# Patient Record
Sex: Female | Born: 1955 | Race: White | Hispanic: No | Marital: Married | Smoking: Never smoker
Health system: Southern US, Community
[De-identification: ages and names within clinical notes are randomized; demographics above are authoritative.]

## PROBLEM LIST (undated history)

## (undated) DIAGNOSIS — E785 Hyperlipidemia, unspecified: Secondary | ICD-10-CM

## (undated) DIAGNOSIS — I10 Essential (primary) hypertension: Secondary | ICD-10-CM

## (undated) DIAGNOSIS — B019 Varicella without complication: Secondary | ICD-10-CM

## (undated) DIAGNOSIS — N189 Chronic kidney disease, unspecified: Secondary | ICD-10-CM

## (undated) DIAGNOSIS — L0293 Carbuncle, unspecified: Secondary | ICD-10-CM

## (undated) DIAGNOSIS — L0292 Furuncle, unspecified: Secondary | ICD-10-CM

## (undated) DIAGNOSIS — N200 Calculus of kidney: Secondary | ICD-10-CM

## (undated) DIAGNOSIS — G709 Myoneural disorder, unspecified: Secondary | ICD-10-CM

## (undated) DIAGNOSIS — K219 Gastro-esophageal reflux disease without esophagitis: Secondary | ICD-10-CM

## (undated) DIAGNOSIS — E079 Disorder of thyroid, unspecified: Secondary | ICD-10-CM

## (undated) HISTORY — DX: Varicella without complication: B01.9

## (undated) HISTORY — PX: COSMETIC SURGERY: SHX468

## (undated) HISTORY — DX: Carbuncle, unspecified: L02.93

## (undated) HISTORY — DX: Essential (primary) hypertension: I10

## (undated) HISTORY — PX: BREAST SURGERY: SHX581

## (undated) HISTORY — DX: Disorder of thyroid, unspecified: E07.9

## (undated) HISTORY — PX: BIOPSY THYROID: PRO38

## (undated) HISTORY — PX: TUBAL LIGATION: SHX77

## (undated) HISTORY — PX: BREAST REDUCTION SURGERY: SHX8

## (undated) HISTORY — DX: Hyperlipidemia, unspecified: E78.5

## (undated) HISTORY — DX: Calculus of kidney: N20.0

## (undated) HISTORY — PX: ABDOMINAL HYSTERECTOMY: SHX81

## (undated) HISTORY — DX: Myoneural disorder, unspecified: G70.9

## (undated) HISTORY — DX: Gastro-esophageal reflux disease without esophagitis: K21.9

## (undated) HISTORY — DX: Furuncle, unspecified: L02.92

## (undated) HISTORY — PX: EYE SURGERY: SHX253

---

## 1996-01-13 HISTORY — PX: ABDOMINAL HYSTERECTOMY: SHX81

## 2009-12-24 ENCOUNTER — Ambulatory Visit: Payer: Self-pay | Admitting: Internal Medicine

## 2010-01-12 LAB — HM MAMMOGRAPHY

## 2011-09-08 ENCOUNTER — Encounter: Payer: Self-pay | Admitting: Internal Medicine

## 2011-09-08 ENCOUNTER — Other Ambulatory Visit: Payer: Self-pay | Admitting: Internal Medicine

## 2011-09-08 ENCOUNTER — Ambulatory Visit (INDEPENDENT_AMBULATORY_CARE_PROVIDER_SITE_OTHER): Payer: No Typology Code available for payment source | Admitting: Internal Medicine

## 2011-09-08 VITALS — BP 120/70 | HR 59 | Temp 98.7°F | Ht 68.5 in | Wt 220.0 lb

## 2011-09-08 DIAGNOSIS — E039 Hypothyroidism, unspecified: Secondary | ICD-10-CM | POA: Insufficient documentation

## 2011-09-08 DIAGNOSIS — E785 Hyperlipidemia, unspecified: Secondary | ICD-10-CM

## 2011-09-08 DIAGNOSIS — E782 Mixed hyperlipidemia: Secondary | ICD-10-CM | POA: Insufficient documentation

## 2011-09-08 DIAGNOSIS — E1140 Type 2 diabetes mellitus with diabetic neuropathy, unspecified: Secondary | ICD-10-CM | POA: Insufficient documentation

## 2011-09-08 DIAGNOSIS — E1149 Type 2 diabetes mellitus with other diabetic neurological complication: Secondary | ICD-10-CM

## 2011-09-08 DIAGNOSIS — E1142 Type 2 diabetes mellitus with diabetic polyneuropathy: Secondary | ICD-10-CM

## 2011-09-08 DIAGNOSIS — Z1239 Encounter for other screening for malignant neoplasm of breast: Secondary | ICD-10-CM

## 2011-09-08 DIAGNOSIS — L03211 Cellulitis of face: Secondary | ICD-10-CM

## 2011-09-08 DIAGNOSIS — IMO0001 Reserved for inherently not codable concepts without codable children: Secondary | ICD-10-CM

## 2011-09-08 DIAGNOSIS — IMO0002 Reserved for concepts with insufficient information to code with codable children: Secondary | ICD-10-CM

## 2011-09-08 DIAGNOSIS — L0201 Cutaneous abscess of face: Secondary | ICD-10-CM

## 2011-09-08 DIAGNOSIS — I10 Essential (primary) hypertension: Secondary | ICD-10-CM

## 2011-09-08 DIAGNOSIS — E1165 Type 2 diabetes mellitus with hyperglycemia: Secondary | ICD-10-CM | POA: Insufficient documentation

## 2011-09-08 MED ORDER — DOXYCYCLINE HYCLATE 100 MG PO TABS: 100.0000 mg | ORAL_TABLET | Freq: Two times a day (BID) | ORAL | Status: AC

## 2011-09-08 NOTE — Assessment & Plan Note (Signed)
Patient reports neuropathic pain in her legs. Most consistent with diabetic neuropathy. Encouraged her continued efforts at better compliance with diet and improved control of her blood sugars. Also discussed potentially using Neurontin, however she would like to hold off on this for now.

## 2011-09-08 NOTE — Assessment & Plan Note (Signed)
Blood pressure well-controlled on current medications. Will obtain records on recent renal function. Followup in one week.

## 2011-09-08 NOTE — Assessment & Plan Note (Addendum)
Continue pravastatin. Goal LDL less than 70. Will obtain records on previous lipids and LFTs.

## 2011-09-08 NOTE — Assessment & Plan Note (Signed)
Patient reports last hemoglobin A1c was 7.5%. Will obtain records on this. She reports recent better compliance with diet and weight loss of 13 pounds. Encouraged her to continue with this. Encouraged her to monitor sugars closely as she loses weight. Followup one week.

## 2011-09-08 NOTE — Progress Notes (Signed)
Subjective:    Patient ID: Christie Bryant, female    DOB: 1955-12-10, 56 y.o.   MRN: [id number]  HPI 56 year old female with history of diabetes, hypertension, hyperlipidemia presents to establish care. Her primary concern today is recurrent episodes of boils. She reports history of several boils on her legs and abdomen. These first began to occur approximately one year ago. 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 After that time, she developed recurrent boils. She has been treated with Bactrim and doxycycline with resolution of her symptoms. She reports that one boil was cultured and it was staph but she is not sure if it was MRSA. Over the last couple of days she has developed a boil behind her left ear. This area has been draining some purulent fluid. She has been applying bacitracin topically with some improvement in surrounding redness. She denies any fever or chills.  In regards to diabetes, she reports that recent A1c was elevated at 7.5%. She notes some dietary indiscretion the past. Over the last couple of months, she has started on Weight Watchers and has lost about 12 pounds. She reports that blood sugars have been improved, typically near 80 or 90 fasting. She reports full compliance with her medications.  In regards to hypertension, she reports that her endocrinologist recently reduced her dose of triamterene. She has had some dizziness and low blood pressures on this medication. She reports that symptoms have resolved and she is tolerating medication well. Next  She is also concerned about some burning pain in her feet. This is most bothersome at night. She has not had wounds on her feet. She is not currently taking any medication for this.  Outpatient Encounter Prescriptions as of 09/08/2011  Medication Sig Dispense Refill  . aspirin 81 MG tablet Take 81 mg by mouth daily.      . Cholecalciferol (VITAMIN D-3) 1000  UNITS CAPS Take 2 capsules by mouth daily.      Marland Kitchen doxycycline (VIBRA-TABS) 100 MG tablet Take 1 tablet (100 mg total) by mouth 2 (two) times daily.  60 tablet  0  . glimepiride (AMARYL) 4 MG tablet Take 4 mg by mouth 2 (two) times daily.      Marland Kitchen levothyroxine (SYNTHROID, LEVOTHROID) 75 MCG tablet Take 75 mcg by mouth daily.      . metFORMIN (GLUCOPHAGE-XR) 500 MG 24 hr tablet Take 500 mg by mouth 2 (two) times daily.      . metoprolol (LOPRESSOR) 100 MG tablet Take 100 mg by mouth 2 (two) times daily.      . Multiple Vitamin (MULTIVITAMIN) tablet Take 1 tablet by mouth daily.      . pravastatin (PRAVACHOL) 40 MG tablet Take 40 mg by mouth daily.      . sitaGLIPtin (JANUVIA) 100 MG tablet Take 100 mg by mouth daily.      Marland Kitchen triamterene-hydrochlorothiazide (MAXZIDE) 75-50 MG per tablet Take 0.5 tablets by mouth daily.      . vitamin C (ASCORBIC ACID) 500 MG tablet Take 500 mg by mouth daily.       BP 120/70  Pulse 59  Temp 98.7 F (37.1 C) (Oral)  Ht 5' 8.5" (1.74 m)  Wt 220 lb (99.791 kg)  BMI 32.96 kg/m2  SpO2 97%  Review of Systems  Constitutional: Negative for fever, chills, appetite change, fatigue and unexpected weight change.  HENT: Negative for ear pain, congestion, sore throat, trouble swallowing, neck pain, voice  change and sinus pressure.   Eyes: Negative for visual disturbance.  Respiratory: Negative for cough, shortness of breath, wheezing and stridor.   Cardiovascular: Negative for chest pain, palpitations and leg swelling.  Gastrointestinal: Negative for nausea, vomiting, abdominal pain, diarrhea, constipation, blood in stool, abdominal distention and anal bleeding.  Genitourinary: Negative for dysuria and flank pain.  Musculoskeletal: Positive for myalgias. Negative for arthralgias and gait problem.  Skin: Positive for color change and wound. Negative for rash.  Neurological: Negative for dizziness and headaches.  Hematological: Negative for adenopathy. Does not  bruise/bleed easily.  Psychiatric/Behavioral: Negative for suicidal ideas, disturbed wake/sleep cycle and dysphoric mood. The patient is not nervous/anxious.        Objective:   Physical Exam  Constitutional: She is oriented to person, place, and time. She appears well-developed and well-nourished. No distress.  HENT:  Head: Normocephalic and atraumatic.    Right Ear: External ear normal.  Left Ear: External ear normal.  Nose: Nose normal.  Mouth/Throat: Oropharynx is clear and moist. No oropharyngeal exudate.  Eyes: Conjunctivae are normal. Pupils are equal, round, and reactive to light. Right eye exhibits no discharge. Left eye exhibits no discharge. No scleral icterus.  Neck: Normal range of motion. Neck supple. No tracheal deviation present. No thyromegaly present.  Cardiovascular: Normal rate, regular rhythm, normal heart sounds and intact distal pulses.  Exam reveals no gallop and no friction rub.   No murmur heard. Pulmonary/Chest: Effort normal and breath sounds normal. No respiratory distress. She has no wheezes. She has no rales. She exhibits no tenderness.  Abdominal: Soft. Bowel sounds are normal. She exhibits no distension. There is no tenderness.  Musculoskeletal: Normal range of motion. She exhibits no edema and no tenderness.  Lymphadenopathy:    She has no cervical adenopathy.  Neurological: She is alert and oriented to person, place, and time. No cranial nerve deficit. She exhibits normal muscle tone. Coordination normal.  Skin: Skin is warm and dry. No rash noted. She is not diaphoretic. No erythema. No pallor.  Psychiatric: She has a normal mood and affect. Her behavior is normal. Judgment and thought content normal.          Assessment & Plan:

## 2011-09-08 NOTE — Assessment & Plan Note (Signed)
Will obtain records on recent thyroid function. Continue Synthroid. Followup next week.

## 2011-09-08 NOTE — Assessment & Plan Note (Signed)
Cellulitis of scalp posterior to left ear. Given history of recurrent boils this is most consistent with MRSA. Culture sent today. Will treat with doxycycline. Patient will followup in one week. She will call sooner if symptoms are not improving.

## 2011-09-08 NOTE — Assessment & Plan Note (Signed)
Screening mammogram ordered today ?

## 2011-09-10 ENCOUNTER — Encounter: Payer: Self-pay | Admitting: Internal Medicine

## 2011-09-11 LAB — WOUND CULTURE

## 2011-09-16 ENCOUNTER — Encounter: Payer: Self-pay | Admitting: Internal Medicine

## 2011-09-16 ENCOUNTER — Ambulatory Visit (INDEPENDENT_AMBULATORY_CARE_PROVIDER_SITE_OTHER): Payer: No Typology Code available for payment source | Admitting: Internal Medicine

## 2011-09-16 VITALS — BP 118/70 | HR 65 | Temp 98.7°F | Ht 68.5 in | Wt 217.8 lb

## 2011-09-16 DIAGNOSIS — L03211 Cellulitis of face: Secondary | ICD-10-CM

## 2011-09-16 DIAGNOSIS — A4902 Methicillin resistant Staphylococcus aureus infection, unspecified site: Secondary | ICD-10-CM

## 2011-09-16 DIAGNOSIS — L0201 Cutaneous abscess of face: Secondary | ICD-10-CM

## 2011-09-16 NOTE — Assessment & Plan Note (Signed)
Patient with history of MRSA and recurrent cellulitis and abscess. Will set up evaluation with ID to see if she might be a candidate for decontamination therapy.

## 2011-09-16 NOTE — Progress Notes (Signed)
Subjective:    Patient ID: Christie Bryant, female    DOB: 03/14/1955, 56 y.o.   MRN: [id number]  HPI 56 year old female with history of diabetes, hypertension, hyperlipidemia, and hypothyroidism presents for followup of abscess posterior to her left ear. At her last visit, she was started on doxycycline. Culture from the wound grew MRSA which was sensitive to doxycycline. She reports significant improvement in redness and pain as well as swelling at the site. She denies any fever or chills. She reports she is generally feeling well.  Outpatient Encounter Prescriptions as of 09/16/2011  Medication Sig Dispense Refill  . aspirin 81 MG tablet Take 81 mg by mouth daily.      . Cholecalciferol (VITAMIN D-3) 1000 UNITS CAPS Take 2 capsules by mouth daily.      Marland Kitchen doxycycline (VIBRA-TABS) 100 MG tablet Take 1 tablet (100 mg total) by mouth 2 (two) times daily.  60 tablet  0  . glimepiride (AMARYL) 4 MG tablet Take 4 mg by mouth 2 (two) times daily.      Marland Kitchen levothyroxine (SYNTHROID, LEVOTHROID) 75 MCG tablet Take 75 mcg by mouth daily.      . metFORMIN (GLUCOPHAGE-XR) 500 MG 24 hr tablet Take 500 mg by mouth 2 (two) times daily.      . metoprolol (LOPRESSOR) 100 MG tablet Take 100 mg by mouth 2 (two) times daily.      . Multiple Vitamin (MULTIVITAMIN) tablet Take 1 tablet by mouth daily.      . pravastatin (PRAVACHOL) 40 MG tablet Take 40 mg by mouth daily.      . sitaGLIPtin (JANUVIA) 100 MG tablet Take 100 mg by mouth daily.      Marland Kitchen triamterene-hydrochlorothiazide (MAXZIDE) 75-50 MG per tablet Take 0.5 tablets by mouth daily.      . vitamin C (ASCORBIC ACID) 500 MG tablet Take 500 mg by mouth daily.      Marland Kitchen sulfamethoxazole-trimethoprim (BACTRIM DS) 800-160 MG per tablet        BP 118/70  Pulse 65  Temp 98.7 F (37.1 C) (Oral)  Ht 5' 8.5" (1.74 m)  Wt 217 lb 12 oz (98.771 kg)  BMI 32.63 kg/m2  SpO2 95%  Review of Systems  Constitutional: Negative for fever, chills, appetite change, fatigue and  unexpected weight change.  HENT: Negative for ear pain, congestion, sore throat, trouble swallowing, neck pain, voice change and sinus pressure.   Eyes: Negative for visual disturbance.  Respiratory: Negative for cough, shortness of breath, wheezing and stridor.   Cardiovascular: Negative for chest pain, palpitations and leg swelling.  Gastrointestinal: Negative for nausea, vomiting, abdominal pain, diarrhea, constipation, blood in stool, abdominal distention and anal bleeding.  Genitourinary: Negative for dysuria and flank pain.  Musculoskeletal: Negative for myalgias, arthralgias and gait problem.  Skin: Positive for color change and wound. Negative for rash.  Neurological: Negative for dizziness and headaches.  Hematological: Negative for adenopathy. Does not bruise/bleed easily.  Psychiatric/Behavioral: Negative for suicidal ideas, disturbed wake/sleep cycle and dysphoric mood. The patient is not nervous/anxious.        Objective:   Physical Exam  Constitutional: She is oriented to person, place, and time. She appears well-developed and well-nourished. No distress.  HENT:  Head: Normocephalic and atraumatic.    Right Ear: External ear normal.  Left Ear: External ear normal.  Nose: Nose normal.  Mouth/Throat: Oropharynx is clear and moist. No oropharyngeal exudate.  Eyes: Conjunctivae are normal. Pupils are equal, round, and reactive to light. Right eye exhibits  no discharge. Left eye exhibits no discharge. No scleral icterus.  Neck: Normal range of motion. Neck supple. No tracheal deviation present. No thyromegaly present.  Cardiovascular: Normal rate, regular rhythm, normal heart sounds and intact distal pulses.  Exam reveals no gallop and no friction rub.   No murmur heard. Pulmonary/Chest: Effort normal and breath sounds normal. No respiratory distress. She has no wheezes. She has no rales. She exhibits no tenderness.  Musculoskeletal: Normal range of motion. She exhibits no  edema and no tenderness.  Lymphadenopathy:    She has no cervical adenopathy.  Neurological: She is alert and oriented to person, place, and time. No cranial nerve deficit. She exhibits normal muscle tone. Coordination normal.  Skin: Skin is warm and dry. No rash noted. She is not diaphoretic. No erythema. No pallor.  Psychiatric: She has a normal mood and affect. Her behavior is normal. Judgment and thought content normal.          Assessment & Plan:

## 2011-09-16 NOTE — Assessment & Plan Note (Signed)
Symptoms of cellulitis and abscess or markedly improved on antibiotics. Culture showed MRSA which was sensitive to both doxycycline and Bactrim. We'll plan to continue for another full week of antibiotics. She will call if symptoms are recurrent.

## 2011-09-21 ENCOUNTER — Encounter: Payer: Self-pay | Admitting: Internal Medicine

## 2011-10-20 ENCOUNTER — Encounter: Payer: Self-pay | Admitting: Internal Medicine

## 2011-12-28 ENCOUNTER — Encounter: Payer: Self-pay | Admitting: Internal Medicine

## 2011-12-28 ENCOUNTER — Ambulatory Visit (INDEPENDENT_AMBULATORY_CARE_PROVIDER_SITE_OTHER): Payer: No Typology Code available for payment source | Admitting: Internal Medicine

## 2011-12-28 VITALS — BP 128/76 | HR 59 | Temp 98.0°F | Resp 16 | Wt 197.5 lb

## 2011-12-28 DIAGNOSIS — B029 Zoster without complications: Secondary | ICD-10-CM | POA: Insufficient documentation

## 2011-12-28 MED ORDER — HYDROCODONE-ACETAMINOPHEN 5-325 MG PO TABS: 1.0000 | ORAL_TABLET | Freq: Three times a day (TID) | ORAL | Status: DC | PRN

## 2011-12-28 MED ORDER — VALACYCLOVIR HCL 1 G PO TABS: 1000.0000 mg | ORAL_TABLET | Freq: Three times a day (TID) | ORAL | Status: DC

## 2011-12-28 MED ORDER — GABAPENTIN 100 MG PO CAPS: 100.0000 mg | ORAL_CAPSULE | Freq: Three times a day (TID) | ORAL | Status: DC

## 2011-12-28 NOTE — Patient Instructions (Signed)

## 2011-12-28 NOTE — Assessment & Plan Note (Signed)
Symptoms and exam are consistent with herpes zoster. Will treat with Valtrex given onset of symptoms less than 72 hours. Will use Neurontin for pain. We discussed that we will need to gradually titrate dose of this medication. Will also use hydrocodone as needed for severe pain. Patient will e-mail with update later this week. Followup in 2 weeks.

## 2011-12-28 NOTE — Progress Notes (Signed)
Subjective:    Patient ID: Christie Bryant, female    DOB: January 01, 1956, 56 y.o.   MRN: [id number]  HPI 56 year old female with history of diabetes, hyperlipidemia, hypothyroidism, hypertension presents for acute visit complaining of rash over her right upper abdomen. She reports that she first developed pain on Friday. She was concerned that her right upper quadrant abdominal pain might be related to gallbladder dysfunction. However, over the next 24 hours, she developed a red, raised, painful rash in this area. The rash extends from the midline around her right abdomen across her right upper back. It is described as incredibly painful, like a knife stabbing her. She has not taken any medication for this. She also reports some general malaise and myalgia. She denies any fever, chills.  Outpatient Encounter Prescriptions as of 12/28/2011  Medication Sig Dispense Refill  . aspirin 81 MG tablet Take 81 mg by mouth daily.      . Cholecalciferol (VITAMIN D-3) 1000 UNITS CAPS Take 2 capsules by mouth daily.      Marland Kitchen glimepiride (AMARYL) 4 MG tablet Take 4 mg by mouth 2 (two) times daily.      Marland Kitchen levothyroxine (SYNTHROID, LEVOTHROID) 75 MCG tablet Take 75 mcg by mouth daily.      . metFORMIN (GLUCOPHAGE-XR) 500 MG 24 hr tablet Take 500 mg by mouth 2 (two) times daily.      . metoprolol (LOPRESSOR) 100 MG tablet Take 100 mg by mouth 2 (two) times daily.      . Multiple Vitamin (MULTIVITAMIN) tablet Take 1 tablet by mouth daily.      . pravastatin (PRAVACHOL) 40 MG tablet Take 40 mg by mouth daily.      . sitaGLIPtin (JANUVIA) 100 MG tablet Take 100 mg by mouth daily.      Marland Kitchen sulfamethoxazole-trimethoprim (BACTRIM DS) 800-160 MG per tablet       . triamterene-hydrochlorothiazide (MAXZIDE) 75-50 MG per tablet Take 0.5 tablets by mouth daily.      . vitamin C (ASCORBIC ACID) 500 MG tablet Take 500 mg by mouth daily.      Marland Kitchen gabapentin (NEURONTIN) 100 MG capsule Take 1 capsule (100 mg total) by mouth 3 (three) times  daily.  90 capsule  3    Review of Systems  Constitutional: Positive for fatigue. Negative for fever, chills, appetite change and unexpected weight change.  HENT: Negative for ear pain, congestion, sore throat, trouble swallowing, neck pain, voice change and sinus pressure.   Eyes: Negative for visual disturbance.  Respiratory: Negative for cough, shortness of breath, wheezing and stridor.   Cardiovascular: Negative for chest pain, palpitations and leg swelling.  Gastrointestinal: Positive for abdominal pain. Negative for nausea, vomiting, diarrhea, constipation, blood in stool, abdominal distention and anal bleeding.  Genitourinary: Negative for dysuria and flank pain.  Musculoskeletal: Positive for myalgias. Negative for arthralgias and gait problem.  Skin: Positive for color change and rash.  Neurological: Negative for dizziness and headaches.  Hematological: Negative for adenopathy. Does not bruise/bleed easily.  Psychiatric/Behavioral: Negative for suicidal ideas, sleep disturbance and dysphoric mood. The patient is not nervous/anxious.        Objective:   Physical Exam  Constitutional: She is oriented to person, place, and time. She appears well-developed and well-nourished. No distress.  HENT:  Head: Normocephalic and atraumatic.  Right Ear: External ear normal.  Left Ear: External ear normal.  Nose: Nose normal.  Mouth/Throat: Oropharynx is clear and moist. No oropharyngeal exudate.  Eyes: Conjunctivae normal are normal. Pupils are  equal, round, and reactive to light. Right eye exhibits no discharge. Left eye exhibits no discharge. No scleral icterus.  Neck: Normal range of motion. Neck supple. No tracheal deviation present. No thyromegaly present.  Pulmonary/Chest: Effort normal.  Abdominal: There is tenderness.    Musculoskeletal: Normal range of motion. She exhibits no edema and no tenderness.  Lymphadenopathy:    She has no cervical adenopathy.  Neurological: She is  alert and oriented to person, place, and time. No cranial nerve deficit. She exhibits normal muscle tone. Coordination normal.  Skin: Skin is warm and dry. Rash noted. Rash is vesicular. She is not diaphoretic. No erythema. No pallor.  Psychiatric: She has a normal mood and affect. Her behavior is normal. Judgment and thought content normal.          Assessment & Plan:

## 2012-01-11 ENCOUNTER — Encounter: Payer: Self-pay | Admitting: Internal Medicine

## 2012-01-12 ENCOUNTER — Ambulatory Visit: Payer: No Typology Code available for payment source | Admitting: Internal Medicine

## 2012-02-27 ENCOUNTER — Other Ambulatory Visit: Payer: Self-pay

## 2012-07-26 ENCOUNTER — Encounter: Payer: Self-pay | Admitting: Internal Medicine

## 2012-08-02 ENCOUNTER — Telehealth: Payer: Self-pay | Admitting: Internal Medicine

## 2012-08-02 NOTE — Telephone Encounter (Signed)
Fwd to Dr. Walker 

## 2012-08-02 NOTE — Telephone Encounter (Signed)
Pt asking for permission to make an appointment for her Christie Bryant to see Dr. Dan Humphreys as a new pt.  Christie Bryant has been diagnosed with adult ADHD; has no other medical issues.  His Dr. office closed, so he needs a new PCP pretty quick because he is about out of medication.  Please advise.

## 2012-08-02 NOTE — Telephone Encounter (Signed)
That is fine. We can put him in next slot.

## 2012-08-03 NOTE — Telephone Encounter (Signed)
Appt has been scheduled 7/25 for [facility]" [place] 7/25.  [place] stated he would run out of his wellbutrin w/in the next week.

## 2012-08-03 NOTE — Telephone Encounter (Signed)
Noted  

## 2012-09-14 ENCOUNTER — Encounter: Payer: Self-pay | Admitting: Internal Medicine

## 2012-11-17 ENCOUNTER — Other Ambulatory Visit: Payer: Self-pay

## 2012-11-20 LAB — HM DIABETES EYE EXAM

## 2012-11-21 ENCOUNTER — Encounter: Payer: Self-pay | Admitting: Internal Medicine

## 2012-11-23 ENCOUNTER — Encounter: Payer: Self-pay | Admitting: Internal Medicine

## 2012-11-23 MED ORDER — METOPROLOL TARTRATE 100 MG PO TABS: 100.0000 mg | ORAL_TABLET | Freq: Two times a day (BID) | ORAL | Status: DC

## 2012-12-05 ENCOUNTER — Ambulatory Visit (INDEPENDENT_AMBULATORY_CARE_PROVIDER_SITE_OTHER): Payer: No Typology Code available for payment source | Admitting: Internal Medicine

## 2012-12-05 ENCOUNTER — Encounter: Payer: Self-pay | Admitting: Internal Medicine

## 2012-12-05 VITALS — BP 112/66 | HR 58 | Temp 98.2°F | Resp 12 | Ht 69.0 in | Wt 213.0 lb

## 2012-12-05 DIAGNOSIS — Z0000 Encounter for general adult medical examination without abnormal findings: Secondary | ICD-10-CM

## 2012-12-05 LAB — LIPID PANEL
Cholesterol: 203 mg/dL — ABNORMAL HIGH (ref 0–200)
HDL: 47.1 mg/dL (ref >39.00)
Triglycerides: 131 mg/dL (ref 0.0–149.0)

## 2012-12-05 LAB — COMPREHENSIVE METABOLIC PANEL
ALT: 26 U/L (ref 0–35)
AST: 21 U/L (ref 0–37)
Albumin: 4 g/dL (ref 3.5–5.2)
Alkaline Phosphatase: 80 U/L (ref 39–117)
BUN: 21 mg/dL (ref 6–23)
CO2: 28 mEq/L (ref 19–32)
Calcium: 9.9 mg/dL (ref 8.4–10.5)
Chloride: 103 mEq/L (ref 96–112)
Creatinine, Ser: 0.9 mg/dL (ref 0.4–1.2)
GFR: 71.72 mL/min (ref >60.00)
Potassium: 4.5 mEq/L (ref 3.5–5.1)
Total Bilirubin: 0.9 mg/dL (ref 0.3–1.2)

## 2012-12-05 LAB — CBC WITH DIFFERENTIAL/PLATELET
Basophils Relative: 0.3 % (ref 0.0–3.0)
Eosinophils Absolute: 0.5 10*3/uL (ref 0.0–0.7)
HCT: 41.8 % (ref 36.0–46.0)
Hemoglobin: 14.4 g/dL (ref 12.0–15.0)
Lymphocytes Relative: 28.9 % (ref 12.0–46.0)
Lymphs Abs: 2.3 10*3/uL (ref 0.7–4.0)
MCHC: 34.5 g/dL (ref 30.0–36.0)
Monocytes Absolute: 0.5 10*3/uL (ref 0.1–1.0)
Monocytes Relative: 6.8 % (ref 3.0–12.0)
Neutro Abs: 4.7 10*3/uL (ref 1.4–7.7)
RBC: 4.42 Mil/uL (ref 3.87–5.11)

## 2012-12-05 LAB — HEMOGLOBIN A1C: Hgb A1c MFr Bld: 7.3 % — ABNORMAL HIGH (ref 4.6–6.5)

## 2012-12-05 LAB — LDL CHOLESTEROL, DIRECT: Direct LDL: 142.6 mg/dL

## 2012-12-05 LAB — TSH: TSH: 7.91 u[IU]/mL — ABNORMAL HIGH (ref 0.35–5.50)

## 2012-12-05 NOTE — Progress Notes (Signed)
Pre visit review using our clinic review tool, if applicable. No additional management support is needed unless otherwise documented below in the visit note. 

## 2012-12-05 NOTE — Assessment & Plan Note (Signed)
General medical exam including breast and pelvic exam normal today. PAP pending. Mammogram ordered. Immunizations are UTD except for Zostavax, which pt declines at this time. Will check basic labs including CMP, CBC, TSH, A1c. Encouraged healthy diet and regular exercise. Follow up 3 months and prn.

## 2012-12-05 NOTE — Patient Instructions (Signed)

## 2012-12-05 NOTE — Progress Notes (Signed)
Subjective:    Patient ID: Christie Bryant, female    DOB: Jun 29, 1955, 57 y.o.   MRN: [id number]  HPI 57YO female with DM, HTN, hypothyroidism presents for annual exam. She is generally feeling well. She notes decreased compliance with healthy diet recently secondary to pending move. She did not bring record of her BP today. She is compliant with medications.  Outpatient Encounter Prescriptions as of 12/05/2012  Medication Sig  . aspirin 325 MG tablet Take 325 mg by mouth daily.  . Cholecalciferol (VITAMIN D-3) 1000 UNITS CAPS Take 2 capsules by mouth daily.  Marland Kitchen docusate sodium (COLACE) 100 MG capsule Take 100 mg by mouth 2 (two) times daily.  Marland Kitchen glimepiride (AMARYL) 4 MG tablet Take 4 mg by mouth 2 (two) times daily.  Marland Kitchen levothyroxine (SYNTHROID, LEVOTHROID) 75 MCG tablet Take 75 mcg by mouth daily.  . metFORMIN (GLUCOPHAGE-XR) 500 MG 24 hr tablet Take 1,000 mg by mouth 2 (two) times daily.   . metoprolol (LOPRESSOR) 100 MG tablet Take 1 tablet (100 mg total) by mouth 2 (two) times daily.  . pravastatin (PRAVACHOL) 40 MG tablet Take 40 mg by mouth daily.  Marland Kitchen triamterene-hydrochlorothiazide (MAXZIDE) 75-50 MG per tablet Take 0.5 tablets by mouth daily.   BP 112/66  Pulse 58  Temp(Src) 98.2 F (36.8 C) (Oral)  Resp 12  Ht 5\' 9"  (1.753 m)  Wt 213 lb (96.616 kg)  BMI 31.44 kg/m2  SpO2 95%  Review of Systems  Constitutional: Negative for fever, chills, appetite change, fatigue and unexpected weight change.  HENT: Negative for congestion, ear pain, sinus pressure, sore throat, trouble swallowing and voice change.   Eyes: Negative for visual disturbance.  Respiratory: Negative for cough, shortness of breath, wheezing and stridor.   Cardiovascular: Negative for chest pain, palpitations and leg swelling.  Gastrointestinal: Negative for nausea, vomiting, abdominal pain, diarrhea, constipation, blood in stool, abdominal distention and anal bleeding.  Genitourinary: Negative for dysuria and flank  pain.  Musculoskeletal: Negative for arthralgias, gait problem, myalgias and neck pain.  Skin: Negative for color change and rash.  Neurological: Negative for dizziness and headaches.  Hematological: Negative for adenopathy. Does not bruise/bleed easily.  Psychiatric/Behavioral: Negative for suicidal ideas, sleep disturbance and dysphoric mood. The patient is not nervous/anxious.        Objective:   Physical Exam  Constitutional: She is oriented to person, place, and time. She appears well-developed and well-nourished. No distress.  HENT:  Head: Normocephalic and atraumatic.  Right Ear: External ear normal.  Left Ear: External ear normal.  Nose: Nose normal.  Mouth/Throat: Oropharynx is clear and moist. No oropharyngeal exudate.  Eyes: Conjunctivae are normal. Pupils are equal, round, and reactive to light. Right eye exhibits no discharge. Left eye exhibits no discharge. No scleral icterus.  Neck: Normal range of motion. Neck supple. No tracheal deviation present. No thyromegaly present.  Cardiovascular: Normal rate, regular rhythm, normal heart sounds and intact distal pulses.  Exam reveals no gallop and no friction rub.   No murmur heard. Pulmonary/Chest: Effort normal and breath sounds normal. No accessory muscle usage. Not tachypneic. No respiratory distress. She has no decreased breath sounds. She has no wheezes. She has no rhonchi. She has no rales. She exhibits no tenderness.    Abdominal: Soft. Bowel sounds are normal. She exhibits no distension and no mass. There is no tenderness. There is no rebound and no guarding.  Genitourinary: Rectum normal and vagina normal. No breast swelling, tenderness, discharge or bleeding. Pelvic exam was performed  with patient supine. There is no rash, tenderness or lesion on the right labia. There is no rash, tenderness or lesion on the left labia. Right adnexum displays no mass, no tenderness and no fullness. Left adnexum displays no mass, no  tenderness and no fullness. No erythema or tenderness around the vagina. No vaginal discharge found.  Cervix and uterus surgically absent  Musculoskeletal: Normal range of motion. She exhibits no edema and no tenderness.  Lymphadenopathy:    She has no cervical adenopathy.  Neurological: She is alert and oriented to person, place, and time. No cranial nerve deficit. She exhibits normal muscle tone. Coordination normal.  Skin: Skin is warm and dry. No rash noted. She is not diaphoretic. No erythema. No pallor.  Psychiatric: She has a normal mood and affect. Her behavior is normal. Judgment and thought content normal.          Assessment & Plan:

## 2012-12-06 ENCOUNTER — Other Ambulatory Visit (HOSPITAL_COMMUNITY)
Admission: RE | Admit: 2012-12-06 | Discharge: 2012-12-06 | Disposition: A | Discharge status: Home / Self Care | Payer: No Typology Code available for payment source | Source: Ambulatory Visit | Attending: Internal Medicine | Admitting: Internal Medicine

## 2012-12-06 ENCOUNTER — Encounter: Payer: Self-pay | Admitting: Internal Medicine

## 2012-12-06 ENCOUNTER — Other Ambulatory Visit: Payer: Self-pay | Admitting: Internal Medicine

## 2012-12-06 DIAGNOSIS — Z01419 Encounter for gynecological examination (general) (routine) without abnormal findings: Secondary | ICD-10-CM | POA: Insufficient documentation

## 2012-12-06 DIAGNOSIS — E039 Hypothyroidism, unspecified: Secondary | ICD-10-CM

## 2012-12-06 DIAGNOSIS — Z1151 Encounter for screening for human papillomavirus (HPV): Secondary | ICD-10-CM | POA: Insufficient documentation

## 2012-12-06 MED ORDER — LEVOTHYROXINE SODIUM 100 MCG PO TABS: 100.0000 ug | ORAL_TABLET | Freq: Every day | ORAL | Status: DC

## 2012-12-06 NOTE — Addendum Note (Signed)
Addended by: Montine Circle D on: 12/06/2012 10:49 AM   Modules accepted: Orders

## 2012-12-26 ENCOUNTER — Other Ambulatory Visit: Payer: Self-pay | Admitting: Internal Medicine

## 2013-01-09 ENCOUNTER — Other Ambulatory Visit: Payer: No Typology Code available for payment source

## 2013-01-13 ENCOUNTER — Other Ambulatory Visit: Payer: Self-pay | Admitting: *Deleted

## 2013-01-13 ENCOUNTER — Telehealth: Payer: Self-pay | Admitting: *Deleted

## 2013-01-13 DIAGNOSIS — E039 Hypothyroidism, unspecified: Secondary | ICD-10-CM

## 2013-01-13 NOTE — Telephone Encounter (Signed)
TSH and free t4 for 244.9

## 2013-01-13 NOTE — Telephone Encounter (Signed)
Pt is coming in for labs 01.05.2015 what labs and dx? 

## 2013-01-16 ENCOUNTER — Other Ambulatory Visit: Payer: Self-pay | Admitting: *Deleted

## 2013-01-16 ENCOUNTER — Other Ambulatory Visit (INDEPENDENT_AMBULATORY_CARE_PROVIDER_SITE_OTHER): Payer: No Typology Code available for payment source

## 2013-01-16 ENCOUNTER — Encounter: Payer: Self-pay | Admitting: Internal Medicine

## 2013-01-16 DIAGNOSIS — E039 Hypothyroidism, unspecified: Secondary | ICD-10-CM

## 2013-01-16 LAB — TSH: TSH: 6.96 u[IU]/mL — ABNORMAL HIGH (ref 0.35–5.50)

## 2013-01-16 LAB — T4, FREE: Free T4: 0.95 ng/dL (ref 0.60–1.60)

## 2013-01-16 MED ORDER — LEVOTHYROXINE SODIUM 112 MCG PO TABS: 112.0000 ug | ORAL_TABLET | Freq: Every day | ORAL | Status: DC

## 2013-01-16 NOTE — Telephone Encounter (Signed)
Prescription sent to pharmacy per patient Mychart message.

## 2013-01-23 ENCOUNTER — Encounter: Payer: Self-pay | Admitting: Internal Medicine

## 2013-01-23 MED ORDER — METFORMIN HCL ER 500 MG PO TB24: 1000.0000 mg | ORAL_TABLET | Freq: Two times a day (BID) | ORAL | Status: DC

## 2013-03-13 ENCOUNTER — Encounter: Payer: Self-pay | Admitting: Internal Medicine

## 2013-03-13 MED ORDER — DOXYCYCLINE HYCLATE 100 MG PO TABS: 100.0000 mg | ORAL_TABLET | Freq: Two times a day (BID) | ORAL | Status: DC

## 2013-03-16 ENCOUNTER — Ambulatory Visit: Payer: Self-pay | Admitting: General Surgery

## 2013-03-16 ENCOUNTER — Encounter: Payer: Self-pay | Admitting: Internal Medicine

## 2013-03-16 ENCOUNTER — Ambulatory Visit (INDEPENDENT_AMBULATORY_CARE_PROVIDER_SITE_OTHER): Payer: No Typology Code available for payment source | Admitting: General Surgery

## 2013-03-16 ENCOUNTER — Encounter: Payer: Self-pay | Admitting: General Surgery

## 2013-03-16 ENCOUNTER — Ambulatory Visit (INDEPENDENT_AMBULATORY_CARE_PROVIDER_SITE_OTHER): Payer: No Typology Code available for payment source | Admitting: Internal Medicine

## 2013-03-16 VITALS — BP 110/70 | HR 64 | Temp 98.2°F | Ht 69.0 in | Wt 213.8 lb

## 2013-03-16 VITALS — BP 120/74 | HR 64 | Temp 98.8°F | Resp 12 | Ht 69.0 in | Wt 213.0 lb

## 2013-03-16 DIAGNOSIS — I10 Essential (primary) hypertension: Secondary | ICD-10-CM

## 2013-03-16 DIAGNOSIS — Z01810 Encounter for preprocedural cardiovascular examination: Secondary | ICD-10-CM

## 2013-03-16 DIAGNOSIS — L03317 Cellulitis of buttock: Secondary | ICD-10-CM

## 2013-03-16 DIAGNOSIS — IMO0002 Reserved for concepts with insufficient information to code with codable children: Secondary | ICD-10-CM

## 2013-03-16 DIAGNOSIS — E1165 Type 2 diabetes mellitus with hyperglycemia: Secondary | ICD-10-CM

## 2013-03-16 DIAGNOSIS — A4902 Methicillin resistant Staphylococcus aureus infection, unspecified site: Secondary | ICD-10-CM

## 2013-03-16 DIAGNOSIS — L0231 Cutaneous abscess of buttock: Secondary | ICD-10-CM | POA: Insufficient documentation

## 2013-03-16 DIAGNOSIS — IMO0001 Reserved for inherently not codable concepts without codable children: Secondary | ICD-10-CM

## 2013-03-16 DIAGNOSIS — E039 Hypothyroidism, unspecified: Secondary | ICD-10-CM

## 2013-03-16 LAB — CBC WITH DIFFERENTIAL/PLATELET
BASOS PCT: 0.3 % (ref 0.0–3.0)
Basophils Absolute: 0 10*3/uL (ref 0.0–0.1)
EOS PCT: 2.7 % (ref 0.0–5.0)
Eosinophils Absolute: 0.2 10*3/uL (ref 0.0–0.7)
HEMATOCRIT: 40.8 % (ref 36.0–46.0)
HEMOGLOBIN: 13.8 g/dL (ref 12.0–15.0)
Lymphocytes Relative: 23.6 % (ref 12.0–46.0)
Lymphs Abs: 1.8 10*3/uL (ref 0.7–4.0)
MCHC: 34 g/dL (ref 30.0–36.0)
MCV: 96.7 fl (ref 78.0–100.0)
Monocytes Absolute: 0.8 10*3/uL (ref 0.1–1.0)
Monocytes Relative: 9.7 % (ref 3.0–12.0)
Neutro Abs: 5 10*3/uL (ref 1.4–7.7)
Neutrophils Relative %: 63.7 % (ref 43.0–77.0)
Platelets: 163 10*3/uL (ref 150.0–400.0)
RBC: 4.22 Mil/uL (ref 3.87–5.11)
RDW: 13.6 % (ref 11.5–14.6)
WBC: 7.8 10*3/uL (ref 4.5–10.5)

## 2013-03-16 LAB — LIPID PANEL
Cholesterol: 147 mg/dL (ref 0–200)
HDL: 42.3 mg/dL (ref >39.00)
LDL Cholesterol: 88 mg/dL (ref 0–99)
TRIGLYCERIDES: 82 mg/dL (ref 0.0–149.0)
Total CHOL/HDL Ratio: 3
VLDL: 16.4 mg/dL (ref 0.0–40.0)

## 2013-03-16 LAB — COMPREHENSIVE METABOLIC PANEL
ALK PHOS: 74 U/L (ref 39–117)
ALT: 14 U/L (ref 0–35)
AST: 13 U/L (ref 0–37)
Albumin: 3.4 g/dL — ABNORMAL LOW (ref 3.5–5.2)
BUN: 12 mg/dL (ref 6–23)
CO2: 23 mEq/L (ref 19–32)
Calcium: 9.5 mg/dL (ref 8.4–10.5)
Chloride: 102 mEq/L (ref 96–112)
Creatinine, Ser: 0.9 mg/dL (ref 0.4–1.2)
GFR: 64.66 mL/min (ref >60.00)
Glucose, Bld: 266 mg/dL — ABNORMAL HIGH (ref 70–99)
POTASSIUM: 4.3 meq/L (ref 3.5–5.1)
SODIUM: 134 meq/L — AB (ref 135–145)
TOTAL PROTEIN: 7.6 g/dL (ref 6.0–8.3)
Total Bilirubin: 1.4 mg/dL — ABNORMAL HIGH (ref 0.3–1.2)

## 2013-03-16 LAB — MICROALBUMIN / CREATININE URINE RATIO
Creatinine,U: 144.6 mg/dL
Microalb Creat Ratio: 3.5 mg/g (ref 0.0–30.0)
Microalb, Ur: 5.1 mg/dL — ABNORMAL HIGH (ref 0.0–1.9)

## 2013-03-16 LAB — HEMOGLOBIN A1C: Hgb A1c MFr Bld: 10.7 % — ABNORMAL HIGH (ref 4.6–6.5)

## 2013-03-16 LAB — TSH: TSH: 6.71 u[IU]/mL — ABNORMAL HIGH (ref 0.35–5.50)

## 2013-03-16 MED ORDER — DOXYCYCLINE HYCLATE 100 MG PO TABS: 100.0000 mg | ORAL_TABLET | Freq: Two times a day (BID) | ORAL | Status: DC

## 2013-03-16 NOTE — Assessment & Plan Note (Signed)
History of MRSA infection. Current gluteal abscess. Surgical referral placed for incision and drainage. Will continue Doxycycline. Follow up 1 week.

## 2013-03-16 NOTE — Progress Notes (Signed)
Subjective:    Patient ID: Christie Bryant, female    DOB: 12/23/1955, 58 y.o.   MRN: [id number]  HPI 58YO female presents for follow up DM  Concerned about large nodular area on right buttock for 1 week. No fever, chills. Taking Doxycycline with no improvement.  DM - BG have been very high in 300s for several weeks.  Review of Systems  Constitutional: Negative for fever, chills, appetite change, fatigue and unexpected weight change.  HENT: Negative for congestion, ear pain, sinus pressure, sore throat, trouble swallowing and voice change.   Eyes: Negative for visual disturbance.  Respiratory: Negative for cough, shortness of breath, wheezing and stridor.   Cardiovascular: Negative for chest pain, palpitations and leg swelling.  Gastrointestinal: Negative for nausea, vomiting, abdominal pain, diarrhea, constipation, blood in stool, abdominal distention and anal bleeding.  Genitourinary: Negative for dysuria and flank pain.  Musculoskeletal: Negative for arthralgias, gait problem, myalgias and neck pain.  Skin: Positive for color change and rash.  Neurological: Negative for dizziness and headaches.  Hematological: Negative for adenopathy. Does not bruise/bleed easily.  Psychiatric/Behavioral: Negative for suicidal ideas, sleep disturbance and dysphoric mood. The patient is not nervous/anxious.        Objective:    BP 110/70  Pulse 64  Temp(Src) 98.2 F (36.8 C) (Oral)  Ht 5\' 9"  (1.753 m)  Wt 213 lb 12 oz (96.956 kg)  BMI 31.55 kg/m2  SpO2 94% Physical Exam  Constitutional: She is oriented to person, place, and time. She appears well-developed and well-nourished. No distress.  HENT:  Head: Normocephalic and atraumatic.  Right Ear: External ear normal.  Left Ear: External ear normal.  Nose: Nose normal.  Mouth/Throat: Oropharynx is clear and moist. No oropharyngeal exudate.  Eyes: Conjunctivae are normal. Pupils are equal, round, and reactive to light. Right eye exhibits no  discharge. Left eye exhibits no discharge. No scleral icterus.  Neck: Normal range of motion. Neck supple. No tracheal deviation present. No thyromegaly present.  Cardiovascular: Normal rate, regular rhythm, normal heart sounds and intact distal pulses.  Exam reveals no gallop and no friction rub.   No murmur heard. Pulmonary/Chest: Effort normal and breath sounds normal. No accessory muscle usage. Not tachypneic. No respiratory distress. She has no decreased breath sounds. She has no wheezes. She has no rhonchi. She has no rales. She exhibits no tenderness.  Musculoskeletal: Normal range of motion. She exhibits no edema and no tenderness.  Lymphadenopathy:    She has no cervical adenopathy.  Neurological: She is alert and oriented to person, place, and time. No cranial nerve deficit. She exhibits normal muscle tone. Coordination normal.  Skin: Skin is warm and dry. Rash noted. Rash is pustular. She is not diaphoretic. No erythema. No pallor.     Psychiatric: She has a normal mood and affect. Her behavior is normal. Judgment and thought content normal.          Assessment & Plan:   Problem List Items Addressed This Visit   Cellulitis and abscess of buttock     Exam consistent with large abscess right buttock. Discussed with Dr. Evette Cristal from general surgery who will see pt today for incision and drainage. Continue Doxycycline. Pt will call if any fever, chills. We discussed that she may need admission for IV antibiotics if fever, chills, worsening redness or pain. Follow up 1 week and prn.    Relevant Orders      Ambulatory referral to General Surgery   Diabetes mellitus type 2,  uncontrolled - Primary     BG elevated per pt report. Likely elevated in setting of recent infection. Will check A1c with labs. Continue Metformin and Glimepiride for now.    Relevant Orders      Comprehensive metabolic panel      Hemoglobin A1c      Lipid panel      Microalbumin / creatinine urine ratio    Hypertension      BP Readings from Last 3 Encounters:  03/16/13 120/74  03/16/13 110/70  12/05/12 112/66   BP well controlled on current medicatoins. Will continue.    Hypothyroidism     Will check TSH with labs. Continue Levothyroxine.    Relevant Orders      TSH   MRSA infection     History of MRSA infection. Current gluteal abscess. Surgical referral placed for incision and drainage. Will continue Doxycycline. Follow up 1 week.    Relevant Medications      doxycycline (VIBRA-TABS) 100 MG tablet   Other Relevant Orders      CBC with Differential       Return in about 1 week (around 03/23/2013).

## 2013-03-16 NOTE — Patient Instructions (Signed)
Patient to be scheduled for gluteal abscess surgery.

## 2013-03-16 NOTE — Progress Notes (Signed)
Pre-visit discussion using our clinic review tool. No additional management support is needed unless otherwise documented below in the visit note.  

## 2013-03-16 NOTE — Progress Notes (Signed)
Done

## 2013-03-16 NOTE — Assessment & Plan Note (Signed)
BP Readings from Last 3 Encounters:  03/16/13 120/74  03/16/13 110/70  12/05/12 112/66   BP well controlled on current medicatoins. Will continue.

## 2013-03-16 NOTE — Assessment & Plan Note (Signed)
Exam consistent with large abscess right buttock. Discussed with Dr. Evette CristalSankar from general surgery who will see pt today for incision and drainage. Continue Doxycycline. Pt will call if any fever, chills. We discussed that she may need admission for IV antibiotics if fever, chills, worsening redness or pain. Follow up 1 week and prn.

## 2013-03-16 NOTE — Progress Notes (Signed)
Patient ID: Christie Kollererry T Verrill, female   DOB: 05-14-1955, 58 y.o.   MRN: [id number]  Chief Complaint  Patient presents with  . Other    Gluteal abscess    HPI Christie Bryant is a 58 y.o. female who presents for an evaluation of a right gluteal abscess. The patient states she noticed it last weekend. She states it is painful and large in size. She is on an antibiotic that she started on Monday. The patient states it is feeling a little better since antibiotics have been started. Has history of similar problem in past - MRSA.  HPI  Past Medical History  Diagnosis Date  . Chicken pox   . Hypertension   . Hyperlipidemia   . Kidney stone   . Thyroid disease   . Recurrent boils   . Diabetes mellitus 2012    Dr. Tedd SiasSolum    Past Surgical History  Procedure Laterality Date  . Biopsy thyroid      normal, Dr. Tedd SiasSolum  . Tubal ligation    . Breast reduction surgery    . Abdominal hysterectomy  1998    complete    Family History  Problem Relation Age of Onset  . Cancer Mother     ovarian/uterus  . Hyperlipidemia Father   . Heart disease Father   . Hypertension Father   . Cancer Father     throat  . Stroke Maternal Grandmother   . Hypertension Maternal Grandmother   . Stroke Maternal Grandfather   . Hypertension Maternal Grandfather   . Hypertension Other   . Heart disease Other   . Sudden death Other   . Diabetes Other   . Hypertension Sister   . Hypertension Brother   . Hypertension Sister   . Hypertension Sister     Social History History  Substance Use Topics  . Smoking status: Never Smoker   . Smokeless tobacco: Not on file  . Alcohol Use: No    No Known Allergies  Current Outpatient Prescriptions  Medication Sig Dispense Refill  . aspirin 325 MG tablet Take 325 mg by mouth daily.      . Cholecalciferol (VITAMIN D-3) 1000 UNITS CAPS Take 2 capsules by mouth daily.      Marland Kitchen. docusate sodium (COLACE) 100 MG capsule Take 100 mg by mouth 2 (two) times daily.      Marland Kitchen.  doxycycline (VIBRA-TABS) 100 MG tablet Take 1 tablet (100 mg total) by mouth 2 (two) times daily.  60 tablet  1  . glimepiride (AMARYL) 4 MG tablet Take 4 mg by mouth 2 (two) times daily.      Marland Kitchen. levothyroxine (SYNTHROID, LEVOTHROID) 112 MCG tablet Take 1 tablet (112 mcg total) by mouth daily.  90 tablet  3  . metFORMIN (GLUCOPHAGE-XR) 500 MG 24 hr tablet Take 2 tablets (1,000 mg total) by mouth 2 (two) times daily.  120 tablet  5  . metoprolol (LOPRESSOR) 100 MG tablet TAKE ONE TABLET BY MOUTH TWICE DAILY  60 tablet  5  . pravastatin (PRAVACHOL) 40 MG tablet Take 40 mg by mouth daily.      Marland Kitchen. triamterene-hydrochlorothiazide (MAXZIDE) 75-50 MG per tablet Take 0.5 tablets by mouth daily.       No current facility-administered medications for this visit.    Review of Systems Review of Systems  Constitutional: Negative.   Respiratory: Negative.   Cardiovascular: Negative.     Blood pressure 120/74, pulse 64, temperature 98.8 F (37.1 C), temperature source Oral, resp. rate  12, height 5\' 9"  (1.753 m), weight 213 lb (96.616 kg).  Physical Exam Physical Exam  Constitutional: She is oriented to person, place, and time. She appears well-developed and well-nourished.  Eyes: Conjunctivae are normal. No scleral icterus.  Cardiovascular: Normal rate, regular rhythm and normal heart sounds.   No murmur heard. Pulmonary/Chest: Effort normal and breath sounds normal.  Abdominal: Soft. Normal appearance and bowel sounds are normal. There is no tenderness.  Genitourinary:  Both gluteal areas show multiple healed subcutaneous abscesses in the past. Right gluteal area in the lower outer aspect has a 9 x 7 cm swelling with redness,induration, and tenderness.   Neurological: She is alert and oriented to person, place, and time.  Skin: Skin is warm and dry.    Data Reviewed   Assessment    Large right gluteal abscess.    Plan    Feel this is better drained under anesthesia. Discussed fully with  patient she is agreeable.        SANKAR,SEEPLAPUTHUR G 03/16/2013, 9:34 AM

## 2013-03-16 NOTE — Assessment & Plan Note (Signed)
BG elevated per pt report. Likely elevated in setting of recent infection. Will check A1c with labs. Continue Metformin and Glimepiride for now.

## 2013-03-16 NOTE — Assessment & Plan Note (Signed)
Will check TSH with labs. Continue Levothyroxine. 

## 2013-03-17 ENCOUNTER — Ambulatory Visit: Payer: Self-pay | Admitting: General Surgery

## 2013-03-17 DIAGNOSIS — L0231 Cutaneous abscess of buttock: Secondary | ICD-10-CM

## 2013-03-17 DIAGNOSIS — L03317 Cellulitis of buttock: Secondary | ICD-10-CM

## 2013-03-17 HISTORY — PX: OTHER SURGICAL HISTORY: SHX169

## 2013-03-20 ENCOUNTER — Encounter: Payer: Self-pay | Admitting: General Surgery

## 2013-03-21 ENCOUNTER — Encounter: Payer: Self-pay | Admitting: General Surgery

## 2013-03-21 LAB — WOUND CULTURE

## 2013-03-27 ENCOUNTER — Ambulatory Visit (INDEPENDENT_AMBULATORY_CARE_PROVIDER_SITE_OTHER): Payer: No Typology Code available for payment source | Admitting: General Surgery

## 2013-03-27 ENCOUNTER — Encounter: Payer: Self-pay | Admitting: General Surgery

## 2013-03-27 VITALS — BP 120/78 | HR 76 | Resp 12 | Ht 70.0 in | Wt 213.0 lb

## 2013-03-27 DIAGNOSIS — L0231 Cutaneous abscess of buttock: Secondary | ICD-10-CM

## 2013-03-27 DIAGNOSIS — L03317 Cellulitis of buttock: Secondary | ICD-10-CM

## 2013-03-27 NOTE — Patient Instructions (Addendum)
The patient is aware to call back for any questions or concerns. Patient to return as needed. 

## 2013-03-27 NOTE — Progress Notes (Signed)
Patient ID: Christie Bryant, female   DOB: Sep 09, 1955, 58 y.o.   MRN: [id number]  The patient presents for a 10 day folllow up post gluteal abscess drainage. The procedure was performed on 03/17/13. The patient states she has some soreness but overall doing well. Is not currently taking pain medication that was prescribed.   The abscess in right gluteal area is markedly improved. There is no redness only mild induration. Significantly reduced in size. No active drainage.

## 2013-04-17 ENCOUNTER — Encounter: Payer: Self-pay | Admitting: Internal Medicine

## 2013-05-08 ENCOUNTER — Encounter: Payer: Self-pay | Admitting: Internal Medicine

## 2013-05-08 ENCOUNTER — Ambulatory Visit (INDEPENDENT_AMBULATORY_CARE_PROVIDER_SITE_OTHER): Payer: No Typology Code available for payment source | Admitting: Internal Medicine

## 2013-05-08 VITALS — BP 126/70 | HR 59 | Temp 98.4°F | Resp 14 | Wt 219.5 lb

## 2013-05-08 DIAGNOSIS — L309 Dermatitis, unspecified: Secondary | ICD-10-CM | POA: Insufficient documentation

## 2013-05-08 DIAGNOSIS — I10 Essential (primary) hypertension: Secondary | ICD-10-CM

## 2013-05-08 DIAGNOSIS — IMO0002 Reserved for concepts with insufficient information to code with codable children: Secondary | ICD-10-CM

## 2013-05-08 DIAGNOSIS — IMO0001 Reserved for inherently not codable concepts without codable children: Secondary | ICD-10-CM

## 2013-05-08 DIAGNOSIS — L259 Unspecified contact dermatitis, unspecified cause: Secondary | ICD-10-CM

## 2013-05-08 DIAGNOSIS — E1165 Type 2 diabetes mellitus with hyperglycemia: Secondary | ICD-10-CM

## 2013-05-08 DIAGNOSIS — A4902 Methicillin resistant Staphylococcus aureus infection, unspecified site: Secondary | ICD-10-CM

## 2013-05-08 MED ORDER — CANAGLIFLOZIN 100 MG PO TABS: 100.0000 mg | ORAL_TABLET | Freq: Every day | ORAL | Status: DC

## 2013-05-08 NOTE — Assessment & Plan Note (Signed)
Rash is most consistent with contact dermatitis, however unclear allergen. Will set up dermatology evaluation.

## 2013-05-08 NOTE — Patient Instructions (Addendum)
Start Invokana 100mg  daily.  Check labs on Thursday.  Canagliflozin oral tablets What is this medicine? CANAGLIFLOZIN (KAN a gli FLOE zin) helps to treat type 2 diabetes. It helps to control blood sugar. Treatment is combined with diet and exercise. This medicine may be used for other purposes; ask your health care provider or pharmacist if you have questions. COMMON BRAND NAME(S): Invokana What should I tell my health care provider before I take this medicine? They need to know if you have any of these conditions: -dehydration -diabetic ketoacidosis -diet low in salt -high cholesterol -high levels of potassium in the blood -history of yeast infection of the penis or vagina -kidney disease -liver disease -low blood pressure -on hemodialysis -type 1 diabetes -uncircumcised female -an unusual or allergic reaction to canagliflozin, other medicines, foods, dyes, or preservatives -pregnant or trying to get pregnant -breast-feeding How should I use this medicine? Take this medicine by mouth with a glass of water. Follow the directions on the prescription label. Take it before the first meal of the day. Take your dose at the same time each day. Do not take more often than directed. Do not stop taking except on your doctor's advice. A special MedGuide will be given to you by the pharmacist with each prescription and refill. Be sure to read this information carefully each time. Talk to your pediatrician regarding the use of this medicine in children. Special care may be needed. Overdosage: If you think you've taken too much of this medicine contact a poison control center or emergency room at once. Overdosage: If you think you have taken too much of this medicine contact a poison control center or emergency room at once. NOTE: This medicine is only for you. Do not share this medicine with others. What if I miss a dose? If you miss a dose, take it as soon as you can. If it is almost time for your  next dose, take only that dose. Do not take double or extra doses. What may interact with this medicine? Do not take this medicine with any of the following medications: -gatifloxacinThis medicine may also interact with the following medications: -alcohol -certain medicines for blood pressure, heart disease -digoxin -diuretics -insulin -nateglinide -phenobarbital -phenytoin -repaglinide -rifampin -ritonavir -sulfonylureas like glimepiride, glipizide, glyburide This list may not describe all possible interactions. Give your health care provider a list of all the medicines, herbs, non-prescription drugs, or dietary supplements you use. Also tell them if you smoke, drink alcohol, or use illegal drugs. Some items may interact with your medicine. What should I watch for while using this medicine? Visit your doctor or health care professional for regular checks on your progress. A test called the HbA1C (A1C) will be monitored. This is a simple blood test. It measures your blood sugar control over the last 2 to 3 months. You will receive this test every 3 to 6 months. Learn how to check your blood sugar. Learn the symptoms of low and high blood sugar and how to manage them. Always carry a quick-source of sugar with you in case you have symptoms of low blood sugar. Examples include hard sugar candy or glucose tablets. Make sure others know that you can choke if you eat or drink when you develop serious symptoms of low blood sugar, such as seizures or unconsciousness. They must get medical help at once. Tell your doctor or health care professional if you have high blood sugar. You might need to change the dose of your medicine.  If you are sick or exercising more than usual, you might need to change the dose of your medicine. Do not skip meals. Ask your doctor or health care professional if you should avoid alcohol. Many nonprescription cough and cold products contain sugar or alcohol. These can affect  blood sugar. Wear a medical ID bracelet or chain, and carry a card that describes your disease and details of your medicine and dosage times. What side effects may I notice from receiving this medicine? Side effects that you should report to your doctor or health care professional as soon as possible: -allergic reactions like skin rash, itching or hives, swelling of the face, lips, or tongue -breathing problems -chest pain -dizziness -fast or irregular heartbeat -feeling faint or lightheaded, falls -fever, chills -muscle weakness -signs and symptoms of low blood sugar such as feeling anxious, confusion, dizziness, increased hunger, unusually weak or tired, sweating, shakiness, cold, irritable, headache, blurred vision, fast heartbeat, loss of consciousness -trouble passing urine or change in the amount of urine -penile discharge, itching, or pain in men -vaginal discharge, itching, or odor in women  Side effects that usually do not require medical attention (Report these to your doctor or health care professional if they continue or are bothersome.): -constipation -increased urination -nausea -thirsty This list may not describe all possible side effects. Call your doctor for medical advice about side effects. You may report side effects to FDA at 1-800-FDA-1088. Where should I keep my medicine? Keep out of the reach of children. Store at room temperature between 20 and 25 degrees C (68 and 77 degrees F). Throw away any unused medicine after the expiration date. NOTE: This sheet is a summary. It may not cover all possible information. If you have questions about this medicine, talk to your doctor, pharmacist, or health care provider.  2014, Elsevier/Gold Standard. (2012-04-13 14:08:06)

## 2013-05-08 NOTE — Assessment & Plan Note (Signed)
Recent gluteal abscess resolved after incision and drainage. Given recurrent abscess, will set up ID evaluation. Question if she would benefit from long term use of medication such as Doxycycline for suppression.

## 2013-05-08 NOTE — Progress Notes (Signed)
Pre visit review using our clinic review tool, if applicable. No additional management support is needed unless otherwise documented below in the visit note. 

## 2013-05-08 NOTE — Assessment & Plan Note (Signed)
BG elevated. Will try change to Invokana. Will start 100mg  daily. Discussed potential risks of this medication. Will check BMP on Thursday. Will stop Glimepiride and Triamterene. Follow up in 4 weeks or sooner as needed. Plan recheck A1c in 06/2013.

## 2013-05-08 NOTE — Assessment & Plan Note (Signed)
BP Readings from Last 3 Encounters:  05/08/13 126/70  03/27/13 120/78  03/16/13 120/74   BP well controlled on current medication. Will stop Triamterene given risk of hyperkalemia on Invokana.

## 2013-05-08 NOTE — Progress Notes (Signed)
Subjective:    Patient ID: Christie Bryant, female    DOB: [birth year], 58 y.o.   MRN: [id number]  HPI 57YO female presents for follow up.  DM - BG running in 300s. Compliant with meds, but notes some dietary indiscretion.  Gluteal abscess - s/p IandD with Dr. Evette CristalSankar. Drainage has stopped. No fever, chills.  Rash - noted for 4 months. Over legs and lower abdomen. Red and itchy. No new lotions, soaps, detergents. Not applying anything to rash. Husband with similar rash.  Review of Systems  Constitutional: Negative for fever, chills, appetite change, fatigue and unexpected weight change.  HENT: Negative for congestion, ear pain, sinus pressure, sore throat, trouble swallowing and voice change.   Eyes: Negative for visual disturbance.  Respiratory: Negative for cough, shortness of breath, wheezing and stridor.   Cardiovascular: Negative for chest pain, palpitations and leg swelling.  Gastrointestinal: Negative for nausea, vomiting, abdominal pain, diarrhea, constipation, blood in stool, abdominal distention and anal bleeding.  Genitourinary: Negative for dysuria and flank pain.  Musculoskeletal: Negative for arthralgias, gait problem, myalgias and neck pain.  Skin: Positive for color change and rash.  Neurological: Negative for dizziness and headaches.  Hematological: Negative for adenopathy. Does not bruise/bleed easily.  Psychiatric/Behavioral: Negative for suicidal ideas, sleep disturbance and dysphoric mood. The patient is not nervous/anxious.        Objective:    BP 126/70  Pulse 59  Temp(Src) 98.4 F (36.9 C) (Oral)  Resp 14  Wt 219 lb 8 oz (99.565 kg)  SpO2 97% Physical Exam  Constitutional: She is oriented to person, place, and time. She appears well-developed and well-nourished. No distress.  HENT:  Head: Normocephalic and atraumatic.  Right Ear: External ear normal.  Left Ear: External ear normal.  Nose: Nose normal.  Mouth/Throat: Oropharynx is clear and moist. No  oropharyngeal exudate.  Eyes: Conjunctivae are normal. Pupils are equal, round, and reactive to light. Right eye exhibits no discharge. Left eye exhibits no discharge. No scleral icterus.  Neck: Normal range of motion. Neck supple. No tracheal deviation present. No thyromegaly present.  Cardiovascular: Normal rate, regular rhythm, normal heart sounds and intact distal pulses.  Exam reveals no gallop and no friction rub.   No murmur heard. Pulmonary/Chest: Effort normal and breath sounds normal. No accessory muscle usage. Not tachypneic. No respiratory distress. She has no decreased breath sounds. She has no wheezes. She has no rhonchi. She has no rales. She exhibits no tenderness.  Musculoskeletal: Normal range of motion. She exhibits no edema and no tenderness.  Lymphadenopathy:    She has no cervical adenopathy.  Neurological: She is alert and oriented to person, place, and time. No cranial nerve deficit. She exhibits normal muscle tone. Coordination normal.  Skin: Skin is warm and dry. Rash (erythematous over legs and lower abdomen.) noted. Rash is maculopapular. She is not diaphoretic. No erythema. No pallor.  Psychiatric: She has a normal mood and affect. Her behavior is normal. Judgment and thought content normal.          Assessment & Plan:   Problem List Items Addressed This Visit   Dermatitis     Rash is most consistent with contact dermatitis, however unclear allergen. Will set up dermatology evaluation.    Relevant Orders      Ambulatory referral to Dermatology   Diabetes mellitus type 2, uncontrolled - Primary     BG elevated. Will try change to Invokana. Will start 100mg  daily. Discussed potential risks of this medication.  Will check BMP on Thursday. Will stop Glimepiride and Triamterene. Follow up in 4 weeks or sooner as needed. Plan recheck A1c in 06/2013.    Relevant Medications      Canagliflozin (INVOKANA) 100 MG TABS   Hypertension      BP Readings from Last 3  Encounters:  05/08/13 126/70  03/27/13 120/78  03/16/13 120/74   BP well controlled on current medication. Will stop Triamterene given risk of hyperkalemia on Invokana.    Relevant Orders      Basic Metabolic Panel (BMET)   MRSA infection     Recent gluteal abscess resolved after incision and drainage. Given recurrent abscess, will set up ID evaluation. Question if she would benefit from long term use of medication such as Doxycycline for suppression.    Relevant Orders      Ambulatory referral to Infectious Disease       Return in about 4 weeks (around 06/05/2013) for Recheck of Diabetes.

## 2013-05-09 ENCOUNTER — Telehealth: Payer: Self-pay | Admitting: Internal Medicine

## 2013-05-09 NOTE — Telephone Encounter (Signed)
Relevant patient education assigned to patient using Emmi. ° °

## 2013-05-10 ENCOUNTER — Encounter: Payer: Self-pay | Admitting: Internal Medicine

## 2013-05-12 ENCOUNTER — Telehealth: Payer: Self-pay

## 2013-05-12 NOTE — Telephone Encounter (Signed)
[facility] [employer] [phone], faxing form to office

## 2013-05-12 NOTE — Telephone Encounter (Signed)
Relevant patient education assigned to patient using Emmi. ° °

## 2013-05-16 ENCOUNTER — Telehealth: Payer: Self-pay | Admitting: Internal Medicine

## 2013-05-16 NOTE — Telephone Encounter (Signed)
Prior auth needed for [employer]

## 2013-05-17 ENCOUNTER — Encounter: Payer: Self-pay | Admitting: Internal Medicine

## 2013-05-17 DIAGNOSIS — Z0279 Encounter for issue of other medical certificate: Secondary | ICD-10-CM

## 2013-05-17 NOTE — Telephone Encounter (Signed)
PA request form placed in Dr.walker box

## 2013-05-19 ENCOUNTER — Telehealth: Payer: Self-pay | Admitting: *Deleted

## 2013-05-19 NOTE — Telephone Encounter (Signed)
Received Prior authorization for medication Invokana, [facility] completed [facility]

## 2013-06-19 ENCOUNTER — Other Ambulatory Visit: Payer: Self-pay | Admitting: Internal Medicine

## 2013-07-11 ENCOUNTER — Encounter: Payer: Self-pay | Admitting: Internal Medicine

## 2013-07-12 MED ORDER — TRIAMTERENE-HCTZ 75-50 MG PO TABS: 0.5000 | ORAL_TABLET | Freq: Every day | ORAL | Status: DC

## 2013-07-24 ENCOUNTER — Encounter: Payer: Self-pay | Admitting: Internal Medicine

## 2013-07-24 MED ORDER — METFORMIN HCL ER 500 MG PO TB24: 1000.0000 mg | ORAL_TABLET | Freq: Two times a day (BID) | ORAL | Status: DC

## 2013-08-09 ENCOUNTER — Telehealth: Payer: Self-pay | Admitting: *Deleted

## 2013-08-09 NOTE — Telephone Encounter (Signed)
[facility] 4mg  tab, one tablet PO BID #[id number].  Review of chart shows this medication not on current med list.  Please advise in Dr Sonny DandyWalkers absence

## 2013-08-10 ENCOUNTER — Encounter: Payer: Self-pay | Admitting: Internal Medicine

## 2013-08-10 ENCOUNTER — Telehealth: Payer: Self-pay | Admitting: *Deleted

## 2013-08-10 NOTE — Telephone Encounter (Signed)
Please see MyChart email message from pt., and my response to her.

## 2013-08-10 NOTE — Telephone Encounter (Signed)
Please call pt to find out when she ran out of invokana. She needs A1c checked along with DM follow up.

## 2013-08-10 NOTE — Telephone Encounter (Signed)
Pt still has Invokana  Notified pt that she will need to schedule a lab appt this week and a follow up appt asap

## 2013-08-10 NOTE — Telephone Encounter (Signed)
Please schedule diabetes f/u with Dr. Dan HumphreysWalker

## 2013-08-11 NOTE — Telephone Encounter (Signed)
Pt was sent mychart message yesterday regarding need for appt.

## 2013-08-30 ENCOUNTER — Encounter: Payer: Self-pay | Admitting: *Deleted

## 2013-10-16 ENCOUNTER — Other Ambulatory Visit: Payer: Self-pay | Admitting: Internal Medicine

## 2013-11-09 ENCOUNTER — Other Ambulatory Visit (INDEPENDENT_AMBULATORY_CARE_PROVIDER_SITE_OTHER): Payer: No Typology Code available for payment source

## 2013-11-09 DIAGNOSIS — I10 Essential (primary) hypertension: Secondary | ICD-10-CM

## 2013-11-09 LAB — BASIC METABOLIC PANEL
BUN: 14 mg/dL (ref 6–23)
CHLORIDE: 103 meq/L (ref 96–112)
CO2: 26 meq/L (ref 19–32)
CREATININE: 1 mg/dL (ref 0.4–1.2)
Calcium: 9.5 mg/dL (ref 8.4–10.5)
GFR: 59.39 mL/min — ABNORMAL LOW (ref >60.00)
Glucose, Bld: 211 mg/dL — ABNORMAL HIGH (ref 70–99)
Potassium: 4.3 mEq/L (ref 3.5–5.1)
Sodium: 138 mEq/L (ref 135–145)

## 2013-11-13 ENCOUNTER — Encounter: Payer: Self-pay | Admitting: Internal Medicine

## 2013-11-20 ENCOUNTER — Encounter: Payer: Self-pay | Admitting: Internal Medicine

## 2013-11-20 ENCOUNTER — Ambulatory Visit (INDEPENDENT_AMBULATORY_CARE_PROVIDER_SITE_OTHER): Payer: No Typology Code available for payment source | Admitting: Internal Medicine

## 2013-11-20 VITALS — BP 108/64 | HR 60 | Temp 98.3°F | Ht 69.0 in | Wt 217.5 lb

## 2013-11-20 DIAGNOSIS — I10 Essential (primary) hypertension: Secondary | ICD-10-CM

## 2013-11-20 DIAGNOSIS — E039 Hypothyroidism, unspecified: Secondary | ICD-10-CM

## 2013-11-20 DIAGNOSIS — Z1239 Encounter for other screening for malignant neoplasm of breast: Secondary | ICD-10-CM

## 2013-11-20 DIAGNOSIS — IMO0002 Reserved for concepts with insufficient information to code with codable children: Secondary | ICD-10-CM

## 2013-11-20 DIAGNOSIS — E1165 Type 2 diabetes mellitus with hyperglycemia: Secondary | ICD-10-CM

## 2013-11-20 LAB — COMPREHENSIVE METABOLIC PANEL
ALBUMIN: 3.5 g/dL (ref 3.5–5.2)
ALT: 20 U/L (ref 0–35)
AST: 20 U/L (ref 0–37)
Alkaline Phosphatase: 64 U/L (ref 39–117)
BUN: 18 mg/dL (ref 6–23)
CALCIUM: 9.8 mg/dL (ref 8.4–10.5)
CO2: 22 meq/L (ref 19–32)
CREATININE: 1 mg/dL (ref 0.4–1.2)
Chloride: 105 mEq/L (ref 96–112)
GFR: 57.41 mL/min — AB (ref >60.00)
Glucose, Bld: 209 mg/dL — ABNORMAL HIGH (ref 70–99)
POTASSIUM: 4.5 meq/L (ref 3.5–5.1)
Sodium: 140 mEq/L (ref 135–145)
Total Bilirubin: 1 mg/dL (ref 0.2–1.2)
Total Protein: 7.5 g/dL (ref 6.0–8.3)

## 2013-11-20 LAB — LIPID PANEL
Cholesterol: 199 mg/dL (ref 0–200)
HDL: 37.2 mg/dL — ABNORMAL LOW (ref >39.00)
LDL Cholesterol: 132 mg/dL — ABNORMAL HIGH (ref 0–99)
NonHDL: 161.8
Total CHOL/HDL Ratio: 5
Triglycerides: 151 mg/dL — ABNORMAL HIGH (ref 0.0–149.0)
VLDL: 30.2 mg/dL (ref 0.0–40.0)

## 2013-11-20 LAB — MICROALBUMIN / CREATININE URINE RATIO
CREATININE, U: 75.8 mg/dL
Microalb Creat Ratio: 1.1 mg/g (ref 0.0–30.0)
Microalb, Ur: 0.8 mg/dL (ref 0.0–1.9)

## 2013-11-20 LAB — HM DIABETES FOOT EXAM: HM Diabetic Foot Exam: NORMAL

## 2013-11-20 LAB — HEMOGLOBIN A1C: Hgb A1c MFr Bld: 8.4 % — ABNORMAL HIGH (ref 4.6–6.5)

## 2013-11-20 MED ORDER — SAXAGLIPTIN HCL 2.5 MG PO TABS: 2.5000 mg | ORAL_TABLET | Freq: Every day | ORAL | Status: DC

## 2013-11-20 MED ORDER — PRAVASTATIN SODIUM 40 MG PO TABS: 40.0000 mg | ORAL_TABLET | Freq: Every day | ORAL | Status: DC

## 2013-11-20 NOTE — Assessment & Plan Note (Signed)
Will check A1c with labs. Start Onglyza 2.5mg  daily. Continue Metformin and Glimepiride. Referral placed to endocrinology.

## 2013-11-20 NOTE — Assessment & Plan Note (Signed)
BP Readings from Last 3 Encounters:  11/20/13 108/64  05/08/13 126/70  03/27/13 120/78   BP well controlled. Renal function with labs today. Consider adding ACEi at next visit.

## 2013-11-20 NOTE — Progress Notes (Signed)
Subjective:    Patient ID: Christie Bryant, female    DOB: 01/01/1956, 58 y.o.   MRN: [id number]  HPI 58YO female presents for follow up.  DM - Tried the Invokana. No improvement. BG almost always over 200. Frustrated by this. Compliant with medications. Trying to follow healthy diet.  Compliant with other medications.  No chest pain, headache, fatigue. Generally feeling well. Had flu shot at work.  Review of Systems  Constitutional: Negative for fever, chills, appetite change, fatigue and unexpected weight change.  Eyes: Negative for visual disturbance.  Respiratory: Negative for shortness of breath.   Cardiovascular: Negative for chest pain and leg swelling.  Gastrointestinal: Negative for nausea, vomiting, abdominal pain, diarrhea and constipation.  Musculoskeletal: Negative for myalgias and arthralgias.  Skin: Negative for color change and rash.  Hematological: Negative for adenopathy. Does not bruise/bleed easily.  Psychiatric/Behavioral: Negative for dysphoric mood. The patient is not nervous/anxious.        Objective:    BP 108/64 mmHg  Pulse 60  Temp(Src) 98.3 F (36.8 C) (Oral)  Ht 5\' 9"  (1.753 m)  Wt 217 lb 8 oz (98.657 kg)  BMI 32.10 kg/m2  SpO2 94% Physical Exam  Constitutional: She is oriented to person, place, and time. She appears well-developed and well-nourished. No distress.  HENT:  Head: Normocephalic and atraumatic.  Right Ear: External ear normal.  Left Ear: External ear normal.  Nose: Nose normal.  Mouth/Throat: Oropharynx is clear and moist. No oropharyngeal exudate.  Eyes: Conjunctivae are normal. Pupils are equal, round, and reactive to light. Right eye exhibits no discharge. Left eye exhibits no discharge. No scleral icterus.  Neck: Normal range of motion. Neck supple. No tracheal deviation present. No thyromegaly present.  Cardiovascular: Normal rate, regular rhythm, normal heart sounds and intact distal pulses.  Exam reveals no gallop and no  friction rub.   No murmur heard. Pulmonary/Chest: Effort normal and breath sounds normal. No accessory muscle usage. No tachypnea. No respiratory distress. She has no decreased breath sounds. She has no wheezes. She has no rhonchi. She has no rales. She exhibits no tenderness.  Musculoskeletal: Normal range of motion. She exhibits no edema or tenderness.  Lymphadenopathy:    She has no cervical adenopathy.  Neurological: She is alert and oriented to person, place, and time. No cranial nerve deficit. She exhibits normal muscle tone. Coordination normal.  Skin: Skin is warm and dry. No rash noted. She is not diaphoretic. No erythema. No pallor.  Psychiatric: She has a normal mood and affect. Her behavior is normal. Judgment and thought content normal.          Assessment & Plan:   Problem List Items Addressed This Visit      Unprioritized   Diabetes mellitus type 2, uncontrolled - Primary    Will check A1c with labs. Start Onglyza 2.5mg  daily. Continue Metformin and Glimepiride. Referral placed to endocrinology.    Relevant Medications      glimepiride (AMARYL) 2 MG tablet      saxagliptin HCl (ONGLYZA) 2.5 MG TABS tablet      pravastatin (PRAVACHOL) tablet   Other Relevant Orders      Ambulatory referral to Endocrinology      Comprehensive metabolic panel      Hemoglobin A1c      Lipid panel      Microalbumin / creatinine urine ratio   Hypertension    BP Readings from Last 3 Encounters:  11/20/13 108/64  05/08/13 126/70  03/27/13  120/78   BP well controlled. Renal function with labs today. Consider adding ACEi at next visit.    Relevant Medications      pravastatin (PRAVACHOL) tablet   Hypothyroidism    Continue Levothyroxine.    Screening for breast cancer   Relevant Orders      MM Digital Screening       Return in about 3 months (around 02/20/2014) for Recheck of Diabetes.

## 2013-11-20 NOTE — Patient Instructions (Signed)
Start Onglyza 2.5mg  daily in the morning with breakfast.  Call if any blood sugars below 70.  Follow up in 3 months.

## 2013-11-20 NOTE — Progress Notes (Signed)
Pre visit review using our clinic review tool, if applicable. No additional management support is needed unless otherwise documented below in the visit note. 

## 2013-11-20 NOTE — Assessment & Plan Note (Signed)
Continue Levothyroxine 

## 2013-11-30 ENCOUNTER — Ambulatory Visit: Payer: No Typology Code available for payment source | Admitting: Endocrinology

## 2013-12-18 ENCOUNTER — Other Ambulatory Visit: Payer: Self-pay | Admitting: Internal Medicine

## 2013-12-20 ENCOUNTER — Ambulatory Visit: Payer: No Typology Code available for payment source | Admitting: Endocrinology

## 2013-12-21 LAB — HM MAMMOGRAPHY

## 2013-12-25 ENCOUNTER — Other Ambulatory Visit: Payer: Self-pay | Admitting: Internal Medicine

## 2013-12-25 ENCOUNTER — Encounter: Payer: Self-pay | Admitting: Internal Medicine

## 2013-12-25 MED ORDER — DOXYCYCLINE HYCLATE 100 MG PO TABS: 100.0000 mg | ORAL_TABLET | Freq: Two times a day (BID) | ORAL | Status: DC

## 2014-01-08 ENCOUNTER — Other Ambulatory Visit: Payer: Self-pay | Admitting: Internal Medicine

## 2014-01-15 ENCOUNTER — Other Ambulatory Visit: Payer: Self-pay | Admitting: Internal Medicine

## 2014-01-22 ENCOUNTER — Other Ambulatory Visit: Payer: Self-pay | Admitting: Internal Medicine

## 2014-01-22 LAB — BASIC METABOLIC PANEL
BUN: 16 mg/dL (ref 4–21)
Creatinine: 1 mg/dL (ref 0.5–1.1)
GLUCOSE: 312 mg/dL
Potassium: 4 mmol/L (ref 3.4–5.3)
SODIUM: 135 mmol/L — AB (ref 137–147)

## 2014-01-22 LAB — HEMOGLOBIN A1C: Hgb A1c MFr Bld: 9.4 % — AB (ref 4.0–6.0)

## 2014-01-31 ENCOUNTER — Ambulatory Visit: Payer: No Typology Code available for payment source | Admitting: Endocrinology

## 2014-02-14 DIAGNOSIS — E039 Hypothyroidism, unspecified: Secondary | ICD-10-CM | POA: Insufficient documentation

## 2014-02-15 ENCOUNTER — Encounter: Payer: Self-pay | Admitting: *Deleted

## 2014-02-19 ENCOUNTER — Other Ambulatory Visit: Payer: Self-pay | Admitting: Internal Medicine

## 2014-03-05 ENCOUNTER — Other Ambulatory Visit: Payer: Self-pay | Admitting: Internal Medicine

## 2014-03-13 ENCOUNTER — Encounter: Payer: Self-pay | Admitting: Internal Medicine

## 2014-03-13 ENCOUNTER — Ambulatory Visit (INDEPENDENT_AMBULATORY_CARE_PROVIDER_SITE_OTHER): Payer: No Typology Code available for payment source | Admitting: Internal Medicine

## 2014-03-13 VITALS — BP 125/77 | HR 63 | Temp 97.9°F | Ht 69.0 in | Wt 221.4 lb

## 2014-03-13 DIAGNOSIS — L0201 Cutaneous abscess of face: Secondary | ICD-10-CM

## 2014-03-13 DIAGNOSIS — L03211 Cellulitis of face: Secondary | ICD-10-CM

## 2014-03-13 MED ORDER — CLINDAMYCIN HCL 300 MG PO CAPS: 300.0000 mg | ORAL_CAPSULE | Freq: Three times a day (TID) | ORAL | Status: DC

## 2014-03-13 MED ORDER — GENTAMICIN SULFATE 0.1 % EX OINT
1.0000 | TOPICAL_OINTMENT | Freq: Three times a day (TID) | CUTANEOUS | Status: DC
Start: 2014-03-13 — End: 2014-07-06

## 2014-03-13 NOTE — Progress Notes (Signed)
   Subjective:    Patient ID: Christie Bryant, female    DOB: 1955-10-20, 59 y.o.   MRN: [id number]  HPI  59YO female presents for acute visit.  Has h/o MRSA and DM  Noted scratch above eye on Saturday. No fever. Redness has spread around left eye and area over eye. No visual changes. No pain with movement of eye. No fever, chills. Not taking anything for this.  Past medical, surgical, family and social history per today's encounter.  Review of Systems  Constitutional: Negative for fever, chills and fatigue.  Eyes: Negative for pain and visual disturbance.  Respiratory: Negative for shortness of breath.   Skin: Positive for color change and wound.       Objective:    BP 125/77 mmHg  Pulse 63  Temp(Src) 97.9 F (36.6 C) (Oral)  Ht 5\' 9"  (1.753 m)  Wt 221 lb 6 oz (100.415 kg)  BMI 32.68 kg/m2  SpO2 96% Physical Exam  Constitutional: She is oriented to person, place, and time. She appears well-developed and well-nourished. No distress.  HENT:  Head: Normocephalic and atraumatic.    Right Ear: External ear normal.  Left Ear: External ear normal.  Nose: Nose normal.  Mouth/Throat: Oropharynx is clear and moist.  Eyes: Conjunctivae and EOM are normal. Pupils are equal, round, and reactive to light. Right eye exhibits no discharge. Left eye exhibits no discharge. No scleral icterus.  Neck: Normal range of motion. Neck supple. No tracheal deviation present. No thyromegaly present.  Pulmonary/Chest: Effort normal. No accessory muscle usage. No tachypnea. She has no decreased breath sounds. She has no rhonchi.  Musculoskeletal: Normal range of motion. She exhibits no edema or tenderness.  Lymphadenopathy:    She has no cervical adenopathy.  Neurological: She is alert and oriented to person, place, and time. No cranial nerve deficit. She exhibits normal muscle tone. Coordination normal.  Skin: Skin is warm and dry. No rash noted. She is not diaphoretic. No erythema. No pallor.    Psychiatric: She has a normal mood and affect. Her behavior is normal. Judgment and thought content normal.          Assessment & Plan:   Problem List Items Addressed This Visit      Unprioritized   Cellulitis and abscess of face - Primary    History of MRSA. Left facial ulceration with yellow crusting and surrounding induration c/w cellulitis. Will start topical gentamicin and oral Clindamycin. She will take a probiotic while on Clindamycin. She will call immediately if any worsening redness, pain, fever, chills. Follow up recheck tomorrow.      Relevant Medications   clindamycin (CLEOCIN) capsule       Return in about 1 day (around 03/14/2014).

## 2014-03-13 NOTE — Patient Instructions (Signed)
Call immediately if fever, worsening redness, pain, or other concerns.

## 2014-03-13 NOTE — Progress Notes (Signed)
Pre visit review using our clinic review tool, if applicable. No additional management support is needed unless otherwise documented below in the visit note. 

## 2014-03-13 NOTE — Assessment & Plan Note (Signed)
History of MRSA. Left facial ulceration with yellow crusting and surrounding induration c/w cellulitis. Will start topical gentamicin and oral Clindamycin. She will take a probiotic while on Clindamycin. She will call immediately if any worsening redness, pain, fever, chills. Follow up recheck tomorrow.

## 2014-03-14 ENCOUNTER — Ambulatory Visit (INDEPENDENT_AMBULATORY_CARE_PROVIDER_SITE_OTHER): Payer: No Typology Code available for payment source | Admitting: Internal Medicine

## 2014-03-14 ENCOUNTER — Encounter: Payer: Self-pay | Admitting: Internal Medicine

## 2014-03-14 VITALS — BP 122/72 | HR 70 | Temp 98.6°F | Ht 69.0 in | Wt 221.4 lb

## 2014-03-14 DIAGNOSIS — L03211 Cellulitis of face: Secondary | ICD-10-CM

## 2014-03-14 DIAGNOSIS — L0201 Cutaneous abscess of face: Secondary | ICD-10-CM

## 2014-03-14 NOTE — Assessment & Plan Note (Signed)
Cellulitis improving on Clindamycin. Will recheck on Friday and sooner as needed.

## 2014-03-14 NOTE — Progress Notes (Signed)
Pre visit review using our clinic review tool, if applicable. No additional management support is needed unless otherwise documented below in the visit note. 

## 2014-03-14 NOTE — Progress Notes (Signed)
   Subjective:    Patient ID: Christie Bryant, female    DOB: [birth year], 59 y.o.   MRN: [id number]  HPI  59YO female presents for follow up.  Last seen yesterday with cellulitis face. Started on Clindamycin and topical gentamicin. No fever overnight. Pain has improved. No visual changes. Some drainage from ulcerated area lateral to left eyelid Some swelling noted under left eye   Past medical, surgical, family and social history per today's encounter.  Review of Systems  Constitutional: Negative for fever, chills, appetite change, fatigue and unexpected weight change.  Eyes: Negative for pain and visual disturbance.  Respiratory: Negative for shortness of breath.   Cardiovascular: Negative for chest pain and leg swelling.  Gastrointestinal: Negative for abdominal pain.  Skin: Positive for color change and wound. Negative for rash.  Hematological: Negative for adenopathy. Does not bruise/bleed easily.  Psychiatric/Behavioral: Negative for dysphoric mood. The patient is not nervous/anxious.        Objective:    BP 122/72 mmHg  Pulse 70  Temp(Src) 98.6 F (37 C) (Oral)  Ht 5\' 9"  (1.753 m)  Wt 221 lb 6 oz (100.415 kg)  BMI 32.68 kg/m2  SpO2 97% Physical Exam  Constitutional: She is oriented to person, place, and time. She appears well-developed and well-nourished. No distress.  HENT:  Head: Normocephalic and atraumatic.    Right Ear: External ear normal.  Left Ear: External ear normal.  Nose: Nose normal.  Mouth/Throat: Oropharynx is clear and moist.  Eyes: Conjunctivae are normal. Pupils are equal, round, and reactive to light. Right eye exhibits no discharge. Left eye exhibits no discharge. No scleral icterus.  Neck: Normal range of motion. Neck supple. No tracheal deviation present. No thyromegaly present.  Pulmonary/Chest: Effort normal. No accessory muscle usage. No tachypnea. She has no decreased breath sounds. She has no rhonchi.  Musculoskeletal: Normal range of  motion. She exhibits no edema or tenderness.  Lymphadenopathy:    She has no cervical adenopathy.  Neurological: She is alert and oriented to person, place, and time. No cranial nerve deficit. She exhibits normal muscle tone. Coordination normal.  Skin: Skin is warm and dry. No rash noted. She is not diaphoretic. No erythema. No pallor.  Psychiatric: She has a normal mood and affect. Her behavior is normal. Judgment and thought content normal.          Assessment & Plan:   Problem List Items Addressed This Visit      Unprioritized   Cellulitis and abscess of face - Primary    Cellulitis improving on Clindamycin. Will recheck on Friday and sooner as needed.          Return in about 2 days (around 03/16/2014) for Recheck.

## 2014-03-14 NOTE — Patient Instructions (Signed)
Follow up quick check on Friday.

## 2014-03-16 ENCOUNTER — Ambulatory Visit (INDEPENDENT_AMBULATORY_CARE_PROVIDER_SITE_OTHER): Payer: No Typology Code available for payment source | Admitting: Internal Medicine

## 2014-03-16 ENCOUNTER — Encounter: Payer: Self-pay | Admitting: Internal Medicine

## 2014-03-16 VITALS — BP 132/80 | HR 67 | Temp 98.0°F | Ht 69.0 in | Wt 220.2 lb

## 2014-03-16 DIAGNOSIS — L0201 Cutaneous abscess of face: Secondary | ICD-10-CM

## 2014-03-16 DIAGNOSIS — A4902 Methicillin resistant Staphylococcus aureus infection, unspecified site: Secondary | ICD-10-CM

## 2014-03-16 DIAGNOSIS — L03211 Cellulitis of face: Secondary | ICD-10-CM

## 2014-03-16 NOTE — Assessment & Plan Note (Signed)
Symptoms improving with Clindamycin and topical Gentamicin. Will continue. She will email with update early next week. Follow up prn.

## 2014-03-16 NOTE — Progress Notes (Signed)
   Subjective:    Patient ID: Christie Bryant, female    DOB: 10/26/1955, 59 y.o.   MRN: [id number]  HPI  59YO female presents for follow up.  Currently being treated for left facial cellulitis with Clindamycin and topical Gentamicin. Redness and pain have improved. No fever, chills. No pain with eye movement.   Past medical, surgical, family and social history per today's encounter.  Review of Systems  Constitutional: Negative for fever and fatigue.  Respiratory: Negative for shortness of breath.   Cardiovascular: Negative for chest pain.  Gastrointestinal: Negative for nausea, vomiting, diarrhea and constipation.  Skin: Positive for color change and wound.       Objective:    BP 132/80 mmHg  Pulse 67  Temp(Src) 98 F (36.7 C) (Oral)  Ht 5\' 9"  (1.753 m)  Wt 220 lb 4 oz (99.905 kg)  BMI 32.51 kg/m2  SpO2 98% Physical Exam  Constitutional: She is oriented to person, place, and time. She appears well-developed and well-nourished. No distress.  HENT:  Head: Normocephalic.    Pulmonary/Chest: Effort normal.  Musculoskeletal: Normal range of motion.  Neurological: She is alert and oriented to person, place, and time. She has normal reflexes.  Skin: Skin is warm and dry. She is not diaphoretic. There is erythema.  Psychiatric: She has a normal mood and affect. Her behavior is normal. Judgment and thought content normal.          Assessment & Plan:   Problem List Items Addressed This Visit      Unprioritized   Cellulitis and abscess of face - Primary    Symptoms improving with Clindamycin and topical Gentamicin. Will continue. She will email with update early next week. Follow up prn.      MRSA infection   Relevant Orders   Ambulatory referral to Infectious Disease       Return in about 3 months (around 06/16/2014), or if symptoms worsen or fail to improve, for Recheck of Diabetes.

## 2014-03-16 NOTE — Progress Notes (Signed)
Pre visit review using our clinic review tool, if applicable. No additional management support is needed unless otherwise documented below in the visit note. 

## 2014-03-16 NOTE — Patient Instructions (Signed)
Email early next week with update.  [email or web address]

## 2014-03-19 ENCOUNTER — Other Ambulatory Visit: Payer: Self-pay | Admitting: Internal Medicine

## 2014-03-27 ENCOUNTER — Encounter: Payer: Self-pay | Admitting: Internal Medicine

## 2014-05-05 NOTE — Op Note (Signed)
PATIENT NAME:  Christie Bryant, Christie Bryant MR#:  [id number] DATE OF BIRTH:  02-Mar-1955  DATE OF PROCEDURE:  03/17/2013  PREOPERATIVE DIAGNOSIS:  Large gluteal abscess on the right.   POSTOPERATIVE DIAGNOSIS:  Large gluteal abscess on the right.  OPERATION PERFORMED:  Drainage of gluteal abscess on the right.    SURGEON:  Kathreen CosierS. G. Sankar, M.D.   ANESTHESIA:  General.   COMPLICATIONS:  None.   ESTIMATED BLOOD LOSS:  Minimal.   DRAINS:  None.   PROCEDURE:  The patient was put to sleep with an LMA and then placed in the left lateral position and held in place with a bean bag with attention paid to all pressure points.  Timeout was performed.  The abscess was located in the lower outer aspect of the right gluteal region and it was approximately 7 cm in size and appeared to be somewhat deep-seated in the subcutaneous tissue, did not seem to involve the muscular layers.  At the dependent part inferiorly, incision was planned.  The area was prepped and draped out as a sterile field.  Timeout was performed.  The skin incision was made in the marked area and deepened through into a multiloculated abscess cavity.  Using a Kelly clamp on these loculations are broken down and a moderate amount of thick grayish white pus was drained.  No odor was identified.  After the abscess was adequately drained, the cavity was irrigated out with about 50 to 60 mL of saline and a dry sterile dressing was placed.  Culture was obtained on the pus and sent for evaluation.  After dry dressing the patient was reversed and extubated and then moved to the recovery room in stable condition.     ____________________________ S.Wynona LunaG. Sankar, MD sgs:ea D: 03/17/2013 16:16:14 ET T: 03/17/2013 23:07:58 ET JOB#: [id number]  cc: S.G. Evette CristalSankar, MD, <Dictator> [facility] LPEE[facility]APUTH Wynona LunaG SANKAR MD ELECTRONICALLY SIGNED 03/18/2013 9:33

## 2014-05-07 ENCOUNTER — Other Ambulatory Visit: Payer: Self-pay | Admitting: Internal Medicine

## 2014-05-07 NOTE — Telephone Encounter (Signed)
May refill 1 month only. Needs follow up 

## 2014-05-07 NOTE — Telephone Encounter (Signed)
Last TSH 3.15.15.  Advise refill

## 2014-05-14 ENCOUNTER — Other Ambulatory Visit: Payer: Self-pay | Admitting: Internal Medicine

## 2014-05-14 DIAGNOSIS — Z1239 Encounter for other screening for malignant neoplasm of breast: Secondary | ICD-10-CM

## 2014-05-14 DIAGNOSIS — Z1211 Encounter for screening for malignant neoplasm of colon: Secondary | ICD-10-CM

## 2014-05-16 ENCOUNTER — Other Ambulatory Visit: Payer: No Typology Code available for payment source

## 2014-05-23 ENCOUNTER — Encounter: Payer: Self-pay | Admitting: *Deleted

## 2014-05-23 LAB — HM MAMMOGRAPHY

## 2014-05-24 ENCOUNTER — Ambulatory Visit: Payer: No Typology Code available for payment source | Admitting: Internal Medicine

## 2014-05-25 ENCOUNTER — Encounter: Payer: Self-pay | Admitting: Internal Medicine

## 2014-05-30 ENCOUNTER — Encounter: Payer: Self-pay | Admitting: Internal Medicine

## 2014-06-05 ENCOUNTER — Encounter: Payer: Self-pay | Admitting: Internal Medicine

## 2014-06-05 MED ORDER — DOXYCYCLINE HYCLATE 100 MG PO TABS: 100.0000 mg | ORAL_TABLET | Freq: Two times a day (BID) | ORAL | Status: DC

## 2014-06-05 NOTE — Telephone Encounter (Signed)
Rx sent to pharmacy by escript  

## 2014-06-05 NOTE — Telephone Encounter (Signed)
Per Dr. Dan HumphreysWalker :  Fine to fill Doxycycline 100mg  po bid x 10 days.

## 2014-07-05 ENCOUNTER — Encounter: Payer: Self-pay | Admitting: *Deleted

## 2014-07-06 ENCOUNTER — Ambulatory Visit (INDEPENDENT_AMBULATORY_CARE_PROVIDER_SITE_OTHER): Payer: No Typology Code available for payment source | Admitting: Family Medicine

## 2014-07-06 ENCOUNTER — Encounter: Payer: Self-pay | Admitting: Family Medicine

## 2014-07-06 VITALS — BP 102/70 | HR 63 | Temp 98.2°F | Wt 186.2 lb

## 2014-07-06 DIAGNOSIS — M545 Low back pain, unspecified: Secondary | ICD-10-CM

## 2014-07-06 MED ORDER — CYCLOBENZAPRINE HCL 10 MG PO TABS: 5.0000 mg | ORAL_TABLET | Freq: Three times a day (TID) | ORAL | Status: DC | PRN

## 2014-07-06 NOTE — Patient Instructions (Signed)
Try taking 1/2 flexeril.  Sedation caution.  Try icing.   This should gradually get better.  Take care. Glad to see you.

## 2014-07-06 NOTE — Progress Notes (Signed)
Pre visit review using our clinic review tool, if applicable. No additional management support is needed unless otherwise documented below in the visit note.  Back pain started at work about 2 weeks ago.  Burning in midback, then lower back also.  Mainly on the L side, occ on the R side.  She was picking up a dog 2 nights ago and had to pull him back.  She has to lift a lot at home- her husband has one leg.  No FCNAVD.   No B/B sx.    Her sister died a few weeks ago.  I offered my condolences.    Meds, vitals, and allergies reviewed.   ROS: See HPI.  Otherwise, noncontributory.  nad ncat Neck supple, no LA rrr Ctab abd soft L lower back ttp but not ttp in the midline.  L troch bursa ttp but no pain on int rotation of L hip SLR neg Distally with motor/sensation grossly intact in BLE

## 2014-07-07 DIAGNOSIS — M549 Dorsalgia, unspecified: Secondary | ICD-10-CM | POA: Insufficient documentation

## 2014-07-07 NOTE — Assessment & Plan Note (Signed)
Can take 1/2 to 1 flexeril. Sedation caution. Rec icing.  Likely benign strain with troch bursitis.  D/w pt.  She agrees.  This should gradually get better.

## 2014-07-09 ENCOUNTER — Telehealth: Payer: Self-pay | Admitting: Internal Medicine

## 2014-07-09 MED ORDER — GABAPENTIN 100 MG PO CAPS: 100.0000 mg | ORAL_CAPSULE | Freq: Every evening | ORAL | Status: DC | PRN

## 2014-07-09 MED ORDER — PREDNISONE 20 MG PO TABS: ORAL_TABLET | ORAL | Status: DC

## 2014-07-09 NOTE — Telephone Encounter (Signed)
Sounds like sciatica.  Would start prednisone with food.  It will inc her sugar transiently but is likely the best option.   Also can take gabapentin for pain.  Slowly increase the dose as tolerated.  Started with 100mg  qhs.  Update us if not better or if any true weakness.  rx sent. Thanks.  Routed to PCP as FYI.

## 2014-07-09 NOTE — Telephone Encounter (Signed)
Pt was seen on Friday by Dr Para Marchuncan.  Pt has experienced more pain over the weekend, her whole leg is numb, her hip pain is worse, and calf pain also.  Please call work number [phone]  Cel number for pt is [phone]. Pt requests cb

## 2014-07-09 NOTE — Telephone Encounter (Signed)
Patient notified as instructed by telephone and verbalized understanding. 

## 2014-07-13 ENCOUNTER — Other Ambulatory Visit: Payer: Self-pay | Admitting: Nurse Practitioner

## 2014-07-13 DIAGNOSIS — E785 Hyperlipidemia, unspecified: Secondary | ICD-10-CM

## 2014-07-13 DIAGNOSIS — E119 Type 2 diabetes mellitus without complications: Secondary | ICD-10-CM

## 2014-07-13 DIAGNOSIS — Z8379 Family history of other diseases of the digestive system: Secondary | ICD-10-CM

## 2014-07-17 ENCOUNTER — Telehealth: Payer: Self-pay

## 2014-07-17 NOTE — Telephone Encounter (Signed)
Pt had mammogram done on 5.11.16

## 2014-07-18 NOTE — Telephone Encounter (Signed)
LMTCB about scheduling a mammogram 

## 2014-07-19 ENCOUNTER — Ambulatory Visit
Admission: RE | Admit: 2014-07-19 | Discharge: 2014-07-19 | Disposition: A | Discharge status: Home / Self Care | Payer: Managed Care, Other (non HMO) | Source: Ambulatory Visit | Attending: Nurse Practitioner | Admitting: Nurse Practitioner

## 2014-07-19 DIAGNOSIS — E119 Type 2 diabetes mellitus without complications: Secondary | ICD-10-CM | POA: Diagnosis present

## 2014-07-19 DIAGNOSIS — E785 Hyperlipidemia, unspecified: Secondary | ICD-10-CM | POA: Diagnosis present

## 2014-07-19 DIAGNOSIS — Z8379 Family history of other diseases of the digestive system: Secondary | ICD-10-CM | POA: Diagnosis present

## 2014-07-19 DIAGNOSIS — K8020 Calculus of gallbladder without cholecystitis without obstruction: Secondary | ICD-10-CM | POA: Diagnosis not present

## 2014-08-12 DIAGNOSIS — Z87442 Personal history of urinary calculi: Secondary | ICD-10-CM | POA: Diagnosis not present

## 2014-08-12 DIAGNOSIS — Z79899 Other long term (current) drug therapy: Secondary | ICD-10-CM | POA: Diagnosis not present

## 2014-08-12 DIAGNOSIS — E119 Type 2 diabetes mellitus without complications: Secondary | ICD-10-CM | POA: Diagnosis not present

## 2014-08-12 DIAGNOSIS — Z7982 Long term (current) use of aspirin: Secondary | ICD-10-CM | POA: Diagnosis not present

## 2014-08-12 DIAGNOSIS — I252 Old myocardial infarction: Secondary | ICD-10-CM | POA: Diagnosis not present

## 2014-08-12 DIAGNOSIS — I10 Essential (primary) hypertension: Secondary | ICD-10-CM | POA: Diagnosis not present

## 2014-08-12 DIAGNOSIS — Z1211 Encounter for screening for malignant neoplasm of colon: Secondary | ICD-10-CM | POA: Diagnosis present

## 2014-08-12 DIAGNOSIS — E039 Hypothyroidism, unspecified: Secondary | ICD-10-CM | POA: Diagnosis not present

## 2014-08-12 DIAGNOSIS — E785 Hyperlipidemia, unspecified: Secondary | ICD-10-CM | POA: Diagnosis not present

## 2014-08-12 NOTE — Anesthesia Preprocedure Evaluation (Addendum)
Anesthesia Evaluation  Patient identified by MRN, date of birth, ID band Patient awake    Reviewed: Allergy & Precautions, H&P , NPO status , Patient's Chart, lab work & pertinent test results, reviewed documented beta blocker date and time   Airway Mallampati: II  TM Distance: >3 FB Neck ROM: limited    Dental no notable dental hx. (+) Teeth Intact   Pulmonary neg pulmonary ROS,  breath sounds clear to auscultation  Pulmonary exam normal       Cardiovascular Exercise Tolerance: Good hypertension, - Past MI Normal cardiovascular examRhythm:regular Rate:Normal     Neuro/Psych negative neurological ROS  negative psych ROS   GI/Hepatic negative GI ROS, Neg liver ROS,   Endo/Other  diabetes, Type 2Hypothyroidism   Renal/GU Renal disease  negative genitourinary   Musculoskeletal   Abdominal   Peds  Hematology negative hematology ROS (+)   Anesthesia Other Findings Past Medical History:   Chicken pox                                                  Hypertension                                                 Hyperlipidemia                                               Kidney stone                                                 Thyroid disease                                              Recurrent boils                                              Diabetes mellitus                               2012           Comment:Dr. Solum   Reproductive/Obstetrics negative OB ROS                            Anesthesia Physical Anesthesia Plan  ASA: III  Anesthesia Plan: General   Post-op Pain Management:    Induction:   Airway Management Planned:   Additional Equipment:   Intra-op Plan:   Post-operative Plan:   Informed Consent: I have reviewed the patients History and Physical, chart, labs and discussed the procedure including the risks, benefits and alternatives for the proposed anesthesia  with the patient or authorized  representative who has indicated his/her understanding and acceptance.   Dental Advisory Given  Plan Discussed with: Anesthesiologist, CRNA and Surgeon  Anesthesia Plan Comments:         Anesthesia Quick Evaluation

## 2014-08-13 ENCOUNTER — Ambulatory Visit
Admission: RE | Admit: 2014-08-13 | Discharge: 2014-08-13 | Disposition: A | Discharge status: Home / Self Care | Payer: Managed Care, Other (non HMO) | Source: Ambulatory Visit | Attending: Gastroenterology | Admitting: Gastroenterology

## 2014-08-13 ENCOUNTER — Encounter: Payer: Self-pay | Admitting: Anesthesiology

## 2014-08-13 ENCOUNTER — Encounter
Admission: RE | Disposition: A | Discharge status: Home / Self Care | Payer: Self-pay | Source: Ambulatory Visit | Attending: Gastroenterology

## 2014-08-13 ENCOUNTER — Ambulatory Visit: Payer: Managed Care, Other (non HMO) | Admitting: Anesthesiology

## 2014-08-13 DIAGNOSIS — I252 Old myocardial infarction: Secondary | ICD-10-CM | POA: Insufficient documentation

## 2014-08-13 DIAGNOSIS — Z7982 Long term (current) use of aspirin: Secondary | ICD-10-CM | POA: Insufficient documentation

## 2014-08-13 DIAGNOSIS — Z1211 Encounter for screening for malignant neoplasm of colon: Secondary | ICD-10-CM | POA: Insufficient documentation

## 2014-08-13 DIAGNOSIS — E119 Type 2 diabetes mellitus without complications: Secondary | ICD-10-CM | POA: Insufficient documentation

## 2014-08-13 DIAGNOSIS — I10 Essential (primary) hypertension: Secondary | ICD-10-CM | POA: Insufficient documentation

## 2014-08-13 DIAGNOSIS — E785 Hyperlipidemia, unspecified: Secondary | ICD-10-CM | POA: Insufficient documentation

## 2014-08-13 DIAGNOSIS — Z79899 Other long term (current) drug therapy: Secondary | ICD-10-CM | POA: Insufficient documentation

## 2014-08-13 DIAGNOSIS — E039 Hypothyroidism, unspecified: Secondary | ICD-10-CM | POA: Insufficient documentation

## 2014-08-13 DIAGNOSIS — Z87442 Personal history of urinary calculi: Secondary | ICD-10-CM | POA: Insufficient documentation

## 2014-08-13 HISTORY — PX: COLONOSCOPY WITH PROPOFOL: SHX5780

## 2014-08-13 LAB — GLUCOSE, CAPILLARY: Glucose-Capillary: 57 mg/dL — ABNORMAL LOW (ref 65–99)

## 2014-08-13 SURGERY — COLONOSCOPY WITH PROPOFOL
Anesthesia: General

## 2014-08-13 MED ORDER — PROPOFOL INFUSION 10 MG/ML OPTIME
INTRAVENOUS | Status: DC | PRN
  Administered 2014-08-13: 120 ug/kg/min via INTRAVENOUS

## 2014-08-13 MED ORDER — SODIUM CHLORIDE 0.9 % IV SOLN: INTRAVENOUS | Status: DC

## 2014-08-13 MED ORDER — PROPOFOL 10 MG/ML IV BOLUS
INTRAVENOUS | Status: DC | PRN
  Administered 2014-08-13: 80 mg via INTRAVENOUS

## 2014-08-13 MED ORDER — SODIUM CHLORIDE 0.9 % IV SOLN
INTRAVENOUS | Status: DC
  Administered 2014-08-13: 1000 mL via INTRAVENOUS
  Administered 2014-08-13: 08:00:00 via INTRAVENOUS

## 2014-08-13 MED ORDER — LIDOCAINE HCL (CARDIAC) 20 MG/ML IV SOLN
INTRAVENOUS | Status: DC | PRN
  Administered 2014-08-13: 20 mg via INTRAVENOUS

## 2014-08-13 NOTE — Anesthesia Postprocedure Evaluation (Signed)
  Anesthesia Post-op Note  Patient: Christie Bryant  Procedure(s) Performed: Procedure(s): COLONOSCOPY WITH PROPOFOL (N/A)  Anesthesia type:General  Patient location: PACU  Post pain: Pain level controlled  Post assessment: Post-op Vital signs reviewed, Patient's Cardiovascular Status Stable, Respiratory Function Stable, Patent Airway and No signs of Nausea or vomiting  Post vital signs: Reviewed and stable  Last Vitals:  Filed Vitals:   08/13/14 0830  BP: 110/77  Pulse: 58  Temp:   Resp: 21    Level of consciousness: awake, alert  and patient cooperative  Complications: No apparent anesthesia complications

## 2014-08-13 NOTE — Transfer of Care (Signed)
Immediate Anesthesia Transfer of Care Note  Patient: Christie Bryant  Procedure(s) Performed: Procedure(s): COLONOSCOPY WITH PROPOFOL (N/A)  Patient Location: PACU  Anesthesia Type:General  Level of Consciousness: awake  Airway & Oxygen Therapy: Patient Spontanous Breathing  Post-op Assessment: Report given to RN  Post vital signs: Reviewed  Last Vitals:  Filed Vitals:   08/13/14 0700  BP: 100/60  Pulse: 62  Temp: 36.3 C  Resp: 16    Complications: No apparent anesthesia complications

## 2014-08-13 NOTE — H&P (Signed)
Primary Care Physician:  Wynona Dove, MD Primary Gastroenterologist:  Dr. Bluford Kaufmann  Pre-Procedure History & Physical: HPI:  Christie Bryant is a 59 y.o. female is here for an colonoscopy.   Past Medical History  Diagnosis Date  . Chicken pox   . Hypertension   . Hyperlipidemia   . Kidney stone   . Thyroid disease   . Recurrent boils   . Diabetes mellitus 2012    Dr. Tedd Sias    Past Surgical History  Procedure Laterality Date  . Biopsy thyroid      normal, Dr. Tedd Sias  . Tubal ligation    . Breast reduction surgery    . Abdominal hysterectomy  1998    complete  . Gluteal abscess drainage  03/17/13    Prior to Admission medications   Medication Sig Start Date End Date Taking? Authorizing Provider  levothyroxine (SYNTHROID, LEVOTHROID) 112 MCG tablet TAKE ONE TABLET BY MOUTH ONCE DAILY 01/08/14  Yes Shelia Media, MD  metoprolol (LOPRESSOR) 100 MG tablet TAKE ONE TABLET BY MOUTH TWICE DAILY 03/19/14  Yes Shelia Media, MD  pravastatin (PRAVACHOL) 40 MG tablet Take 1 tablet (40 mg total) by mouth daily. 11/20/13  Yes Shelia Media, MD  triamterene-hydrochlorothiazide (MAXZIDE) 75-50 MG per tablet TAKE ONE-HALF TABLET BY MOUTH ONCE DAILY 03/05/14  Yes Shelia Media, MD  aspirin 81 MG tablet Take 81 mg by mouth daily.    Historical Provider, MD  canagliflozin (INVOKANA) 100 MG TABS tablet Take 100 mg by mouth.    Historical Provider, MD  Cholecalciferol (VITAMIN D-3) 1000 UNITS CAPS Take 2 capsules by mouth daily.    Historical Provider, MD  cyclobenzaprine (FLEXERIL) 10 MG tablet Take 0.5-1 tablets (5-10 mg total) by mouth 3 (three) times daily as needed for muscle spasms. Patient not taking: Reported on 08/13/2014 07/06/14   Joaquim Nam, MD  docusate sodium (COLACE) 100 MG capsule Take 100 mg by mouth 2 (two) times daily.    Historical Provider, MD  gabapentin (NEURONTIN) 100 MG capsule Take 1-3 capsules (100-300 mg total) by mouth at bedtime as needed  (pain). Patient not taking: Reported on 08/13/2014 07/09/14   Joaquim Nam, MD  glimepiride (AMARYL) 2 MG tablet Take 2 mg by mouth 2 (two) times daily. 09/13/13   Historical Provider, MD  metFORMIN (GLUCOPHAGE-XR) 500 MG 24 hr tablet TAKE TWO TABLETS BY MOUTH TWICE DAILY 01/22/14   Shelia Media, MD  predniSONE (DELTASONE) 20 MG tablet 40mg  a day for 5 days, then 20mg  a day for 5 days.  With food. 07/09/14   Joaquim Nam, MD  saxagliptin HCl (ONGLYZA) 2.5 MG TABS tablet Take 2.5 mg by mouth daily.    Historical Provider, MD    Allergies as of 07/17/2014  . (No Known Allergies)    Family History  Problem Relation Age of Onset  . Cancer Mother     ovarian/uterus  . Hyperlipidemia Father   . Heart disease Father   . Hypertension Father   . Cancer Father     throat  . Stroke Maternal Grandmother   . Hypertension Maternal Grandmother   . Stroke Maternal Grandfather   . Hypertension Maternal Grandfather   . Hypertension Other   . Heart disease Other   . Sudden death Other   . Diabetes Other   . Hypertension Sister   . Hypertension Brother   . Hypertension Sister   . Hypertension Sister     History  Social History  . Marital Status: Married    Spouse Name: N/A  . Number of Children: N/A  . Years of Education: N/A   Occupational History  . Not on file.   Social History Main Topics  . Smoking status: Never Smoker   . Smokeless tobacco: Not on file  . Alcohol Use: No  . Drug Use: No  . Sexual Activity: Not on file   Other Topics Concern  . Not on file   Social History Narrative   Lives in [place], from [place].       Work - Hospice    Review of Systems: See HPI, otherwise negative ROS  Physical Exam: BP 100/60 mmHg  Pulse 62  Temp(Src) 97.4 F (36.3 C) (Tympanic)  Resp 16  Ht 5' 10.5" (1.791 m)  Wt 78.926 kg (174 lb)  BMI 24.61 kg/m2  SpO2 99% General:   Alert,  pleasant and cooperative in NAD Head:  Normocephalic and atraumatic. Neck:   Supple; no masses or thyromegaly. Lungs:  Clear throughout to auscultation.    Heart:  Regular rate and rhythm. Abdomen:  Soft, nontender and nondistended. Normal bowel sounds, without guarding, and without rebound.   Neurologic:  Alert and  oriented x4;  grossly normal neurologically.  Impression/Plan: Christie Bryant is here for an colonoscopy to be performed for screening.  Risks, benefits, limitations, and alternatives regarding colonoscopy have been reviewed with the patient.  Questions have been answered.  All parties agreeable.   Dessirae Scarola, Ezzard Standing, MD  08/13/2014, 7:50 AM

## 2014-08-13 NOTE — Op Note (Signed)
[facility] Gastroenterology Patient Name: Christie Bryant Procedure Date: 08/13/2014 7:41 AM MRN: [id number] Account #: [id number] Date of Birth: 1955-08-20 Admit Type: Outpatient Age: 59 Room: [facility] ENDO ROOM 4 Gender: Female Note Status: Finalized Procedure:         Colonoscopy Indications:       Screening for colorectal malignant neoplasm Providers:         Ezzard Standing. Bluford Kaufmann, MD Referring MD:      Ginette Pitman. Dan Humphreys, MD (Referring MD) Medicines:         Monitored Anesthesia Care Complications:     No immediate complications. Procedure:         Pre-Anesthesia Assessment:                    - Prior to the procedure, a History and Physical was                     performed, and patient medications, allergies and                     sensitivities were reviewed. The patient's tolerance of                     previous anesthesia was reviewed.                    - The risks and benefits of the procedure and the sedation                     options and risks were discussed with the patient. All                     questions were answered and informed consent was obtained.                    - After reviewing the risks and benefits, the patient was                     deemed in satisfactory condition to undergo the procedure.                    After obtaining informed consent, the colonoscope was                     passed under direct vision. Throughout the procedure, the                     patient's blood pressure, pulse, and oxygen saturations                     were monitored continuously. The Colonoscope was                     introduced through the anus with the intention of                     advancing to the cecum. The scope was advanced to the                     sigmoid colon before the procedure was aborted.                     Medications were given. The colonoscopy was performed with  difficulty due to poor endoscopic visualization. The              patient tolerated the procedure well. The quality of the                     bowel preparation was unsatisfactory. Findings:      A moderate amount of stool was found in the rectum, precluding       visualization.      A large amount of solid stool was found in the sigmoid colon, precluding       visualization. Elected to stop due to poor visualization. Impression:        - Preparation of the colon was unsatisfactory.                    - Stool in the rectum.                    - Stool in the sigmoid colon.                    - No specimens collected. Recommendation:    - Discharge patient to home.                    - Repeat colonoscopy at the next available appointment                     because the examination was incomplete.                    - The findings and recommendations were discussed with the                     patient. Procedure Code(s): --- Professional ---                    [phone], Sigmoidoscopy, flexible; diagnostic, including                     collection of specimen(s) by brushing or washing, when                     performed (separate procedure) Diagnosis Code(s): --- Professional ---                    Z12.11, Encounter for screening for malignant neoplasm of                     colon CPT copyright 2014 American Medical Association. All rights reserved. The codes documented in this report are preliminary and upon coder review may  be revised to meet current compliance requirements. Wallace Cullens, MD 08/13/2014 8:06:25 AM This report has been signed electronically. Number of Addenda: 0 Note Initiated On: 08/13/2014 7:41 AM Total Procedure Duration: 0 hours 2 minutes 51 seconds       [facility]

## 2014-08-16 ENCOUNTER — Encounter: Payer: Self-pay | Admitting: Gastroenterology

## 2014-09-13 ENCOUNTER — Other Ambulatory Visit: Payer: Self-pay | Admitting: Internal Medicine

## 2014-11-12 ENCOUNTER — Encounter
Admission: RE | Disposition: A | Discharge status: Home / Self Care | Payer: Self-pay | Source: Ambulatory Visit | Attending: Gastroenterology

## 2014-11-12 ENCOUNTER — Ambulatory Visit: Payer: Managed Care, Other (non HMO) | Admitting: Anesthesiology

## 2014-11-12 ENCOUNTER — Ambulatory Visit
Admission: RE | Admit: 2014-11-12 | Discharge: 2014-11-12 | Disposition: A | Discharge status: Home / Self Care | Payer: Managed Care, Other (non HMO) | Source: Ambulatory Visit | Attending: Gastroenterology | Admitting: Gastroenterology

## 2014-11-12 ENCOUNTER — Other Ambulatory Visit: Payer: Self-pay

## 2014-11-12 ENCOUNTER — Encounter: Payer: Self-pay | Admitting: Anesthesiology

## 2014-11-12 DIAGNOSIS — Z8249 Family history of ischemic heart disease and other diseases of the circulatory system: Secondary | ICD-10-CM | POA: Diagnosis not present

## 2014-11-12 DIAGNOSIS — E119 Type 2 diabetes mellitus without complications: Secondary | ICD-10-CM | POA: Insufficient documentation

## 2014-11-12 DIAGNOSIS — E785 Hyperlipidemia, unspecified: Secondary | ICD-10-CM | POA: Insufficient documentation

## 2014-11-12 DIAGNOSIS — Z800 Family history of malignant neoplasm of digestive organs: Secondary | ICD-10-CM | POA: Diagnosis not present

## 2014-11-12 DIAGNOSIS — Z7984 Long term (current) use of oral hypoglycemic drugs: Secondary | ICD-10-CM | POA: Diagnosis not present

## 2014-11-12 DIAGNOSIS — Z90710 Acquired absence of both cervix and uterus: Secondary | ICD-10-CM | POA: Diagnosis not present

## 2014-11-12 DIAGNOSIS — Z9889 Other specified postprocedural states: Secondary | ICD-10-CM | POA: Diagnosis not present

## 2014-11-12 DIAGNOSIS — Z8041 Family history of malignant neoplasm of ovary: Secondary | ICD-10-CM | POA: Insufficient documentation

## 2014-11-12 DIAGNOSIS — Z79899 Other long term (current) drug therapy: Secondary | ICD-10-CM | POA: Insufficient documentation

## 2014-11-12 DIAGNOSIS — Z823 Family history of stroke: Secondary | ICD-10-CM | POA: Insufficient documentation

## 2014-11-12 DIAGNOSIS — Z7982 Long term (current) use of aspirin: Secondary | ICD-10-CM | POA: Insufficient documentation

## 2014-11-12 DIAGNOSIS — Z87442 Personal history of urinary calculi: Secondary | ICD-10-CM | POA: Insufficient documentation

## 2014-11-12 DIAGNOSIS — Z833 Family history of diabetes mellitus: Secondary | ICD-10-CM | POA: Insufficient documentation

## 2014-11-12 DIAGNOSIS — I10 Essential (primary) hypertension: Secondary | ICD-10-CM | POA: Insufficient documentation

## 2014-11-12 DIAGNOSIS — E079 Disorder of thyroid, unspecified: Secondary | ICD-10-CM | POA: Insufficient documentation

## 2014-11-12 DIAGNOSIS — Z1211 Encounter for screening for malignant neoplasm of colon: Secondary | ICD-10-CM | POA: Insufficient documentation

## 2014-11-12 HISTORY — PX: COLONOSCOPY WITH PROPOFOL: SHX5780

## 2014-11-12 LAB — GLUCOSE, CAPILLARY: Glucose-Capillary: 76 mg/dL (ref 65–99)

## 2014-11-12 LAB — HM COLONOSCOPY: HM Colonoscopy: NORMAL

## 2014-11-12 SURGERY — COLONOSCOPY WITH PROPOFOL
Anesthesia: General

## 2014-11-12 MED ORDER — LIDOCAINE HCL (CARDIAC) 20 MG/ML IV SOLN
INTRAVENOUS | Status: DC | PRN
  Administered 2014-11-12: 30 mg via INTRAVENOUS

## 2014-11-12 MED ORDER — SODIUM CHLORIDE 0.9 % IV SOLN
INTRAVENOUS | Status: DC
  Administered 2014-11-12: 1000 mL via INTRAVENOUS

## 2014-11-12 MED ORDER — PROPOFOL 10 MG/ML IV BOLUS
INTRAVENOUS | Status: DC | PRN
  Administered 2014-11-12: 30 mg via INTRAVENOUS
  Administered 2014-11-12: 80 mg via INTRAVENOUS

## 2014-11-12 MED ORDER — METFORMIN HCL ER 500 MG PO TB24: 1000.0000 mg | ORAL_TABLET | Freq: Two times a day (BID) | ORAL | Status: DC

## 2014-11-12 MED ORDER — SODIUM CHLORIDE 0.9 % IV SOLN: INTRAVENOUS | Status: DC

## 2014-11-12 MED ORDER — PROPOFOL 500 MG/50ML IV EMUL
INTRAVENOUS | Status: DC | PRN
  Administered 2014-11-12: 125 ug/kg/min via INTRAVENOUS

## 2014-11-12 NOTE — Anesthesia Preprocedure Evaluation (Signed)
Anesthesia Evaluation  Patient identified by MRN, date of birth, ID band Patient awake    Reviewed: Allergy & Precautions, H&P , NPO status , Patient's Chart, lab work & pertinent test results, reviewed documented beta blocker date and time   History of Anesthesia Complications Negative for: history of anesthetic complications  Airway Mallampati: II  TM Distance: >3 FB Neck ROM: full    Dental no notable dental hx. (+) Missing   Pulmonary neg pulmonary ROS,    Pulmonary exam normal breath sounds clear to auscultation       Cardiovascular Exercise Tolerance: Good hypertension, On Medications and On Home Beta Blockers (-) angina(-) CAD, (-) Past MI, (-) Cardiac Stents and (-) CABG Normal cardiovascular exam(-) dysrhythmias (-) Valvular Problems/Murmurs Rhythm:regular Rate:Normal     Neuro/Psych negative neurological ROS  negative psych ROS   GI/Hepatic negative GI ROS, Neg liver ROS,   Endo/Other  diabetes, Well Controlled, Oral Hypoglycemic AgentsHypothyroidism   Renal/GU Renal disease (kidney stones)  negative genitourinary   Musculoskeletal   Abdominal   Peds  Hematology negative hematology ROS (+)   Anesthesia Other Findings Past Medical History:   Chicken pox                                                  Hypertension                                                 Hyperlipidemia                                               Kidney stone                                                 Thyroid disease                                              Recurrent boils                                              Diabetes mellitus                               2012           Comment:Dr. Solum   Reproductive/Obstetrics negative OB ROS                             Anesthesia Physical Anesthesia Plan  ASA: III  Anesthesia Plan: General   Post-op Pain Management:    Induction:    Airway Management Planned:   Additional Equipment:   Intra-op  Plan:   Post-operative Plan:   Informed Consent: I have reviewed the patients History and Physical, chart, labs and discussed the procedure including the risks, benefits and alternatives for the proposed anesthesia with the patient or authorized representative who has indicated his/her understanding and acceptance.   Dental Advisory Given  Plan Discussed with: Anesthesiologist, CRNA and Surgeon  Anesthesia Plan Comments:         Anesthesia Quick Evaluation

## 2014-11-12 NOTE — H&P (Signed)
Primary Care Physician:  Wynona Dove, MD Primary Gastroenterologist:  Dr. Bluford Kaufmann  Pre-Procedure History & Physical: HPI:  Christie Bryant is a 59 y.o. female is here for an colonoscopy.  Past Medical History  Diagnosis Date  . Chicken pox   . Hypertension   . Hyperlipidemia   . Kidney stone   . Thyroid disease   . Recurrent boils   . Diabetes mellitus 2012    Dr. Tedd Sias    Past Surgical History  Procedure Laterality Date  . Biopsy thyroid      normal, Dr. Tedd Sias  . Tubal ligation    . Breast reduction surgery    . Abdominal hysterectomy  1998    complete  . Gluteal abscess drainage  03/17/13  . Colonoscopy with propofol N/A 08/13/2014    Procedure: COLONOSCOPY WITH PROPOFOL;  Surgeon: Wallace Cullens, MD;  Location: [facility] ENDOSCOPY;  Service: Gastroenterology;  Laterality: N/A;    Prior to Admission medications   Medication Sig Start Date End Date Taking? Authorizing Provider  aspirin 81 MG tablet Take 81 mg by mouth daily.   Yes Historical Provider, MD  canagliflozin (INVOKANA) 100 MG TABS tablet Take 100 mg by mouth.   Yes Historical Provider, MD  Cholecalciferol (VITAMIN D-3) 1000 UNITS CAPS Take 2 capsules by mouth daily.   Yes Historical Provider, MD  cyclobenzaprine (FLEXERIL) 10 MG tablet Take 0.5-1 tablets (5-10 mg total) by mouth 3 (three) times daily as needed for muscle spasms. 07/06/14  Yes Joaquim Nam, MD  docusate sodium (COLACE) 100 MG capsule Take 100 mg by mouth 2 (two) times daily.   Yes Historical Provider, MD  gabapentin (NEURONTIN) 100 MG capsule Take 1-3 capsules (100-300 mg total) by mouth at bedtime as needed (pain). 07/09/14  Yes Joaquim Nam, MD  glimepiride (AMARYL) 2 MG tablet Take 2 mg by mouth 2 (two) times daily. 09/13/13  Yes Historical Provider, MD  levothyroxine (SYNTHROID, LEVOTHROID) 112 MCG tablet TAKE ONE TABLET BY MOUTH ONCE DAILY 01/08/14  Yes Shelia Media, MD  metFORMIN (GLUCOPHAGE-XR) 500 MG 24 hr tablet TAKE TWO TABLETS BY MOUTH  TWICE DAILY 01/22/14  Yes Shelia Media, MD  metoprolol (LOPRESSOR) 100 MG tablet TAKE ONE TABLET BY MOUTH TWICE DAILY 09/14/14  Yes Shelia Media, MD  pravastatin (PRAVACHOL) 40 MG tablet Take 1 tablet (40 mg total) by mouth daily. 11/20/13  Yes Shelia Media, MD  predniSONE (DELTASONE) 20 MG tablet  a day for 5 days, then  a day for 5 days.  With food. 07/09/14  Yes Joaquim Nam, MD  saxagliptin HCl (ONGLYZA) 2.5 MG TABS tablet Take 2.5 mg by mouth daily.   Yes Historical Provider, MD  triamterene-hydrochlorothiazide (MAXZIDE) 75-50 MG per tablet TAKE ONE-HALF TABLET BY MOUTH ONCE DAILY 03/05/14  Yes Shelia Media, MD    Allergies as of 08/29/2014  . (No Known Allergies)    Family History  Problem Relation Age of Onset  . Cancer Mother     ovarian/uterus  . Hyperlipidemia Father   . Heart disease Father   . Hypertension Father   . Cancer Father     throat  . Stroke Maternal Grandmother   . Hypertension Maternal Grandmother   . Stroke Maternal Grandfather   . Hypertension Maternal Grandfather   . Hypertension Other   . Heart disease Other   . Sudden death Other   . Diabetes Other   . Hypertension Sister   . Hypertension Brother   .  Hypertension Sister   . Hypertension Sister     Social History   Social History  . Marital Status: Married    Spouse Name: N/A  . Number of Children: N/A  . Years of Education: N/A   Occupational History  . Not on file.   Social History Main Topics  . Smoking status: Never Smoker   . Smokeless tobacco: Not on file  . Alcohol Use: No  . Drug Use: No  . Sexual Activity: Not on file   Other Topics Concern  . Not on file   Social History Narrative   [place]       Work - Hospice    Review of Systems: See HPI, otherwise negative ROS  Physical Exam: BP 107/63 mmHg  Pulse 58  Temp(Src) 97.4 F (36.3 C) (Tympanic)  Resp 16  Ht 5\' 11"  (1.803 m)  Wt 75.297 kg (166 lb)  BMI  23.16 kg/m2  SpO2 100% General:   Alert,  pleasant and cooperative in NAD Head:  Normocephalic and atraumatic. Neck:  Supple; no masses or thyromegaly. Lungs:  Clear throughout to auscultation.    Heart:  Regular rate and rhythm. Abdomen:  Soft, nontender and nondistended. Normal bowel sounds, without guarding, and without rebound.   Neurologic:  Alert and  oriented x4;  grossly normal neurologically.  Impression/Plan: Christie Bryant is here for an colonoscopy to be performed for screening.  Risks, benefits, limitations, and alternatives regarding colonoscopy have been reviewed with the patient.  Questions have been answered.  All parties agreeable.   Ellison Leisure, Ezzard StandingPAUL Y, MD  11/12/2014, 8:00 AM

## 2014-11-12 NOTE — Anesthesia Postprocedure Evaluation (Signed)
  Anesthesia Post-op Note  Patient: Christie Bryant  Procedure(s) Performed: Procedure(s): COLONOSCOPY WITH PROPOFOL (N/A)  Anesthesia type:General  Patient location: PACU  Post pain: Pain level controlled  Post assessment: Post-op Vital signs reviewed, Patient's Cardiovascular Status Stable, Respiratory Function Stable, Patent Airway and No signs of Nausea or vomiting  Post vital signs: Reviewed and stable  Last Vitals:  Filed Vitals:   11/12/14 0910  BP: 110/64  Pulse: 53  Temp:   Resp: 15    Level of consciousness: awake, alert  and patient cooperative  Complications: No apparent anesthesia complications

## 2014-11-12 NOTE — Op Note (Signed)
[facility] Gastroenterology Patient Name: Christie Bryant Procedure Date: 11/12/2014 8:02 AM MRN: [id number] Account #: [id number] Date of Birth: 1955/12/17 Admit Type: Outpatient Age: 59 Room: [facility] ENDO ROOM 4 Gender: Female Note Status: Finalized Procedure:         Colonoscopy Indications:       Screening for colorectal malignant neoplasm Providers:         Ezzard StandingPaul Y. Bluford Kaufmannh, MD Referring MD:      Ginette PitmanJennifer A. Dan HumphreysWalker, MD (Referring MD) Medicines:         Monitored Anesthesia Care Complications:     No immediate complications. Procedure:         Pre-Anesthesia Assessment:                    - Prior to the procedure, a History and Physical was                     performed, and patient medications, allergies and                     sensitivities were reviewed. The patient's tolerance of                     previous anesthesia was reviewed.                    - The risks and benefits of the procedure and the sedation                     options and risks were discussed with the patient. All                     questions were answered and informed consent was obtained.                    - After reviewing the risks and benefits, the patient was                     deemed in satisfactory condition to undergo the procedure.                    After obtaining informed consent, the colonoscope was                     passed under direct vision. Throughout the procedure, the                     patient's blood pressure, pulse, and oxygen saturations                     were monitored continuously. The Colonoscope was                     introduced through the anus and advanced to the the cecum,                     identified by appendiceal orifice and ileocecal valve. The                     colonoscopy was performed without difficulty. The patient                     tolerated the procedure well. The quality of the bowel  preparation was poor. Findings:  The colon (entire examined portion) appeared normal. Spent long time       lavaging and suctioning liquid stool throughout. Impression:        - Preparation of the colon was poor.                    - The entire examined colon is normal.                    - No specimens collected. Recommendation:    - Discharge patient to home.                    - Repeat colonoscopy in 10 years for surveillance.                    - The findings and recommendations were discussed with the                     patient. Procedure Code(s): --- Professional ---                    [phone], Colonoscopy, flexible; diagnostic, including                     collection of specimen(s) by brushing or washing, when                     performed (separate procedure) Diagnosis Code(s): --- Professional ---                    Z12.11, Encounter for screening for malignant neoplasm of                     colon CPT copyright 2014 American Medical Association. All rights reserved. The codes documented in this report are preliminary and upon coder review may  be revised to meet current compliance requirements. Wallace Cullens, MD 11/12/2014 8:35:36 AM This report has been signed electronically. Number of Addenda: 0 Note Initiated On: 11/12/2014 8:02 AM Scope Withdrawal Time: 0 hours 16 minutes 30 seconds  Total Procedure Duration: 0 hours 25 minutes 28 seconds       [facility]

## 2014-11-12 NOTE — Transfer of Care (Signed)
Immediate Anesthesia Transfer of Care Note  Patient: Christie Bryant  Procedure(s) Performed: Procedure(s): COLONOSCOPY WITH PROPOFOL (N/A)  Patient Location: Endoscopy Unit  Anesthesia Type:General  Level of Consciousness: awake, alert  and oriented  Airway & Oxygen Therapy: Patient Spontanous Breathing and Patient connected to nasal cannula oxygen  Post-op Assessment: Report given to RN and Post -op Vital signs reviewed and stable  Post vital signs: Reviewed and stable  Last Vitals:  Filed Vitals:   11/12/14 0713  BP: 107/63  Pulse: 58  Temp: 36.3 C  Resp: 16    Complications: No apparent anesthesia complications

## 2014-11-13 ENCOUNTER — Encounter: Payer: Self-pay | Admitting: Gastroenterology

## 2014-11-18 ENCOUNTER — Other Ambulatory Visit: Payer: Self-pay | Admitting: Internal Medicine

## 2015-02-12 ENCOUNTER — Encounter: Payer: Self-pay | Admitting: *Deleted

## 2015-02-18 ENCOUNTER — Other Ambulatory Visit: Payer: Self-pay | Admitting: Internal Medicine

## 2015-05-20 ENCOUNTER — Other Ambulatory Visit: Payer: Self-pay | Admitting: Internal Medicine

## 2018-04-26 DIAGNOSIS — E1142 Type 2 diabetes mellitus with diabetic polyneuropathy: Secondary | ICD-10-CM | POA: Diagnosis not present

## 2018-04-26 DIAGNOSIS — E039 Hypothyroidism, unspecified: Secondary | ICD-10-CM | POA: Diagnosis not present

## 2018-07-27 DIAGNOSIS — E039 Hypothyroidism, unspecified: Secondary | ICD-10-CM | POA: Diagnosis not present

## 2018-07-27 DIAGNOSIS — E1142 Type 2 diabetes mellitus with diabetic polyneuropathy: Secondary | ICD-10-CM | POA: Diagnosis not present

## 2018-08-03 DIAGNOSIS — N183 Chronic kidney disease, stage 3 unspecified: Secondary | ICD-10-CM | POA: Insufficient documentation

## 2018-08-03 DIAGNOSIS — E1142 Type 2 diabetes mellitus with diabetic polyneuropathy: Secondary | ICD-10-CM | POA: Insufficient documentation

## 2018-08-03 DIAGNOSIS — E1122 Type 2 diabetes mellitus with diabetic chronic kidney disease: Secondary | ICD-10-CM | POA: Insufficient documentation

## 2018-09-26 ENCOUNTER — Ambulatory Visit: Payer: Managed Care, Other (non HMO) | Admitting: Family Medicine

## 2018-10-07 ENCOUNTER — Other Ambulatory Visit: Payer: Self-pay

## 2018-10-11 ENCOUNTER — Ambulatory Visit: Payer: Self-pay | Admitting: Family Medicine

## 2018-10-19 DIAGNOSIS — Z23 Encounter for immunization: Secondary | ICD-10-CM | POA: Diagnosis not present

## 2018-11-22 ENCOUNTER — Ambulatory Visit: Payer: Self-pay | Admitting: Family Medicine

## 2018-12-28 ENCOUNTER — Ambulatory Visit: Payer: Self-pay | Admitting: Family

## 2019-01-25 ENCOUNTER — Ambulatory Visit: Payer: Self-pay | Admitting: Family

## 2019-02-21 ENCOUNTER — Ambulatory Visit: Payer: Self-pay | Admitting: Family

## 2019-03-03 ENCOUNTER — Ambulatory Visit: Payer: 59 | Admitting: Family

## 2019-03-03 ENCOUNTER — Telehealth: Payer: Self-pay

## 2019-03-03 ENCOUNTER — Encounter: Payer: Self-pay | Admitting: Family

## 2019-03-03 DIAGNOSIS — Z0289 Encounter for other administrative examinations: Secondary | ICD-10-CM

## 2019-03-03 NOTE — Telephone Encounter (Signed)
I tried to call patient to get her to sign on for doxy appointment at 10:30 [facility] for her to reschedule.

## 2019-03-03 NOTE — Progress Notes (Deleted)
Virtual Visit via Video Note  I connected with@  on 03/03/19 at 10:30 AM EST by a video enabled telemedicine application and verified that I am speaking with the correct person using two identifiers.  Location patient: home Location provider:work  Persons participating in the virtual visit: patient, provider  I discussed the limitations of evaluation and management by telemedicine and the availability of in person appointments. The patient expressed understanding and agreed to proceed.   HPI: DM- follows with Dr Gabriel Carina Prior pcp Dr Netty Starring  ROS: See pertinent positives and negatives per HPI.  Past Medical History:  Diagnosis Date  . Chicken pox   . Diabetes mellitus 2012   Dr. Gabriel Carina  . Hyperlipidemia   . Hypertension   . Kidney stone   . Recurrent boils   . Thyroid disease     Past Surgical History:  Procedure Laterality Date  . ABDOMINAL HYSTERECTOMY  1998   complete  . BIOPSY THYROID     normal, Dr. Gabriel Carina  . BREAST REDUCTION SURGERY    . COLONOSCOPY WITH PROPOFOL N/A 08/13/2014   Procedure: COLONOSCOPY WITH PROPOFOL;  Surgeon: Hulen Luster, MD;  Location: [facility] ENDOSCOPY;  Service: Gastroenterology;  Laterality: N/A;  . COLONOSCOPY WITH PROPOFOL N/A 11/12/2014   Procedure: COLONOSCOPY WITH PROPOFOL;  Surgeon: Hulen Luster, MD;  Location: [facility] ENDOSCOPY;  Service: Gastroenterology;  Laterality: N/A;  . gluteal abscess drainage  03/17/13  . TUBAL LIGATION      Family History  Problem Relation Age of Onset  . Cancer Mother        ovarian/uterus  . Hyperlipidemia Father   . Heart disease Father   . Hypertension Father   . Cancer Father        throat  . Stroke Maternal Grandmother   . Hypertension Maternal Grandmother   . Stroke Maternal Grandfather   . Hypertension Maternal Grandfather   . Hypertension Other   . Heart disease Other   . Sudden death Other   . Diabetes Other   . Hypertension Sister   . Hypertension Brother   . Hypertension Sister   . Hypertension  Sister     SOCIAL HX: ***   Current Outpatient Medications:  .  aspirin 81 MG tablet, Take 81 mg by mouth daily., Disp: , Rfl:  .  canagliflozin (INVOKANA) 100 MG TABS tablet, Take 100 mg by mouth., Disp: , Rfl:  .  Cholecalciferol (VITAMIN D-3) 1000 UNITS CAPS, Take 2 capsules by mouth daily., Disp: , Rfl:  .  cyclobenzaprine (FLEXERIL) 10 MG tablet, Take 0.5-1 tablets (5-10 mg total) by mouth 3 (three) times daily as needed for muscle spasms., Disp: 30 tablet, Rfl: 0 .  docusate sodium (COLACE) 100 MG capsule, Take 100 mg by mouth 2 (two) times daily., Disp: , Rfl:  .  gabapentin (NEURONTIN) 100 MG capsule, Take 1-3 capsules (100-300 mg total) by mouth at bedtime as needed (pain)., Disp: 30 capsule, Rfl: 1 .  glimepiride (AMARYL) 2 MG tablet, Take 2 mg by mouth 2 (two) times daily., Disp: , Rfl:  .  levothyroxine (SYNTHROID, LEVOTHROID) 112 MCG tablet, TAKE ONE TABLET BY MOUTH ONCE DAILY, Disp: 30 tablet, Rfl: 0 .  metFORMIN (GLUCOPHAGE-XR) 500 MG 24 hr tablet, Take 2 tablets (1,000 mg total) by mouth 2 (two) times daily., Disp: 120 tablet, Rfl: 5 .  metoprolol (LOPRESSOR) 100 MG tablet, TAKE ONE TABLET BY MOUTH TWICE DAILY, Disp: 60 tablet, Rfl: 0 .  pravastatin (PRAVACHOL) 40 MG tablet, TAKE ONE TABLET  BY MOUTH ONCE DAILY, Disp: 90 tablet, Rfl: 0 .  predniSONE (DELTASONE) 20 MG tablet, 40mg  a day for 5 days, then 20mg  a day for 5 days.  With food., Disp: 15 tablet, Rfl: 0 .  saxagliptin HCl (ONGLYZA) 2.5 MG TABS tablet, Take 2.5 mg by mouth daily., Disp: , Rfl:  .  triamterene-hydrochlorothiazide (MAXZIDE) 75-50 MG per tablet, TAKE ONE-HALF TABLET BY MOUTH ONCE DAILY, Disp: 30 tablet, Rfl: 3  EXAM:  VITALS per patient if applicable:  GENERAL: alert, oriented, appears well and in no acute distress  HEENT: atraumatic, conjunttiva clear, no obvious abnormalities on inspection of external nose and ears  NECK: normal movements of the head and neck  LUNGS: on inspection no signs of  respiratory distress, breathing rate appears normal, no obvious gross SOB, gasping or wheezing  CV: no obvious cyanosis  MS: moves all visible extremities without noticeable abnormality  PSYCH/NEURO: pleasant and cooperative, no obvious depression or anxiety, speech and thought processing grossly intact  ASSESSMENT AND PLAN:  Discussed the following assessment and plan:  No diagnosis found.  -we discussed possible serious and likely etiologies, options for evaluation and workup, limitations of telemedicine visit vs in person visit, treatment, treatment risks and precautions. Pt prefers to treat via telemedicine empirically rather then risking or undertaking an in person visit at this moment. Patient agrees to seek prompt in person care if worsening, new symptoms arise, or if is not improving with treatment.   I discussed the assessment and treatment plan with the patient. The patient was provided an opportunity to ask questions and all were answered. The patient agreed with the plan and demonstrated an understanding of the instructions.   The patient was advised to call back or seek an in-person evaluation if the symptoms worsen or if the condition fails to improve as anticipated.   , FNP

## 2019-04-03 DIAGNOSIS — E1142 Type 2 diabetes mellitus with diabetic polyneuropathy: Secondary | ICD-10-CM | POA: Diagnosis not present

## 2019-04-03 DIAGNOSIS — E782 Mixed hyperlipidemia: Secondary | ICD-10-CM | POA: Diagnosis not present

## 2019-04-03 DIAGNOSIS — N183 Chronic kidney disease, stage 3 unspecified: Secondary | ICD-10-CM | POA: Diagnosis not present

## 2019-04-03 DIAGNOSIS — E1122 Type 2 diabetes mellitus with diabetic chronic kidney disease: Secondary | ICD-10-CM | POA: Diagnosis not present

## 2019-04-03 DIAGNOSIS — Z794 Long term (current) use of insulin: Secondary | ICD-10-CM | POA: Diagnosis not present

## 2019-04-10 DIAGNOSIS — Z794 Long term (current) use of insulin: Secondary | ICD-10-CM | POA: Diagnosis not present

## 2019-04-10 DIAGNOSIS — N183 Chronic kidney disease, stage 3 unspecified: Secondary | ICD-10-CM | POA: Diagnosis not present

## 2019-04-10 DIAGNOSIS — E1122 Type 2 diabetes mellitus with diabetic chronic kidney disease: Secondary | ICD-10-CM | POA: Diagnosis not present

## 2019-08-31 DIAGNOSIS — E119 Type 2 diabetes mellitus without complications: Secondary | ICD-10-CM | POA: Diagnosis not present

## 2019-09-01 DIAGNOSIS — E039 Hypothyroidism, unspecified: Secondary | ICD-10-CM | POA: Diagnosis not present

## 2019-09-01 DIAGNOSIS — E1122 Type 2 diabetes mellitus with diabetic chronic kidney disease: Secondary | ICD-10-CM | POA: Diagnosis not present

## 2019-09-01 DIAGNOSIS — Z794 Long term (current) use of insulin: Secondary | ICD-10-CM | POA: Diagnosis not present

## 2019-09-01 DIAGNOSIS — N183 Chronic kidney disease, stage 3 unspecified: Secondary | ICD-10-CM | POA: Diagnosis not present

## 2019-09-05 ENCOUNTER — Ambulatory Visit (INDEPENDENT_AMBULATORY_CARE_PROVIDER_SITE_OTHER): Payer: 59 | Admitting: Vascular Surgery

## 2019-09-05 ENCOUNTER — Encounter (INDEPENDENT_AMBULATORY_CARE_PROVIDER_SITE_OTHER): Payer: Self-pay | Admitting: Vascular Surgery

## 2019-09-05 ENCOUNTER — Other Ambulatory Visit: Payer: Self-pay

## 2019-09-05 VITALS — BP 139/77 | HR 65 | Resp 16 | Ht 70.0 in | Wt 235.4 lb

## 2019-09-05 DIAGNOSIS — L03116 Cellulitis of left lower limb: Secondary | ICD-10-CM

## 2019-09-05 DIAGNOSIS — I8310 Varicose veins of unspecified lower extremity with inflammation: Secondary | ICD-10-CM | POA: Insufficient documentation

## 2019-09-05 DIAGNOSIS — R600 Localized edema: Secondary | ICD-10-CM | POA: Diagnosis not present

## 2019-09-05 DIAGNOSIS — E1165 Type 2 diabetes mellitus with hyperglycemia: Secondary | ICD-10-CM | POA: Diagnosis not present

## 2019-09-05 DIAGNOSIS — E785 Hyperlipidemia, unspecified: Secondary | ICD-10-CM | POA: Diagnosis not present

## 2019-09-05 DIAGNOSIS — I10 Essential (primary) hypertension: Secondary | ICD-10-CM | POA: Diagnosis not present

## 2019-09-05 DIAGNOSIS — R609 Edema, unspecified: Secondary | ICD-10-CM | POA: Insufficient documentation

## 2019-09-05 MED ORDER — CEPHALEXIN 500 MG PO CAPS: 500.0000 mg | ORAL_CAPSULE | Freq: Three times a day (TID) | ORAL | 0 refills | Status: DC

## 2019-09-05 NOTE — Assessment & Plan Note (Signed)
The patient has developed cellulitis in addition to pain and swelling from her venous insufficiency.  Venous reflux study to be performed when her cellulitis is calm down.  This will assess for venous reflux that should be treated to avoid recurrence as well as assess for potential thrombotic issues.

## 2019-09-05 NOTE — Patient Instructions (Signed)

## 2019-09-05 NOTE — Assessment & Plan Note (Signed)
blood pressure control important in reducing the progression of atherosclerotic disease. On appropriate oral medications.  

## 2019-09-05 NOTE — Assessment & Plan Note (Signed)
The patient is developed cellulitis from a pronounced swelling and chronic venous insufficiency.  Were going to treat her with 3 layer Unna boot to be changed weekly for a few weeks and then get a venous reflux study.  I am also giving her a prescription for Keflex for a week.  She will likely need her underlying venous disease taken care of to avoid recurrence.

## 2019-09-05 NOTE — Progress Notes (Signed)
Patient ID: Christie Bryant, female   DOB: [birth year], 64 y.o.   MRN: [id number]  Chief Complaint  Patient presents with  . New Patient (Initial Visit)    ref Solum varicose veins,patient concern with left le swelling    HPI Christie Bryant is a 64 y.o. female.  I am asked to see the patient by Dr. Tedd Sias for evaluation of varicose veins and venous stasis.  The patient had been seen for varicose veins and venous insufficiency several years ago and it was recommended that she have laser ablation for incompetent left great saphenous vein, but her insurance at that time did not it.  She has had intermittent pain and swelling in that left leg over several years which is gradually progressed.  The right leg has some swelling as well although not nearly as bad.  A couple of weeks ago, the swelling became much more pronounced and there became redness and a marked increase in the pain in the left lower leg.  This is become very tender to the touch.  There are a couple of blisters on the medial ankle area and this goes down into the foot.  No fevers or chills.  No chest pain or shortness of breath.     Past Medical History:  Diagnosis Date  . Chicken pox   . Diabetes mellitus 2012   Dr. Tedd Sias  . Hyperlipidemia   . Hypertension   . Kidney stone   . Recurrent boils   . Thyroid disease     Past Surgical History:  Procedure Laterality Date  . ABDOMINAL HYSTERECTOMY  1998   complete  . BIOPSY THYROID     normal, Dr. Tedd Sias  . BREAST REDUCTION SURGERY    . COLONOSCOPY WITH PROPOFOL N/A 08/13/2014   Procedure: COLONOSCOPY WITH PROPOFOL;  Surgeon: Wallace Cullens, MD;  Location: [facility] ENDOSCOPY;  Service: Gastroenterology;  Laterality: N/A;  . COLONOSCOPY WITH PROPOFOL N/A 11/12/2014   Procedure: COLONOSCOPY WITH PROPOFOL;  Surgeon: Wallace Cullens, MD;  Location: [facility] ENDOSCOPY;  Service: Gastroenterology;  Laterality: N/A;  . gluteal abscess drainage  03/17/13  . TUBAL LIGATION       Family History  Problem  Relation Age of Onset  . Cancer Mother        ovarian/uterus  . Hyperlipidemia Father   . Heart disease Father   . Hypertension Father   . Cancer Father        throat  . Hypertension Sister   . Hypertension Brother   . Hypertension Sister   . Hypertension Sister   . Stroke Maternal Grandmother   . Hypertension Maternal Grandmother   . Stroke Maternal Grandfather   . Hypertension Maternal Grandfather   . Hypertension Other   . Heart disease Other   . Sudden death Other   . Diabetes Other       Social History   Tobacco Use  . Smoking status: Never Smoker  . Smokeless tobacco: Never Used  Substance Use Topics  . Alcohol use: No  . Drug use: No    No Known Allergies  Current Outpatient Medications  Medication Sig Dispense Refill  . aspirin 81 MG tablet Take 81 mg by mouth daily.    . Cholecalciferol (VITAMIN D-3) 1000 UNITS CAPS Take 2 capsules by mouth daily.    Marland Kitchen docusate sodium (COLACE) 100 MG capsule Take 100 mg by mouth 2 (two) times daily.    Marland Kitchen FARXIGA 10 MG TABS tablet Take 10 mg  by mouth every morning.    Marland Kitchen levothyroxine (SYNTHROID, LEVOTHROID) 112 MCG tablet TAKE ONE TABLET BY MOUTH ONCE DAILY 30 tablet 0  . metFORMIN (GLUCOPHAGE-XR) 500 MG 24 hr tablet Take 2 tablets (1,000 mg total) by mouth 2 (two) times daily. 120 tablet 5  . metoprolol (LOPRESSOR) 100 MG tablet TAKE ONE TABLET BY MOUTH TWICE DAILY 60 tablet 0  . Multiple Vitamin (MULTIVITAMIN) capsule Take 1 capsule by mouth daily.    . pravastatin (PRAVACHOL) 40 MG tablet TAKE ONE TABLET BY MOUTH ONCE DAILY 90 tablet 0  . triamterene-hydrochlorothiazide (MAXZIDE) 75-50 MG per tablet TAKE ONE-HALF TABLET BY MOUTH ONCE DAILY 30 tablet 3  . canagliflozin (INVOKANA) 100 MG TABS tablet Take 100 mg by mouth. (Patient not taking: Reported on 09/05/2019)    . cyclobenzaprine (FLEXERIL) 10 MG tablet Take 0.5-1 tablets (5-10 mg total) by mouth 3 (three) times daily as needed for muscle spasms. (Patient not taking:  Reported on 09/05/2019) 30 tablet 0  . gabapentin (NEURONTIN) 100 MG capsule Take 1-3 capsules (100-300 mg total) by mouth at bedtime as needed (pain). (Patient not taking: Reported on 09/05/2019) 30 capsule 1  . glimepiride (AMARYL) 2 MG tablet Take 2 mg by mouth 2 (two) times daily. (Patient not taking: Reported on 09/05/2019)    . predniSONE (DELTASONE) 20 MG tablet 40mg  a day for 5 days, then 20mg  a day for 5 days.  With food. (Patient not taking: Reported on 09/05/2019) 15 tablet 0  . saxagliptin HCl (ONGLYZA) 2.5 MG TABS tablet Take 2.5 mg by mouth daily. (Patient not taking: Reported on 09/05/2019)     No current facility-administered medications for this visit.      REVIEW OF SYSTEMS (Negative unless checked)  Constitutional: [] Weight loss  [] Fever  [] Chills Cardiac: [] Chest pain   [] Chest pressure   [] Palpitations   [] Shortness of breath when laying flat   [] Shortness of breath at rest   [] Shortness of breath with exertion. Vascular:  [x] Pain in legs with walking   [x] Pain in legs at rest   [x] Pain in legs when laying flat   [] Claudication   [] Pain in feet when walking  [] Pain in feet at rest  [] Pain in feet when laying flat   [] History of DVT   [] Phlebitis   [x] Swelling in legs   [x] Varicose veins   [] Non-healing ulcers Pulmonary:   [] Uses home oxygen   [] Productive cough   [] Hemoptysis   [] Wheeze  [] COPD   [] Asthma Neurologic:  [] Dizziness  [] Blackouts   [] Seizures   [] History of stroke   [] History of TIA  [] Aphasia   [] Temporary blindness   [] Dysphagia   [] Weakness or numbness in arms   [] Weakness or numbness in legs Musculoskeletal:  [x] Arthritis   [] Joint swelling   [] Joint pain   [] Low back pain Hematologic:  [] Easy bruising  [] Easy bleeding   [] Hypercoagulable state   [] Anemic  [] Hepatitis Gastrointestinal:  [] Blood in stool   [] Vomiting blood  [] Gastroesophageal reflux/heartburn   [] Abdominal pain Genitourinary:  [] Chronic kidney disease   [] Difficult urination  [] Frequent urination   [] Burning with urination   [] Hematuria Skin:  [] Rashes   [] Ulcers   [] Wounds Psychological:  [] History of anxiety   []  History of major depression.    Physical Exam BP 139/77 (BP Location: Right Arm)   Pulse 65   Resp 16   Ht 5\' 10"  (1.778 m)   Wt 235 lb 6.4 oz (106.8 kg)   BMI 33.78 kg/m  Gen:  WD/WN, NAD Head:  Parkman/AT, No temporalis wasting.  Ear/Nose/Throat: Hearing grossly intact, nares w/o erythema or drainage, oropharynx w/o Erythema/Exudate Eyes: Conjunctiva clear, sclera non-icteric  Neck: trachea midline.  No JVD.  Pulmonary:  Good air movement, respirations not labored, no use of accessory muscles  Cardiac: RRR, no JVD Vascular:  Vessel Right Left  Radial Palpable Palpable                          DP  2+  2+  PT  1+  not palpable   Gastrointestinal:. No masses, surgical incisions, or scars. Musculoskeletal: M/S 5/5 throughout.  Extremities without ischemic changes.  No deformity or atrophy.  Erythema is present on the lower third of the left calf and leg.  This extends down into the ankle and proximal foot.  There is some blistering present.  Capillary refill is brisk.  The area is tender to the touch.  Trace right lower extremity edema, 2-3+ left lower extremity edema. Neurologic: Sensation grossly intact in extremities.  Symmetrical.  Speech is fluent. Motor exam as listed above. Psychiatric: Judgment intact, Mood & affect appropriate for pt's clinical situation. Dermatologic: No rashes or ulcers noted.  No cellulitis or open wounds.    Radiology No results found.  Labs No results found for this or any previous visit (from the past 2160 hour(s)).  Assessment/Plan:  Hypertension blood pressure control important in reducing the progression of atherosclerotic disease. On appropriate oral medications.   Diabetes mellitus type 2, uncontrolled blood glucose control important in reducing the progression of atherosclerotic disease. Also, involved in wound  healing. On appropriate medications.   Hyperlipidemia with target low density lipoprotein (LDL) cholesterol less than 70 mg/dL lipid control important in reducing the progression of atherosclerotic disease. Continue statin therapy   Edema The patient has prominent lower extremity edema likely from venous insufficiency and has now developed cellulitis.  Cellulitis of leg, left The patient is developed cellulitis from a pronounced swelling and chronic venous insufficiency.  Were going to treat her with 3 layer Unna boot to be changed weekly for a few weeks and then get a venous reflux study.  I am also giving her a prescription for Keflex for a week.  She will likely need her underlying venous disease taken care of to avoid recurrence.  Varicose veins with inflammation The patient has developed cellulitis in addition to pain and swelling from her venous insufficiency.  Venous reflux study to be performed when her cellulitis is calm down.  This will assess for venous reflux that should be treated to avoid recurrence as well as assess for potential thrombotic issues.      Festus Barren 09/05/2019, 1:19 PM   This note was created with Dragon medical transcription system.  Any errors from dictation are unintentional.

## 2019-09-05 NOTE — Assessment & Plan Note (Signed)
blood glucose control important in reducing the progression of atherosclerotic disease. Also, involved in wound healing. On appropriate medications.  

## 2019-09-05 NOTE — Assessment & Plan Note (Signed)
The patient has prominent lower extremity edema likely from venous insufficiency and has now developed cellulitis.

## 2019-09-05 NOTE — Assessment & Plan Note (Signed)
lipid control important in reducing the progression of atherosclerotic disease. Continue statin therapy  

## 2019-09-07 ENCOUNTER — Encounter (INDEPENDENT_AMBULATORY_CARE_PROVIDER_SITE_OTHER): Payer: No Typology Code available for payment source | Admitting: Nurse Practitioner

## 2019-09-07 ENCOUNTER — Encounter (INDEPENDENT_AMBULATORY_CARE_PROVIDER_SITE_OTHER): Payer: No Typology Code available for payment source

## 2019-09-12 ENCOUNTER — Other Ambulatory Visit: Payer: Self-pay

## 2019-09-12 ENCOUNTER — Ambulatory Visit (INDEPENDENT_AMBULATORY_CARE_PROVIDER_SITE_OTHER): Payer: 59 | Admitting: Nurse Practitioner

## 2019-09-12 VITALS — BP 139/81 | HR 67 | Ht 70.0 in | Wt 235.0 lb

## 2019-09-12 DIAGNOSIS — L03116 Cellulitis of left lower limb: Secondary | ICD-10-CM | POA: Diagnosis not present

## 2019-09-12 NOTE — Progress Notes (Signed)
History of Present Illness  There is no documented history at this time  Assessments & Plan   There are no diagnoses linked to this encounter.    Additional instructions  Subjective:  Patient presents with venous ulcer of the Left lower extremity.    Procedure:  3 layer unna wrap was placed Left lower extremity.   Plan:   Follow up in one week.  

## 2019-09-13 ENCOUNTER — Encounter (INDEPENDENT_AMBULATORY_CARE_PROVIDER_SITE_OTHER): Payer: Self-pay | Admitting: Nurse Practitioner

## 2019-09-19 ENCOUNTER — Ambulatory Visit: Payer: 59 | Admitting: Cardiovascular Disease

## 2019-09-19 ENCOUNTER — Other Ambulatory Visit: Payer: Self-pay

## 2019-09-19 ENCOUNTER — Ambulatory Visit (INDEPENDENT_AMBULATORY_CARE_PROVIDER_SITE_OTHER): Payer: 59 | Admitting: Nurse Practitioner

## 2019-09-19 VITALS — BP 152/82 | HR 69 | Ht 70.0 in | Wt 237.0 lb

## 2019-09-19 DIAGNOSIS — L03116 Cellulitis of left lower limb: Secondary | ICD-10-CM

## 2019-09-19 MED ORDER — CEPHALEXIN 500 MG PO CAPS: 500.0000 mg | ORAL_CAPSULE | Freq: Three times a day (TID) | ORAL | 0 refills | Status: DC

## 2019-09-19 NOTE — Progress Notes (Signed)
History of Present Illness  There is no documented history at this time  Assessments & Plan   There are no diagnoses linked to this encounter.    Additional instructions  Subjective:  Patient presents with venous ulcer of the Left lower extremity.    Procedure:  3 layer unna wrap was placed Left lower extremity.   Plan:   Follow up in one week. Pt needs refill on Keflex per fallon for toe infection.  Fallon sent the Rx into the pts pharmacy on file.

## 2019-09-20 ENCOUNTER — Encounter (INDEPENDENT_AMBULATORY_CARE_PROVIDER_SITE_OTHER): Payer: Self-pay | Admitting: Nurse Practitioner

## 2019-09-22 DIAGNOSIS — Z1231 Encounter for screening mammogram for malignant neoplasm of breast: Secondary | ICD-10-CM | POA: Diagnosis not present

## 2019-09-27 ENCOUNTER — Encounter (INDEPENDENT_AMBULATORY_CARE_PROVIDER_SITE_OTHER): Payer: Self-pay | Admitting: Nurse Practitioner

## 2019-09-27 ENCOUNTER — Ambulatory Visit (INDEPENDENT_AMBULATORY_CARE_PROVIDER_SITE_OTHER): Payer: 59 | Admitting: Nurse Practitioner

## 2019-09-27 ENCOUNTER — Ambulatory Visit (INDEPENDENT_AMBULATORY_CARE_PROVIDER_SITE_OTHER): Payer: 59

## 2019-09-27 ENCOUNTER — Other Ambulatory Visit: Payer: Self-pay

## 2019-09-27 VITALS — BP 136/76 | HR 69 | Resp 16 | Wt 238.8 lb

## 2019-09-27 DIAGNOSIS — I8310 Varicose veins of unspecified lower extremity with inflammation: Secondary | ICD-10-CM | POA: Diagnosis not present

## 2019-09-27 DIAGNOSIS — I10 Essential (primary) hypertension: Secondary | ICD-10-CM | POA: Diagnosis not present

## 2019-09-27 DIAGNOSIS — R609 Edema, unspecified: Secondary | ICD-10-CM

## 2019-09-27 DIAGNOSIS — L03116 Cellulitis of left lower limb: Secondary | ICD-10-CM

## 2019-10-02 ENCOUNTER — Encounter (INDEPENDENT_AMBULATORY_CARE_PROVIDER_SITE_OTHER): Payer: Self-pay | Admitting: Nurse Practitioner

## 2019-10-05 NOTE — Progress Notes (Signed)
Subjective:    Patient ID: Christie Bryant, female    DOB: 02-Sep-1955, 65 y.o.   MRN: [id number] Chief Complaint  Patient presents with  . Follow-up    ultrasound and left unna boot follow up    The patient returns for evaluation of her lower extremity edema and varicose veins.  The patient continues to have pain in the lower extremities with dependency. The pain is lessened with elevation. Graduated compression stockings, Class I (20-30 mmHg), have been worn but the stockings do not eliminate the leg pain. Over-the-counter analgesics do not improve the symptoms. The degree of discomfort continues to interfere with daily activities. The patient notes the pain in the legs is causing problems with daily exercise, at the workplace and even with household activities and maintenance such as standing in the kitchen preparing meals and doing dishes.   Venous ultrasound shows normal deep venous system, no evidence of acute or chronic DVT.  Superficial reflux is present in the left great saphenous vein beginning at the saphenofemoral junction extending to the proximal calf.  Vein diameters range from 0.54cm to 1.10 cm.   Review of Systems     Objective:   Physical Exam  BP 136/76 (BP Location: Right Arm)   Pulse 69   Resp 16   Wt 238 lb 12.8 oz (108.3 kg)   BMI 34.26 kg/m   Past Medical History:  Diagnosis Date  . Chicken pox   . Diabetes mellitus 2012   Dr. Tedd Sias  . Hyperlipidemia   . Hypertension   . Kidney stone   . Recurrent boils   . Thyroid disease     Social History   Socioeconomic History  . Marital status: Married    Spouse name: Not on file  . Number of children: Not on file  . Years of education: Not on file  . Highest education level: Not on file  Occupational History  . Not on file  Tobacco Use  . Smoking status: Never Smoker  . Smokeless tobacco: Never Used  Substance and Sexual Activity  . Alcohol use: No  . Drug use: No  . Sexual activity: Not on file    Other Topics Concern  . Not on file  Social History Narrative   Lives in [place], from [place].       Work - Dispensing optician   Social Determinants of Corporate investment banker Strain:   . Difficulty of Paying Living Expenses: Not on file  Food Insecurity:   . Worried About Programme researcher, broadcasting/film/video in the Last Year: Not on file  . Ran Out of Food in the Last Year: Not on file  Transportation Needs:   . Lack of Transportation (Medical): Not on file  . Lack of Transportation (Non-Medical): Not on file  Physical Activity:   . Days of Exercise per Week: Not on file  . Minutes of Exercise per Session: Not on file  Stress:   . Feeling of Stress : Not on file  Social Connections:   . Frequency of Communication with Friends and Family: Not on file  . Frequency of Social Gatherings with Friends and Family: Not on file  . Attends Religious Services: Not on file  . Active Member of Clubs or Organizations: Not on file  . Attends Banker Meetings: Not on file  . Marital Status: Not on file  Intimate Partner Violence:   . Fear of Current or Ex-Partner: Not on file  . Emotionally Abused:  Not on file  . Physically Abused: Not on file  . Sexually Abused: Not on file    Past Surgical History:  Procedure Laterality Date  . ABDOMINAL HYSTERECTOMY  1998   complete  . BIOPSY THYROID     normal, Dr. Tedd Sias  . BREAST REDUCTION SURGERY    . COLONOSCOPY WITH PROPOFOL N/A 08/13/2014   Procedure: COLONOSCOPY WITH PROPOFOL;  Surgeon: Wallace Cullens, MD;  Location[facility] ENDOSCOPY;  Service: Gastroenterology;  Laterality: N/A;  . COLONOSCOPY WITH PROPOFOL N/A 11/12/2014   Procedure: COLONOSCOPY WITH PROPOFOL;  Surgeon: Wallace Cullens, MD;  Location: [facility] ENDOSCOPY;  Service: Gastroenterology;  Laterality: N/A;  . gluteal abscess drainage  03/17/13  . TUBAL LIGATION      Family History  Problem Relation Age of Onset  . Cancer Mother        ovarian/uterus  . Hyperlipidemia Father   . Heart  disease Father   . Hypertension Father   . Cancer Father        throat  . Hypertension Sister   . Hypertension Brother   . Hypertension Sister   . Hypertension Sister   . Stroke Maternal Grandmother   . Hypertension Maternal Grandmother   . Stroke Maternal Grandfather   . Hypertension Maternal Grandfather   . Hypertension Other   . Heart disease Other   . Sudden death Other   . Diabetes Other     No Known Allergies     Assessment & Plan:   1. Varicose veins with inflammation Recommend  I have reviewed my previous  discussion with the patient regarding  varicose veins and why they cause symptoms. Patient will continue  wearing graduated compression stockings class 1 on a daily basis, beginning first thing in the morning and removing them in the evening.    In addition, behavioral modification including elevation during the day was again discussed and this will continue.  The patient has utilized over the counter pain medications and has been exercising.  However, at this time conservative therapy has not alleviated the patient's symptoms of leg pain and swelling  Recommend: laser ablation of the left great saphenous veins to eliminate the symptoms of pain and swelling of the lower extremities caused by the severe superficial venous reflux disease.   The patient will also be retiring, this changing insurance soon.  We will wait and the patient change his insurance before applying for venous laser ablation 2. Cellulitis of leg, left Resolved at this time  3. Essential hypertension Continue antihypertensive medications as already ordered, these medications have been reviewed and there are no changes at this time.    Current Outpatient Medications on File Prior to Visit  Medication Sig Dispense Refill  . aspirin 81 MG tablet Take 81 mg by mouth daily.    . cephALEXin (KEFLEX) 500 MG capsule Take 1 capsule (500 mg total) by mouth 3 (three) times daily. 21 capsule 0  .  Cholecalciferol (VITAMIN D-3) 1000 UNITS CAPS Take 2 capsules by mouth daily.    Marland Kitchen docusate sodium (COLACE) 100 MG capsule Take 100 mg by mouth 2 (two) times daily.    Marland Kitchen FARXIGA 10 MG TABS tablet Take 10 mg by mouth every morning.    Marland Kitchen levothyroxine (SYNTHROID, LEVOTHROID) 112 MCG tablet TAKE ONE TABLET BY MOUTH ONCE DAILY 30 tablet 0  . metFORMIN (GLUCOPHAGE-XR) 500 MG 24 hr tablet Take 2 tablets (1,000 mg total) by mouth 2 (two) times daily. 120 tablet 5  . metoprolol (  LOPRESSOR) 100 MG tablet TAKE ONE TABLET BY MOUTH TWICE DAILY 60 tablet 0  . Multiple Vitamin (MULTIVITAMIN) capsule Take 1 capsule by mouth daily.    . pravastatin (PRAVACHOL) 40 MG tablet TAKE ONE TABLET BY MOUTH ONCE DAILY 90 tablet 0  . TRESIBA FLEXTOUCH 200 UNIT/ML FlexTouch Pen SMARTSIG:0-60 Unit(s) SUB-Q Daily    . triamterene-hydrochlorothiazide (MAXZIDE) 75-50 MG per tablet TAKE ONE-HALF TABLET BY MOUTH ONCE DAILY 30 tablet 3  . canagliflozin (INVOKANA) 100 MG TABS tablet Take 100 mg by mouth. (Patient not taking: Reported on 09/05/2019)    . cyclobenzaprine (FLEXERIL) 10 MG tablet Take 0.5-1 tablets (5-10 mg total) by mouth 3 (three) times daily as needed for muscle spasms. (Patient not taking: Reported on 09/05/2019) 30 tablet 0  . gabapentin (NEURONTIN) 100 MG capsule Take 1-3 capsules (100-300 mg total) by mouth at bedtime as needed (pain). (Patient not taking: Reported on 09/05/2019) 30 capsule 1  . glimepiride (AMARYL) 2 MG tablet Take 2 mg by mouth 2 (two) times daily. (Patient not taking: Reported on 09/05/2019)    . predniSONE (DELTASONE) 20 MG tablet 40mg  a day for 5 days, then 20mg  a day for 5 days.  With food. (Patient not taking: Reported on 09/05/2019) 15 tablet 0  . saxagliptin HCl (ONGLYZA) 2.5 MG TABS tablet Take 2.5 mg by mouth daily. (Patient not taking: Reported on 09/05/2019)     No current facility-administered medications on file prior to visit.    There are no Patient Instructions on file for this  visit. No follow-ups on file.   09/07/2019, NP

## 2019-10-06 ENCOUNTER — Other Ambulatory Visit: Payer: Self-pay

## 2019-10-06 ENCOUNTER — Inpatient Hospital Stay (HOSPITAL_COMMUNITY)
Admission: EM | Admit: 2019-10-06 | Discharge: 2019-10-10 | DRG: 872 | Disposition: A | Discharge status: Home / Self Care | Payer: 59 | Attending: Internal Medicine | Admitting: Internal Medicine

## 2019-10-06 ENCOUNTER — Emergency Department (HOSPITAL_COMMUNITY): Payer: 59

## 2019-10-06 DIAGNOSIS — E1165 Type 2 diabetes mellitus with hyperglycemia: Secondary | ICD-10-CM | POA: Diagnosis not present

## 2019-10-06 DIAGNOSIS — E079 Disorder of thyroid, unspecified: Secondary | ICD-10-CM | POA: Diagnosis present

## 2019-10-06 DIAGNOSIS — L03116 Cellulitis of left lower limb: Secondary | ICD-10-CM | POA: Diagnosis not present

## 2019-10-06 DIAGNOSIS — Z8249 Family history of ischemic heart disease and other diseases of the circulatory system: Secondary | ICD-10-CM

## 2019-10-06 DIAGNOSIS — Z833 Family history of diabetes mellitus: Secondary | ICD-10-CM | POA: Diagnosis not present

## 2019-10-06 DIAGNOSIS — L03032 Cellulitis of left toe: Secondary | ICD-10-CM | POA: Diagnosis not present

## 2019-10-06 DIAGNOSIS — E039 Hypothyroidism, unspecified: Secondary | ICD-10-CM | POA: Diagnosis not present

## 2019-10-06 DIAGNOSIS — A419 Sepsis, unspecified organism: Secondary | ICD-10-CM | POA: Diagnosis not present

## 2019-10-06 DIAGNOSIS — Z7984 Long term (current) use of oral hypoglycemic drugs: Secondary | ICD-10-CM

## 2019-10-06 DIAGNOSIS — E1142 Type 2 diabetes mellitus with diabetic polyneuropathy: Secondary | ICD-10-CM | POA: Diagnosis not present

## 2019-10-06 DIAGNOSIS — Z7982 Long term (current) use of aspirin: Secondary | ICD-10-CM

## 2019-10-06 DIAGNOSIS — J9811 Atelectasis: Secondary | ICD-10-CM | POA: Diagnosis not present

## 2019-10-06 DIAGNOSIS — Z9049 Acquired absence of other specified parts of digestive tract: Secondary | ICD-10-CM

## 2019-10-06 DIAGNOSIS — Z9851 Tubal ligation status: Secondary | ICD-10-CM | POA: Diagnosis not present

## 2019-10-06 DIAGNOSIS — Z20822 Contact with and (suspected) exposure to covid-19: Secondary | ICD-10-CM | POA: Diagnosis not present

## 2019-10-06 DIAGNOSIS — Z87442 Personal history of urinary calculi: Secondary | ICD-10-CM

## 2019-10-06 DIAGNOSIS — M7989 Other specified soft tissue disorders: Secondary | ICD-10-CM | POA: Diagnosis not present

## 2019-10-06 DIAGNOSIS — R000 Tachycardia, unspecified: Secondary | ICD-10-CM | POA: Diagnosis not present

## 2019-10-06 DIAGNOSIS — E785 Hyperlipidemia, unspecified: Secondary | ICD-10-CM | POA: Diagnosis not present

## 2019-10-06 DIAGNOSIS — Z79899 Other long term (current) drug therapy: Secondary | ICD-10-CM | POA: Diagnosis not present

## 2019-10-06 DIAGNOSIS — L0390 Cellulitis, unspecified: Secondary | ICD-10-CM | POA: Diagnosis not present

## 2019-10-06 DIAGNOSIS — M19072 Primary osteoarthritis, left ankle and foot: Secondary | ICD-10-CM | POA: Diagnosis not present

## 2019-10-06 DIAGNOSIS — I10 Essential (primary) hypertension: Secondary | ICD-10-CM | POA: Diagnosis not present

## 2019-10-06 HISTORY — DX: Chronic kidney disease, unspecified: N18.9

## 2019-10-06 NOTE — ED Triage Notes (Addendum)
Pt is a diabetic and has been having issues with a vein in her left leg. Pt has a bad wound on her left foot with purple coloring and drainage around the big toe. Pt is tacky and fevers as high as 102. In triage temp was 100.9. Pt is flushed and saying she feels dizzy and weak.

## 2019-10-07 ENCOUNTER — Other Ambulatory Visit: Payer: Self-pay

## 2019-10-07 ENCOUNTER — Inpatient Hospital Stay (HOSPITAL_COMMUNITY): Payer: 59

## 2019-10-07 ENCOUNTER — Emergency Department (HOSPITAL_COMMUNITY): Payer: 59

## 2019-10-07 DIAGNOSIS — A419 Sepsis, unspecified organism: Secondary | ICD-10-CM | POA: Diagnosis present

## 2019-10-07 DIAGNOSIS — Z20822 Contact with and (suspected) exposure to covid-19: Secondary | ICD-10-CM | POA: Diagnosis present

## 2019-10-07 DIAGNOSIS — Z79899 Other long term (current) drug therapy: Secondary | ICD-10-CM | POA: Diagnosis not present

## 2019-10-07 DIAGNOSIS — Z87442 Personal history of urinary calculi: Secondary | ICD-10-CM | POA: Diagnosis not present

## 2019-10-07 DIAGNOSIS — Z7984 Long term (current) use of oral hypoglycemic drugs: Secondary | ICD-10-CM | POA: Diagnosis not present

## 2019-10-07 DIAGNOSIS — E039 Hypothyroidism, unspecified: Secondary | ICD-10-CM | POA: Diagnosis present

## 2019-10-07 DIAGNOSIS — Z833 Family history of diabetes mellitus: Secondary | ICD-10-CM | POA: Diagnosis not present

## 2019-10-07 DIAGNOSIS — E079 Disorder of thyroid, unspecified: Secondary | ICD-10-CM | POA: Diagnosis present

## 2019-10-07 DIAGNOSIS — L03032 Cellulitis of left toe: Secondary | ICD-10-CM | POA: Diagnosis present

## 2019-10-07 DIAGNOSIS — L03116 Cellulitis of left lower limb: Secondary | ICD-10-CM | POA: Diagnosis present

## 2019-10-07 DIAGNOSIS — E1142 Type 2 diabetes mellitus with diabetic polyneuropathy: Secondary | ICD-10-CM | POA: Diagnosis present

## 2019-10-07 DIAGNOSIS — Z9851 Tubal ligation status: Secondary | ICD-10-CM | POA: Diagnosis not present

## 2019-10-07 DIAGNOSIS — L0390 Cellulitis, unspecified: Secondary | ICD-10-CM | POA: Diagnosis present

## 2019-10-07 DIAGNOSIS — I10 Essential (primary) hypertension: Secondary | ICD-10-CM | POA: Diagnosis present

## 2019-10-07 DIAGNOSIS — E785 Hyperlipidemia, unspecified: Secondary | ICD-10-CM | POA: Diagnosis present

## 2019-10-07 DIAGNOSIS — E1165 Type 2 diabetes mellitus with hyperglycemia: Secondary | ICD-10-CM | POA: Diagnosis present

## 2019-10-07 DIAGNOSIS — Z7982 Long term (current) use of aspirin: Secondary | ICD-10-CM | POA: Diagnosis not present

## 2019-10-07 DIAGNOSIS — Z9049 Acquired absence of other specified parts of digestive tract: Secondary | ICD-10-CM | POA: Diagnosis not present

## 2019-10-07 DIAGNOSIS — Z8249 Family history of ischemic heart disease and other diseases of the circulatory system: Secondary | ICD-10-CM | POA: Diagnosis not present

## 2019-10-07 LAB — CBC WITH DIFFERENTIAL/PLATELET
Abs Immature Granulocytes: 0.05 10*3/uL (ref 0.00–0.07)
Basophils Absolute: 0 10*3/uL (ref 0.0–0.1)
Basophils Relative: 0 %
Eosinophils Absolute: 0.2 10*3/uL (ref 0.0–0.5)
Eosinophils Relative: 1 %
HCT: 41 % (ref 36.0–46.0)
Hemoglobin: 13 g/dL (ref 12.0–15.0)
Immature Granulocytes: 0 %
Lymphocytes Relative: 8 %
Lymphs Abs: 1 10*3/uL (ref 0.7–4.0)
MCH: 29 pg (ref 26.0–34.0)
MCHC: 31.7 g/dL (ref 30.0–36.0)
MCV: 91.3 fL (ref 80.0–100.0)
Monocytes Absolute: 0.9 10*3/uL (ref 0.1–1.0)
Monocytes Relative: 8 %
Neutro Abs: 10.1 10*3/uL — ABNORMAL HIGH (ref 1.7–7.7)
Neutrophils Relative %: 83 %
Platelets: 190 10*3/uL (ref 150–400)
RBC: 4.49 MIL/uL (ref 3.87–5.11)
RDW: 15.6 % — ABNORMAL HIGH (ref 11.5–15.5)
WBC: 12.3 10*3/uL — ABNORMAL HIGH (ref 4.0–10.5)
nRBC: 0 % (ref 0.0–0.2)

## 2019-10-07 LAB — PROTIME-INR
INR: 1 (ref 0.8–1.2)
Prothrombin Time: 13.2 seconds (ref 11.4–15.2)

## 2019-10-07 LAB — URINALYSIS, ROUTINE W REFLEX MICROSCOPIC
Bacteria, UA: NONE SEEN
Bilirubin Urine: NEGATIVE
Glucose, UA: >=500 mg/dL — AB
Hgb urine dipstick: NEGATIVE
Ketones, ur: NEGATIVE mg/dL
Leukocytes,Ua: NEGATIVE
Nitrite: NEGATIVE
Protein, ur: NEGATIVE mg/dL
Specific Gravity, Urine: 1.026 (ref 1.005–1.030)
pH: 5 (ref 5.0–8.0)

## 2019-10-07 LAB — COMPREHENSIVE METABOLIC PANEL
ALT: 24 U/L (ref 0–44)
AST: 27 U/L (ref 15–41)
Albumin: 3.8 g/dL (ref 3.5–5.0)
Alkaline Phosphatase: 62 U/L (ref 38–126)
Anion gap: 14 (ref 5–15)
BUN: 18 mg/dL (ref 8–23)
CO2: 19 mmol/L — ABNORMAL LOW (ref 22–32)
Calcium: 9.6 mg/dL (ref 8.9–10.3)
Chloride: 101 mmol/L (ref 98–111)
Creatinine, Ser: 1.14 mg/dL — ABNORMAL HIGH (ref 0.44–1.00)
GFR calc Af Amer: 58 mL/min — ABNORMAL LOW (ref >60)
GFR calc non Af Amer: 50 mL/min — ABNORMAL LOW (ref >60)
Glucose, Bld: 198 mg/dL — ABNORMAL HIGH (ref 70–99)
Potassium: 3.7 mmol/L (ref 3.5–5.1)
Sodium: 134 mmol/L — ABNORMAL LOW (ref 135–145)
Total Bilirubin: 1.5 mg/dL — ABNORMAL HIGH (ref 0.3–1.2)
Total Protein: 7.7 g/dL (ref 6.5–8.1)

## 2019-10-07 LAB — GLUCOSE, CAPILLARY
Glucose-Capillary: 193 mg/dL — ABNORMAL HIGH (ref 70–99)
Glucose-Capillary: 146 mg/dL — ABNORMAL HIGH (ref 70–99)
Glucose-Capillary: 130 mg/dL — ABNORMAL HIGH (ref 70–99)

## 2019-10-07 LAB — RESPIRATORY PANEL BY RT PCR (FLU A&B, COVID)
Influenza A by PCR: NEGATIVE
Influenza B by PCR: NEGATIVE
SARS Coronavirus 2 by RT PCR: NEGATIVE

## 2019-10-07 LAB — HEMOGLOBIN A1C
Hgb A1c MFr Bld: 7.7 % — ABNORMAL HIGH (ref 4.8–5.6)
Mean Plasma Glucose: 174.29 mg/dL

## 2019-10-07 LAB — LACTIC ACID, PLASMA
Lactic Acid, Venous: 1.1 mmol/L (ref 0.5–1.9)
Lactic Acid, Venous: 1.8 mmol/L (ref 0.5–1.9)

## 2019-10-07 LAB — CBG MONITORING, ED: Glucose-Capillary: 154 mg/dL — ABNORMAL HIGH (ref 70–99)

## 2019-10-07 LAB — HIV ANTIBODY (ROUTINE TESTING W REFLEX): HIV Screen 4th Generation wRfx: NONREACTIVE

## 2019-10-07 MED ORDER — TRAMADOL HCL 50 MG PO TABS
50.0000 mg | ORAL_TABLET | Freq: Three times a day (TID) | ORAL | Status: DC | PRN
  Administered 2019-10-07 (×2): 50 mg via ORAL
  Filled 2019-10-07 (×3): qty 1

## 2019-10-07 MED ORDER — ACETAMINOPHEN 325 MG PO TABS
650.0000 mg | ORAL_TABLET | Freq: Four times a day (QID) | ORAL | Status: DC | PRN
  Administered 2019-10-07 (×2): 650 mg via ORAL
  Filled 2019-10-07 (×2): qty 2

## 2019-10-07 MED ORDER — ADULT MULTIVITAMIN W/MINERALS CH
1.0000 | ORAL_TABLET | Freq: Every day | ORAL | Status: DC
  Administered 2019-10-07 – 2019-10-10 (×4): 1 via ORAL
  Filled 2019-10-07 (×4): qty 1

## 2019-10-07 MED ORDER — ONDANSETRON HCL 4 MG/2ML IJ SOLN
4.0000 mg | Freq: Once | INTRAMUSCULAR | Status: DC
  Filled 2019-10-07: qty 2

## 2019-10-07 MED ORDER — INSULIN ASPART 100 UNIT/ML ~~LOC~~ SOLN
0.0000 [IU] | Freq: Three times a day (TID) | SUBCUTANEOUS | Status: DC
  Administered 2019-10-07: 2 [IU] via SUBCUTANEOUS
  Administered 2019-10-07: 3 [IU] via SUBCUTANEOUS
  Administered 2019-10-08: 2 [IU] via SUBCUTANEOUS

## 2019-10-07 MED ORDER — SODIUM CHLORIDE 0.9 % IV SOLN: INTRAVENOUS | Status: DC

## 2019-10-07 MED ORDER — DOCUSATE SODIUM 100 MG PO CAPS
100.0000 mg | ORAL_CAPSULE | Freq: Every day | ORAL | Status: DC
  Administered 2019-10-07 – 2019-10-10 (×4): 100 mg via ORAL
  Filled 2019-10-07 (×4): qty 1

## 2019-10-07 MED ORDER — ASPIRIN EC 81 MG PO TBEC
81.0000 mg | DELAYED_RELEASE_TABLET | Freq: Every day | ORAL | Status: DC
  Administered 2019-10-07 – 2019-10-10 (×4): 81 mg via ORAL
  Filled 2019-10-07 (×4): qty 1

## 2019-10-07 MED ORDER — ONDANSETRON HCL 4 MG PO TABS: 4.0000 mg | ORAL_TABLET | Freq: Four times a day (QID) | ORAL | Status: DC | PRN

## 2019-10-07 MED ORDER — ACETAMINOPHEN 650 MG RE SUPP: 650.0000 mg | Freq: Four times a day (QID) | RECTAL | Status: DC | PRN

## 2019-10-07 MED ORDER — SODIUM CHLORIDE 0.9 % IV SOLN
2.0000 g | INTRAVENOUS | Status: DC
  Administered 2019-10-07 – 2019-10-10 (×4): 2 g via INTRAVENOUS
  Filled 2019-10-07: qty 2
  Filled 2019-10-07: qty 20
  Filled 2019-10-07 (×2): qty 2

## 2019-10-07 MED ORDER — ONDANSETRON HCL 4 MG/2ML IJ SOLN: 4.0000 mg | Freq: Four times a day (QID) | INTRAMUSCULAR | Status: DC | PRN

## 2019-10-07 MED ORDER — INSULIN ASPART 100 UNIT/ML ~~LOC~~ SOLN
4.0000 [IU] | Freq: Three times a day (TID) | SUBCUTANEOUS | Status: DC
  Administered 2019-10-07 – 2019-10-09 (×8): 4 [IU] via SUBCUTANEOUS

## 2019-10-07 MED ORDER — INSULIN GLARGINE 100 UNIT/ML ~~LOC~~ SOLN
50.0000 [IU] | Freq: Every day | SUBCUTANEOUS | Status: DC
  Administered 2019-10-07 – 2019-10-09 (×3): 50 [IU] via SUBCUTANEOUS
  Filled 2019-10-07 (×4): qty 0.5

## 2019-10-07 MED ORDER — ONDANSETRON HCL 4 MG/2ML IJ SOLN
4.0000 mg | Freq: Once | INTRAMUSCULAR | Status: AC | PRN
  Administered 2019-10-07: 4 mg via INTRAVENOUS
  Filled 2019-10-07: qty 2

## 2019-10-07 MED ORDER — LEVOTHYROXINE SODIUM 112 MCG PO TABS
112.0000 ug | ORAL_TABLET | Freq: Every day | ORAL | Status: DC
  Administered 2019-10-07 – 2019-10-10 (×4): 112 ug via ORAL
  Filled 2019-10-07 (×4): qty 1

## 2019-10-07 MED ORDER — PRAVASTATIN SODIUM 40 MG PO TABS
40.0000 mg | ORAL_TABLET | Freq: Every evening | ORAL | Status: DC
  Administered 2019-10-07 – 2019-10-09 (×3): 40 mg via ORAL
  Filled 2019-10-07 (×3): qty 1

## 2019-10-07 MED ORDER — ENOXAPARIN SODIUM 40 MG/0.4ML ~~LOC~~ SOLN
40.0000 mg | SUBCUTANEOUS | Status: DC
  Administered 2019-10-07 – 2019-10-09 (×3): 40 mg via SUBCUTANEOUS
  Filled 2019-10-07 (×4): qty 0.4

## 2019-10-07 MED ORDER — CLINDAMYCIN PHOSPHATE 600 MG/50ML IV SOLN
600.0000 mg | Freq: Once | INTRAVENOUS | Status: AC
  Administered 2019-10-07: 600 mg via INTRAVENOUS
  Filled 2019-10-07: qty 50

## 2019-10-07 MED ORDER — METOPROLOL TARTRATE 25 MG PO TABS
25.0000 mg | ORAL_TABLET | Freq: Two times a day (BID) | ORAL | Status: DC
  Administered 2019-10-07 – 2019-10-10 (×7): 25 mg via ORAL
  Filled 2019-10-07 (×7): qty 1

## 2019-10-07 MED ORDER — VANCOMYCIN HCL IN DEXTROSE 1-5 GM/200ML-% IV SOLN
1000.0000 mg | Freq: Two times a day (BID) | INTRAVENOUS | Status: DC
  Administered 2019-10-07 – 2019-10-08 (×4): 1000 mg via INTRAVENOUS
  Filled 2019-10-07 (×5): qty 200

## 2019-10-07 NOTE — ED Notes (Signed)
Patient transported to X-ray 

## 2019-10-07 NOTE — Progress Notes (Signed)
Consult received.   Given the nature, extent, and duration of the wound as well as the patient's comorbidities, I have asked my colleague, Dr. Aldean Baker, for his assistance with further assessment and management given his expertise and experience in this domain. Either Dr. Lajoyce Corners or I will evaluate the patient tomorrow or Monday.   Left foot X-rays were not suggestive of underlying osteomyelitis. Continue rx for cellulitis. Full consultation to follow.  Myrene Galas, MD [facility], [facility] [phone]

## 2019-10-07 NOTE — H&P (Signed)
History and Physical    Christie Bryant DEY:[id number] DOB: 03-27-1955 DOA: 10/06/2019  PCP: Marisue Ivan, MD  Patient coming from: Home  I have personally briefly reviewed patient's old medical records in [facility]  Chief Complaint: Cellulitis  HPI: Christie Bryant is a 64 y.o. female with medical history significant of DM2, HTN.  Pt presents to the ED with c/o recurrent cellulitis in her LLE.  She has been treated twice this past month with 1 week courses of keflex.  Had some improvement with keflex but cellulitis rapidly returned.  Also had [place] for DVT which was neg on 9/15.  Has mild fever, no N/V, no diarrhea, no abd pain, slight cough onset today, no known sick contacts.   ED Course: Tm 100.9, WBC 12.3k.  Lactate 1.8.  Pt given clinda initially by EDP.  Hospitalist asked to admit.   Review of Systems: As per HPI, otherwise all review of systems negative.  Past Medical History:  Diagnosis Date  . Chicken pox   . Diabetes mellitus 2012   Dr. Tedd Sias  . Hyperlipidemia   . Hypertension   . Kidney stone   . Recurrent boils   . Thyroid disease     Past Surgical History:  Procedure Laterality Date  . ABDOMINAL HYSTERECTOMY  1998   complete  . BIOPSY THYROID     normal, Dr. Tedd Sias  . BREAST REDUCTION SURGERY    . COLONOSCOPY WITH PROPOFOL N/A 08/13/2014   Procedure: COLONOSCOPY WITH PROPOFOL;  Surgeon: Wallace Cullens, MD;  Location: [facility] ENDOSCOPY;  Service: Gastroenterology;  Laterality: N/A;  . COLONOSCOPY WITH PROPOFOL N/A 11/12/2014   Procedure: COLONOSCOPY WITH PROPOFOL;  Surgeon: Wallace Cullens, MD;  Location: [facility] ENDOSCOPY;  Service: Gastroenterology;  Laterality: N/A;  . gluteal abscess drainage  03/17/13  . TUBAL LIGATION       reports that she has never smoked. She has never used smokeless tobacco. She reports that she does not drink alcohol and does not use drugs.  No Known Allergies  Family History  Problem Relation Age of Onset  . Cancer Mother         ovarian/uterus  . Hyperlipidemia Father   . Heart disease Father   . Hypertension Father   . Cancer Father        throat  . Hypertension Sister   . Hypertension Brother   . Hypertension Sister   . Hypertension Sister   . Stroke Maternal Grandmother   . Hypertension Maternal Grandmother   . Stroke Maternal Grandfather   . Hypertension Maternal Grandfather   . Hypertension Other   . Heart disease Other   . Sudden death Other   . Diabetes Other      Prior to Admission medications   Medication Sig Start Date End Date Taking? Authorizing Provider  aspirin 81 MG tablet Take 81 mg by mouth daily.   Yes [provider]  Cholecalciferol (VITAMIN D-3 PO) Take 2,000 Units by mouth daily.    Yes [provider]  docusate sodium (COLACE) 100 MG capsule Take 100 mg by mouth daily.    Yes [provider]  FARXIGA 10 MG TABS tablet Take 10 mg by mouth every morning. 08/09/19  Yes [provider]  levothyroxine (SYNTHROID, LEVOTHROID) 112 MCG tablet TAKE ONE TABLET BY MOUTH ONCE DAILY Patient taking differently: Take 112 mcg by mouth daily before breakfast.  01/08/14  Yes Shelia Media, MD  metFORMIN (GLUCOPHAGE) 1000 MG tablet Take 1,000 mg  by mouth 2 (two) times daily with a meal.   Yes [provider]  metoprolol (LOPRESSOR) 100 MG tablet TAKE ONE TABLET BY MOUTH TWICE DAILY Patient taking differently: Take 100 mg by mouth 2 (two) times daily.  09/14/14  Yes Shelia Media, MD  Multiple Vitamin (MULTIVITAMIN) capsule Take 1 capsule by mouth daily.   Yes [provider]  pravastatin (PRAVACHOL) 40 MG tablet TAKE ONE TABLET BY MOUTH ONCE DAILY Patient taking differently: Take 40 mg by mouth every evening.  05/22/15  Yes Shelia Media, MD  TRESIBA FLEXTOUCH 200 UNIT/ML FlexTouch Pen Inject 60-100 Units into the skin at bedtime.  09/19/19  Yes [provider]  triamterene-hydrochlorothiazide (MAXZIDE) 75-50 MG per tablet TAKE  ONE-HALF TABLET BY MOUTH ONCE DAILY Patient taking differently: Take 0.5 tablets by mouth daily.  03/05/14  Yes Shelia Media, MD    Physical Exam: Vitals:   10/07/19 0300 10/07/19 0315 10/07/19 0330 10/07/19 0345  BP: 125/63 132/61 (!) 131/58 (!) 135/59  Pulse: 96 (!) 102 (!) 101 96  Resp:      Temp:      TempSrc:      SpO2: 93% 96% 98% 97%  Weight:      Height:        Constitutional: NAD, calm, comfortable Eyes: PERRL, lids and conjunctivae normal ENMT: Mucous membranes are moist. Posterior pharynx clear of any exudate or lesions.Normal dentition.  Neck: normal, supple, no masses, no thyromegaly Respiratory: clear to auscultation bilaterally, no wheezing, no crackles. Normal respiratory effort. No accessory muscle use.  Cardiovascular: Regular rate and rhythm, no murmurs / rubs / gallops. No extremity edema. 2+ pedal pulses. No carotid bruits.  Abdomen: no tenderness, no masses palpated. No hepatosplenomegaly. Bowel sounds positive.  Musculoskeletal: no clubbing / cyanosis. No joint deformity upper and lower extremities. Good ROM, no contractures. Normal muscle tone.  Skin: Erythema of L leg, eminating from L great toe.  No gross open ulcer or wound but has skin cracking present. Neurologic: CN 2-12 grossly intact. Sensation intact, DTR normal. Strength 5/5 in all 4.  Psychiatric: Normal judgment and insight. Alert and oriented x 3. Normal mood.    Labs on Admission: I have personally reviewed following labs and imaging studies  CBC: Recent Labs  Lab 10/06/19 2349  WBC 12.3*  NEUTROABS 10.1*  HGB 13.0  HCT 41.0  MCV 91.3  PLT 190   Basic Metabolic Panel: Recent Labs  Lab 10/06/19 2349  NA 134*  K 3.7  CL 101  CO2 19*  GLUCOSE 198*  BUN 18  CREATININE 1.14*  CALCIUM 9.6   GFR: Estimated Creatinine Clearance: 63.5 mL/min (A) (by C-G formula based on SCr of 1.14 mg/dL (H)). Liver Function Tests: Recent Labs  Lab 10/06/19 2349  AST 27  ALT 24  ALKPHOS  62  BILITOT 1.5*  PROT 7.7  ALBUMIN 3.8   No results for input(s): LIPASE, AMYLASE in the last 168 hours. No results for input(s): AMMONIA in the last 168 hours. Coagulation Profile: Recent Labs  Lab 10/06/19 2349  INR 1.0   Cardiac Enzymes: No results for input(s): CKTOTAL, CKMB, CKMBINDEX, TROPONINI in the last 168 hours. BNP (last 3 results) No results for input(s): PROBNP in the last 8760 hours. HbA1C: No results for input(s): HGBA1C in the last 72 hours. CBG: No results for input(s): GLUCAP in the last 168 hours. Lipid Profile: No results for input(s): CHOL, HDL, LDLCALC, TRIG, CHOLHDL, LDLDIRECT in the last 72 hours. Thyroid  Function Tests: No results for input(s): TSH, T4TOTAL, FREET4, T3FREE, THYROIDAB in the last 72 hours. Anemia Panel: No results for input(s): VITAMINB12, FOLATE, FERRITIN, TIBC, IRON, RETICCTPCT in the last 72 hours. Urine analysis:    Component Value Date/Time   COLORURINE YELLOW 10/07/2019 0209   APPEARANCEUR CLEAR 10/07/2019 0209   LABSPEC 1.026 10/07/2019 0209   PHURINE 5.0 10/07/2019 0209   GLUCOSEU >=500 (A) 10/07/2019 0209   HGBUR NEGATIVE 10/07/2019 0209   BILIRUBINUR NEGATIVE 10/07/2019 0209   KETONESUR NEGATIVE 10/07/2019 0209   PROTEINUR NEGATIVE 10/07/2019 0209   NITRITE NEGATIVE 10/07/2019 0209   LEUKOCYTESUR NEGATIVE 10/07/2019 0209    Radiological Exams on Admission: DG Chest 2 View  Result Date: 10/07/2019 CLINICAL DATA:  Suspected sepsis EXAM: CHEST - 2 VIEW COMPARISON:  None. FINDINGS: Streaky opacities in the bases favoring some atelectatic change. No consolidation, features of edema, pneumothorax, or effusion. The cardiomediastinal contours are unremarkable. No acute osseous or soft tissue abnormality. Multilevel degenerative changes are present in the imaged portions of the spine. IMPRESSION: Streaky opacities in the bases favoring atelectasis. No other acute cardiopulmonary abnormality. Electronically Signed   By: Kreg Shropshire M.D.   On: 10/07/2019 00:32    EKG: Independently reviewed.  Assessment/Plan Principal Problem:   Cellulitis of left foot Active Problems:   Hypertension   DM type 2 with diabetic peripheral neuropathy (HCC)   Sepsis due to cellulitis (HCC)    1. Cellulitis of L foot, ankle, leg with mild systemic sepsis - 1. Cellulitis pathway 2. 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 3. Will put pt on empiric rocephin + Vanc for MRSA coverage 4. MRSA PCR nares 5. BCx pending 6. Tylenol PRN fever 2. HTN - 1. Holding home BP meds for the moment 3. DM2 - 1. Hold home PO hypoglycemics 2. Lantus 50u QHS 3. 4u novolog mealtime 4. Mod scale SSI  DVT prophylaxis: Lovenox Code Status: Full Family Communication: No family in room Disposition Plan: Home after cellulitis improves, possibly with ongoing IV ABx Consults called: None Admission status: Admit to inpatient  Severity of Illness: The appropriate patient status for this patient is INPATIENT. Inpatient status is judged to be reasonable and necessary in order to provide the required intensity of service to ensure the patient's safety. The patient's presenting symptoms, physical exam findings, and initial radiographic and laboratory data in the context of their chronic comorbidities is felt to place them at high risk for further clinical deterioration. Furthermore, it is not anticipated that the patient will be medically stable for discharge from the hospital within 2 midnights of admission. The following factors support the patient status of inpatient.   IP status as patient has failed outpatient therapy for cellulitis (2 courses of keflex over the past month).  * I certify that at the point of admission it is my clinical judgment that the patient will require inpatient hospital care spanning beyond 2 midnights from the point of admission due to high intensity of service, high risk for further deterioration and high frequency  of surveillance required.*    Octavio Matheney M. DO [facility]  How to contact the [facility] Attending or Consulting provider 7A - 7P or covering provider during after hours 7P -7A, for this patient?  1. Check the care team in [facility] and look for a) attending/consulting [facility] provider listed and b) the [facility] team listed 2. Log into [email or web address]  Amion Physician Scheduling and messaging for groups and whole hospitals  On  call and physician scheduling software for group practices, residents, hospitalists and other medical providers for call, clinic, rotation and shift schedules. OnCall Enterprise is a hospital-wide system for scheduling doctors and paging doctors on call. EasyPlot is for scientific plotting and data analysis.  [email or web address]  and use [place]'s universal password to access. If you do not have the password, please contact the hospital operator.  3. Locate the [facility] provider you are looking for under [facility] and page to a number that you can be directly reached. 4. If you still have difficulty reaching the provider, please page the [facility] (Director on Call) for the Hospitalists listed on amion for assistance.  10/07/2019, 5:39 AM

## 2019-10-07 NOTE — ED Notes (Signed)
Pt took 5 500mg  tylenol at home to stop a headache.  She has swelling to left leg, is warm and painful to the touch.  Right big toe appears to be red, swollen and is also painful to the touch.  Pt is alert and oriented at this time.

## 2019-10-07 NOTE — Progress Notes (Signed)
Pharmacy Antibiotic Note  POLLYANN ROA is a 64 y.o. female admitted on 10/06/2019 with cellulitis.  Pharmacy has been consulted for Vancomycin dosing. WBC mildly elevated. Renal function ok.   Plan: Vancomycin 1000 mg IV q12h Ceftriaxone per MD Trend WBC, temp, renal function  F/U infectious work-up Drug levels as indicated   Height: 5\' 10"  (177.8 cm) Weight: 104.3 kg (230 lb) IBW/kg (Calculated) : 68.5  Temp (24hrs), Avg:100.3 F (37.9 C), Min:99.7 F (37.6 C), Max:100.9 F (38.3 C)  Recent Labs  Lab 10/06/19 2349  WBC 12.3*  CREATININE 1.14*  LATICACIDVEN 1.8    Estimated Creatinine Clearance: 63.5 mL/min (A) (by C-G formula based on SCr of 1.14 mg/dL (H)).    No Known Allergies  10/08/19, PharmD, BCPS Clinical Pharmacist Phone: [phone]

## 2019-10-07 NOTE — ED Notes (Signed)
Breakfast Ordered 

## 2019-10-07 NOTE — Consult Note (Signed)
WOC Nurse Consult Note: Reason for Consult:Patient with recurring cellulitis to left foot. WOC is consulted for topical care guidance for left foot, LGT. No wound, erythematous, edematous, with partial thickness "cracks" in the skin. Wound type:infectious Pressure Injury POA: N/A Measurement:Bedside RN to obtain prior to performing first dressing change today Wound bed:N/A Drainage (amount, consistency, odor) N/A Periwound:erythematous, edematous Dressing procedure/placement/frequency: I will provide Nursing with topical care guidance for the care of the skin using a soap and water cleanse, rinse and topical dressing of xeroform (an antimicrobial nonadherent). The patient's foot is to be elevated. The patient is to begin Vancomycin today per Pharmacy note.  WOC nursing team will not follow, but will remain available to this patient, the nursing and medical teams.  Please re-consult if needed. Thanks, Ladona Mow, MSN, RN, GNP, [place]  Pager# [phone]

## 2019-10-07 NOTE — Progress Notes (Signed)
PROGRESS NOTE  Christie Bryant  DOB: 03-Feb-1955  PCP: Christie Ivan, MD XBJ:[id number]  DOA: 10/06/2019  LOS: 0 days   Chief Complaint  Patient presents with  . Blood Infection    possible   Brief narrative: Christie Bryant is a 64 y.o. female with PMH of DM2, HTN, HLD, renal stone, hypothyroidism. Patient presented to the ED on 10/06/2019 with complaint of worsening of the left foot wound. She noticed a blister on the undersurface of her great toe few weeks ago.  She does not know if it popped open without her knowledge.  3 weeks ago, she started having swelling, redness and pain locally at the left great toe which extended ultimately to include her foot up to mid leg.  She saw vascular surgeon at [facility], had a negative DVT scan on 9/15.  She was placed on a course of Keflex after which her symptoms recur again and completed another week of Keflex only to have symptoms back again.  She reported fever of 102 at home, associated with weakness and dizziness.   For last few days, left leg pain has extended up to her thigh and groin as well.  In the ED, temperature 100.9, heart rate 115, blood pressure stable Labs with WBC count elevated to 12.3, lactic acid normal at 1.8, creatinine 1.14, blood glucose level 198. RVP including Covid PCR and influenza PCR negative Urinalysis with clear yellow urine, negative for leukocytes or nitrate.  Patient was admitted under hospital service for further evaluation management  Subjective: Patient was seen and examined this morning.  Waiting in the ED for inpatient bed availability. Not in distress. See does not see podiatrist as an outpatient. Chart reviewed.  No recurrence of fever.  Tachycardia persists.  Blood pressure remains stable Labs pending this morning  Assessment/Plan: Sepsis - POA Cellulitis of left foot Diabetic foot wound -Presented with fever, tachycardia, leukocytosis due to nonhealing left foot wound not responsive to outpatient  antibiotics. -On my examination this morning, I noticed that she has grossly red and swollen left great toe with black eschar on the undersurface.  Redness extends up to the mid leg.  There is a streak of redness in the medial aspect of thigh extending proximally. -I did not see any imaging obtained in the ED.  We will start with a x-ray of the left foot.  May need an MRI as well.  -Orthopedics consulted. -Blood culture sent. Currently on IV Rocephin and IV vancomycin per cellulitis order set. -Currently normal saline at 125 mill per hour.  We will continue the same -Tylenol as needed for fever  DM2 Hyperglycemia -Pending A1c -Home meds include Tresiba daily, Farxiga 10 mg daily, Metformin 1000 mg twice daily, -Currently on Lantus 50 units nightly along with Premeal scheduled and sliding scale insulin. -Monitor Accu-Cheks. Lab Results  Component Value Date   HGBA1C 9.4 (A) 01/22/2014   Recent Labs  Lab 10/07/19 0754  GLUCAP 154*   Essential hypertension -Home meds include metoprolol 100 mg twice daily, Maxzide 75/50 mg, half tab daily. -Resume metoprolol at 25 mg twice daily.  Keep Maxide on hold.  Continue to monitor blood pressure. -Continue aspirin and statin  Mobility: May need PT eval Code Status:   Code Status: Full Code  Nutritional having swelling: Body mass index is 33 kg/m.     Diet Order            Diet Carb Modified Fluid consistency: Thin; Room service appropriate? Yes  Diet effective  now                 DVT prophylaxis: enoxaparin (LOVENOX) injection 40 mg Start: 10/07/19 1000   Antimicrobials:  IV Rocephin/IV vancomycin Fluid: Normal saline at 125 mL/h Consultants: Orthopedics Family Communication:  Not at bedside  Status is: Inpatient  Remains inpatient appropriate because:Ongoing diagnostic testing needed not appropriate for outpatient work up and IV treatments appropriate due to intensity of illness or inability to take PO   Dispo: The patient  is from: Home              Anticipated d/c is to: Home              Anticipated d/c date is: 3 days              Patient currently is not medically stable to d/c.       Infusions:  . sodium chloride 125 mL/hr at 10/07/19 0332  . cefTRIAXone (ROCEPHIN)  IV Stopped (10/07/19 0750)  . vancomycin Stopped (10/07/19 0945)    Scheduled Meds: . aspirin EC  81 mg Oral Daily  . docusate sodium  100 mg Oral Daily  . enoxaparin (LOVENOX) injection  40 mg Subcutaneous Q24H  . insulin aspart  0-15 Units Subcutaneous TID WC  . insulin aspart  4 Units Subcutaneous TID WC  . insulin glargine  50 Units Subcutaneous QHS  . levothyroxine  112 mcg Oral Q0600  . metoprolol tartrate  25 mg Oral BID  . multivitamin with minerals  1 tablet Oral Daily  . pravastatin  40 mg Oral QPM    Antimicrobials: Anti-infectives (From admission, onward)   Start     Dose/Rate Route Frequency Ordered Stop   10/07/19 0600  cefTRIAXone (ROCEPHIN) 2 g in sodium chloride 0.9 % 100 mL IVPB        2 g 200 mL/hr over 30 Minutes Intravenous Every 24 hours 10/07/19 0524     10/07/19 0600  vancomycin (VANCOCIN) IVPB 1000 mg/200 mL premix        1,000 mg 200 mL/hr over 60 Minutes Intravenous Every 12 hours 10/07/19 0541     10/07/19 0300  clindamycin (CLEOCIN) IVPB 600 mg        600 mg 100 mL/hr over 30 Minutes Intravenous  Once 10/07/19 0246 10/07/19 0411      PRN meds: acetaminophen **OR** acetaminophen, ondansetron **OR** ondansetron (ZOFRAN) IV   Objective: Vitals:   10/07/19 1000 10/07/19 1042  BP: (!) 143/57 (!) 128/52  Pulse: (!) 117 88  Resp:  16  Temp:    SpO2: 93% 95%   No intake or output data in the 24 hours ending 10/07/19 1051 Filed Weights   10/07/19 0003  Weight: 104.3 kg   Weight change:  Body mass index is 33 kg/m.   Physical Exam: General exam: Appears calm and comfortable.  Not in physical distress Skin: No rashes, lesions or ulcers. HEENT: Atraumatic, normocephalic, supple neck,  no obvious bleeding Lungs: Clear to auscultation bilaterally CVS: Regular rhythm, tachycardic, no murmur GI/Abd soft, nontender, nondistended, bowel sound present CNS: Alert, awake, oriented x3 Psychiatry: Mood appropriate Extremities:  grossly red and swollen left great toe with black eschar on the undersurface.  Redness extends up to the mid leg.  There is a streak of redness in the medial aspect of thigh extending proximally.  Right leg normal  Data Review: I have personally reviewed the laboratory data and studies available.  Recent Labs  Lab 10/06/19 2349  WBC 12.3*  NEUTROABS 10.1*  HGB 13.0  HCT 41.0  MCV 91.3  PLT 190   Recent Labs  Lab 10/06/19 2349  NA 134*  K 3.7  CL 101  CO2 19*  GLUCOSE 198*  BUN 18  CREATININE 1.14*  CALCIUM 9.6    F/u labs ordered  Signed, Lorin Glass, MD [facility] 10/07/2019

## 2019-10-07 NOTE — ED Provider Notes (Signed)
[facility] EMERGENCY DEPARTMENT Provider Note  CSN: [id number] Arrival date & time: 10/06/19 2321  Chief Complaint(s) Blood Infection (possible)  HPI Christie Bryant is a 64 y.o. female with a history of diabetes and recurrent left lower extremity cellulitis presents with fever and recurrence of her left lower extremity cellulitis.  She reports that over the past several weeks she has been treated twice with a 1 week course of Keflex for her cellulitis.  Patient reports that she was also checked for possible blood clots several weeks ago.  Noted a negative DVT ultrasound performed on 9/15.  Patient endorses slight cough that started today.  She feels tired.  No nausea or vomiting.  No diarrhea.  No abdominal pain.  No urinary symptoms.  No known sick contacts.  HPI  Past Medical History Past Medical History:  Diagnosis Date   Chicken pox    Diabetes mellitus 2012   Dr. 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/05/2019   DM type 2 with diabetic peripheral neuropathy (HCC) 08/03/2018   Back pain 07/07/2014   Cellulitis and abscess of face 03/13/2014   Dermatitis 05/08/2013   Routine general medical examination at a health care facility 12/05/2012   Shingles outbreak 12/28/2011   MRSA infection 09/16/2011   Screening for breast cancer 09/08/2011   Diabetes mellitus type 2, uncontrolled (HCC) 09/08/2011   Hypertension 09/08/2011   Hypothyroidism 09/08/2011   Hyperlipidemia with target low density lipoprotein (LDL) cholesterol less than 70 mg/dL 31/54/0086   Neuropathy in diabetes (HCC) 09/08/2011   Home Medication(s) Prior to Admission medications   Medication Sig Start Date End Date Taking? Authorizing Provider  aspirin 81 MG tablet Take  81 mg by mouth daily.   Yes [provider]  Cholecalciferol (VITAMIN D-3 PO) Take 2,000 Units by mouth daily.    Yes [provider]  docusate sodium (COLACE) 100 MG capsule Take 100 mg by mouth daily.    Yes [provider]  FARXIGA 10 MG TABS tablet Take 10 mg by mouth every morning. 08/09/19  Yes [provider]  levothyroxine (SYNTHROID, LEVOTHROID) 112 MCG tablet TAKE ONE TABLET BY MOUTH ONCE DAILY Patient taking differently: Take 112 mcg by mouth daily before breakfast.  01/08/14  Yes Shelia Media, MD  metFORMIN (GLUCOPHAGE) 1000 MG tablet Take 1,000 mg by mouth 2 (two) times daily with a meal.   Yes [provider]  metoprolol (LOPRESSOR) 100 MG tablet TAKE ONE TABLET BY MOUTH TWICE DAILY Patient taking differently: Take 100 mg by mouth 2 (two) times daily.  09/14/14  Yes Shelia Media, MD  Multiple Vitamin (MULTIVITAMIN) capsule Take 1 capsule by mouth daily.   Yes [provider]  pravastatin (PRAVACHOL) 40 MG tablet TAKE ONE TABLET BY MOUTH ONCE DAILY Patient taking differently: Take 40 mg by mouth every evening.  05/22/15  Yes Shelia Media, MD  TRESIBA FLEXTOUCH 200 UNIT/ML FlexTouch Pen Inject 60-100 Units into the skin at bedtime.  09/19/19  Yes [provider]  triamterene-hydrochlorothiazide (MAXZIDE) 75-50 MG per tablet TAKE ONE-HALF TABLET BY MOUTH ONCE DAILY Patient taking differently: Take 0.5 tablets by mouth daily.  03/05/14  Yes Shelia Media, MD  cephALEXin (KEFLEX) 500 MG  capsule Take 1 capsule (500 mg total) by mouth 3 (three) times daily. Patient not taking: Reported on 10/07/2019 09/19/19   Georgiana SpinnerBrown, Fallon E, NP  cyclobenzaprine (FLEXERIL) 10 MG tablet Take 0.5-1 tablets (5-10 mg total) by mouth 3 (three) times daily as needed for muscle spasms. Patient not taking: Reported on 09/05/2019 07/06/14   Joaquim Namuncan, Graham S, MD  gabapentin (NEURONTIN) 100 MG capsule Take 1-3 capsules (100-300 mg total) by  mouth at bedtime as needed (pain). Patient not taking: Reported on 09/05/2019 07/09/14   Joaquim Namuncan, Graham S, MD  metFORMIN (GLUCOPHAGE-XR) 500 MG 24 hr tablet Take 2 tablets (1,000 mg total) by mouth 2 (two) times daily. Patient not taking: Reported on 10/07/2019 11/12/14   Shelia MediaWalker, Jennifer A, MD  predniSONE (DELTASONE) 20 MG tablet 40mg  a day for 5 days, then 20mg  a day for 5 days.  With food. Patient not taking: Reported on 09/05/2019 07/09/14   Joaquim Namuncan, Graham S, MD                                                                                                                                    Past Surgical History Past Surgical History:  Procedure Laterality Date   ABDOMINAL HYSTERECTOMY  1998   complete   BIOPSY THYROID     normal, Dr. Tedd SiasSolum   BREAST REDUCTION SURGERY     COLONOSCOPY WITH PROPOFOL N/A 08/13/2014   Procedure: COLONOSCOPY WITH PROPOFOL;  Surgeon: Wallace CullensPaul Y Oh, MD;  [facility];  Service: Gastroenterology;  Laterality: N/A;   COLONOSCOPY WITH PROPOFOL N/A 11/12/2014   Procedure: COLONOSCOPY WITH PROPOFOL;  Surgeon: Wallace CullensPaul Y Oh, MD;  [facility];  Service: Gastroenterology;  Laterality: N/A;   gluteal abscess drainage  03/17/13   TUBAL LIGATION     Family History Family History  Problem Relation Age of Onset   Cancer Mother        ovarian/uterus   Hyperlipidemia Father    Heart disease Father    Hypertension Father    Cancer Father        throat   Hypertension Sister    Hypertension Brother    Hypertension Sister    Hypertension Sister    Stroke Maternal Grandmother    Hypertension Maternal Grandmother    Stroke Maternal Grandfather    Hypertension Maternal Grandfather    Hypertension Other    Heart disease Other    Sudden death Other    Diabetes Other     Social History Social History   Tobacco Use   Smoking status: Never Smoker   Smokeless tobacco: Never Used  Substance Use Topics   Alcohol use: No   Drug  use: No   Allergies Patient has no known allergies.  Review of Systems Review of Systems All other systems are reviewed and are negative for acute change except as noted in the HPI  Physical Exam Vital Signs  I have reviewed the  triage vital signs BP (!) 124/44    Pulse 99    Temp 99.7 F (37.6 C) (Oral)    Resp 18    Ht  (1.778 m)    Wt 104.3 kg    SpO2 92%    BMI 33.00 kg/m   Physical Exam Vitals reviewed.  Constitutional:      General: She is not in acute distress.    Appearance: She is well-developed. She is not diaphoretic.  HENT:     Head: Normocephalic and atraumatic.     Nose: Nose normal.  Eyes:     General: No scleral icterus.       Right eye: No discharge.        Left eye: No discharge.     Conjunctiva/sclera: Conjunctivae normal.     Pupils: Pupils are equal, round, and reactive to light.  Cardiovascular:     Rate and Rhythm: Normal rate and regular rhythm.     Heart sounds: No murmur heard.  No friction rub. No gallop.   Pulmonary:     Effort: Pulmonary effort is normal. No respiratory distress.     Breath sounds: Normal breath sounds. No stridor or decreased air movement. No wheezing, rhonchi or rales.  Abdominal:     General: There is no distension.     Palpations: Abdomen is soft.     Tenderness: There is no abdominal tenderness.  Musculoskeletal:        General: No tenderness.     Cervical back: Normal range of motion and neck supple.       Legs:  Skin:    General: Skin is warm and dry.     Findings: No erythema or rash.  Neurological:     Mental Status: She is alert and oriented to person, place, and time.     ED Results and Treatments Labs (all labs ordered are listed, but only abnormal results are displayed) Labs Reviewed  COMPREHENSIVE METABOLIC PANEL - Abnormal; Notable for the following components:      Result Value   Sodium 134 (*)    CO2 19 (*)    Glucose, Bld 198 (*)    Creatinine, Ser 1.14 (*)    Total Bilirubin 1.5 (*)     GFR calc non Af Amer 50 (*)    GFR calc Af Amer 58 (*)    All other components within normal limits  CBC WITH DIFFERENTIAL/PLATELET - Abnormal; Notable for the following components:   WBC 12.3 (*)    RDW 15.6 (*)    Neutro Abs 10.1 (*)    All other components within normal limits  URINALYSIS, ROUTINE W REFLEX MICROSCOPIC - Abnormal; Notable for the following components:   Glucose, UA >=500 (*)    All other components within normal limits  CULTURE, BLOOD (ROUTINE X 2)  CULTURE, BLOOD (ROUTINE X 2)  RESPIRATORY PANEL BY RT PCR (FLU A&B, COVID)  LACTIC ACID, PLASMA  PROTIME-INR  LACTIC ACID, PLASMA  EKG  EKG Interpretation  Date/Time:  Friday October 06 2019 23:38:06 EDT Ventricular Rate:  113 PR Interval:  158 QRS Duration: 102 QT Interval:  328 QTC Calculation: 449 R Axis:   -101 Text Interpretation: Sinus tachycardia Possible Anterolateral infarct , age undetermined Abnormal ECG Otherwise no significant change Confirmed by Drema Pry [id number]) on 10/07/2019 2:34:59 AM      Radiology DG Chest 2 View  Result Date: 10/07/2019 CLINICAL DATA:  Suspected sepsis EXAM: CHEST - 2 VIEW COMPARISON:  None. FINDINGS: Streaky opacities in the bases favoring some atelectatic change. No consolidation, features of edema, pneumothorax, or effusion. The cardiomediastinal contours are unremarkable. No acute osseous or soft tissue abnormality. Multilevel degenerative changes are present in the imaged portions of the spine. IMPRESSION: Streaky opacities in the bases favoring atelectasis. No other acute cardiopulmonary abnormality. Electronically Signed   By: Kreg Shropshire M.D.   On: 10/07/2019 00:32    Pertinent labs & imaging results that were available during my care of the patient were reviewed by me and considered in my medical decision making (see chart for  details).  Medications Ordered in ED Medications  0.9 %  sodium chloride infusion ( Intravenous New Bag/Given 10/07/19 0332)  ondansetron (ZOFRAN) injection 4 mg (has no administration in time range)  ondansetron (ZOFRAN) injection 4 mg (4 mg Intravenous Given 10/07/19 0010)  clindamycin (CLEOCIN) IVPB 600 mg (600 mg Intravenous New Bag/Given 10/07/19 0341)                                                                                                                                    Procedures Procedures  (including critical care time)  Medical Decision Making / ED Course I have reviewed the nursing notes for this encounter and the patient's prior records (if available in EHR or on provided paperwork).   Elaf Clauson Buttrey was evaluated in Emergency Department on 10/07/2019 for the symptoms described in the history of present illness. She was evaluated in the context of the global COVID-19 pandemic, which necessitated consideration that the patient might be at risk for infection with the SARS-CoV-2 virus that causes COVID-19. Institutional protocols and algorithms that pertain to the evaluation of patients at risk for COVID-19 are in a state of rapid change based on information released by regulatory bodies including the CDC and federal and state organizations. These policies and algorithms were followed during the patient's care in the ED.  Recurrent left lower extremity cellulitis.  Patient is tachycardic and febrile with leukocytosis.  Lactic acid normal.  Does not meet sepsis criteria.  But will require admission for IV antibiotics.  Ruling out other sources of infection.    Chest x-ray without evidence of pneumonia and lungs clear to auscultation bilaterally.  Covid/influenza still pending.  UA without evidence of infection.      Final Clinical Impression(s) / ED Diagnoses Final diagnoses:  Cellulitis of left lower extremity  This chart was dictated using voice recognition  software.  Despite best efforts to proofread,  errors can occur which can change the documentation meaning.   Nira Conn, MD 10/07/19 [phone]

## 2019-10-08 LAB — CBC WITH DIFFERENTIAL/PLATELET
Abs Immature Granulocytes: 0.06 10*3/uL (ref 0.00–0.07)
Basophils Absolute: 0 10*3/uL (ref 0.0–0.1)
Basophils Relative: 0 %
Eosinophils Absolute: 0.1 10*3/uL (ref 0.0–0.5)
Eosinophils Relative: 1 %
HCT: 32.9 % — ABNORMAL LOW (ref 36.0–46.0)
Hemoglobin: 10.6 g/dL — ABNORMAL LOW (ref 12.0–15.0)
Immature Granulocytes: 1 %
Lymphocytes Relative: 14 %
Lymphs Abs: 1.5 10*3/uL (ref 0.7–4.0)
MCH: 29.5 pg (ref 26.0–34.0)
MCHC: 32.2 g/dL (ref 30.0–36.0)
MCV: 91.6 fL (ref 80.0–100.0)
Monocytes Absolute: 1 10*3/uL (ref 0.1–1.0)
Monocytes Relative: 10 %
Neutro Abs: 7.7 10*3/uL (ref 1.7–7.7)
Neutrophils Relative %: 74 %
Platelets: 158 10*3/uL (ref 150–400)
RBC: 3.59 MIL/uL — ABNORMAL LOW (ref 3.87–5.11)
RDW: 15.9 % — ABNORMAL HIGH (ref 11.5–15.5)
WBC: 10.3 10*3/uL (ref 4.0–10.5)
nRBC: 0 % (ref 0.0–0.2)

## 2019-10-08 LAB — BASIC METABOLIC PANEL
Anion gap: 9 (ref 5–15)
BUN: 11 mg/dL (ref 8–23)
CO2: 21 mmol/L — ABNORMAL LOW (ref 22–32)
Calcium: 8.3 mg/dL — ABNORMAL LOW (ref 8.9–10.3)
Chloride: 103 mmol/L (ref 98–111)
Creatinine, Ser: 0.85 mg/dL (ref 0.44–1.00)
GFR calc Af Amer: >60 mL/min (ref >60)
GFR calc non Af Amer: >60 mL/min (ref >60)
Glucose, Bld: 125 mg/dL — ABNORMAL HIGH (ref 70–99)
Potassium: 3.4 mmol/L — ABNORMAL LOW (ref 3.5–5.1)
Sodium: 133 mmol/L — ABNORMAL LOW (ref 135–145)

## 2019-10-08 LAB — VANCOMYCIN, TROUGH: Vancomycin Tr: 11 ug/mL — ABNORMAL LOW (ref 15–20)

## 2019-10-08 LAB — MAGNESIUM: Magnesium: 2.1 mg/dL (ref 1.7–2.4)

## 2019-10-08 LAB — PHOSPHORUS: Phosphorus: 2.4 mg/dL — ABNORMAL LOW (ref 2.5–4.6)

## 2019-10-08 LAB — GLUCOSE, CAPILLARY
Glucose-Capillary: 106 mg/dL — ABNORMAL HIGH (ref 70–99)
Glucose-Capillary: 124 mg/dL — ABNORMAL HIGH (ref 70–99)
Glucose-Capillary: 103 mg/dL — ABNORMAL HIGH (ref 70–99)
Glucose-Capillary: 128 mg/dL — ABNORMAL HIGH (ref 70–99)

## 2019-10-08 MED ORDER — VANCOMYCIN HCL 1250 MG/250ML IV SOLN
1250.0000 mg | Freq: Two times a day (BID) | INTRAVENOUS | Status: DC
  Administered 2019-10-09: 1250 mg via INTRAVENOUS
  Filled 2019-10-08 (×3): qty 250

## 2019-10-08 MED ORDER — BENZONATATE 100 MG PO CAPS
100.0000 mg | ORAL_CAPSULE | Freq: Two times a day (BID) | ORAL | Status: DC
  Administered 2019-10-08 – 2019-10-10 (×5): 100 mg via ORAL
  Filled 2019-10-08 (×5): qty 1

## 2019-10-08 MED ORDER — K PHOS MONO-SOD PHOS DI & MONO 155-852-130 MG PO TABS
500.0000 mg | ORAL_TABLET | Freq: Once | ORAL | Status: AC
  Administered 2019-10-08: 500 mg via ORAL
  Filled 2019-10-08: qty 2

## 2019-10-08 NOTE — Progress Notes (Signed)
PROGRESS NOTE  Christie Bryant  DOB: 01-Mar-1955  PCP: Marisue Ivan, MD UTM:[id number]  DOA: 10/06/2019  LOS: 1 day   Chief Complaint  Patient presents with  . Blood Infection    possible   Brief narrative: Christie Bryant is a 64 y.o. female with PMH of DM2, HTN, HLD, renal stone, hypothyroidism. Patient presented to the ED on 10/06/2019 with complaint of worsening of the left foot wound. She noticed a blister on the undersurface of her great toe few weeks ago.  She does not know if it popped open without her knowledge.  3 weeks ago, she started having swelling, redness and pain locally at the left great toe which extended ultimately to include her foot up to mid leg.  She saw vascular surgeon at [facility], had a negative DVT scan on 9/15.  She was placed on a course of Keflex after which her symptoms recur again and completed another week of Keflex only to have symptoms back again.  She reported fever of 102 at home, associated with weakness and dizziness.   For last few days, left leg pain has extended up to her thigh and groin as well.  In the ED, temperature 100.9, heart rate 115, blood pressure stable Labs with WBC count elevated to 12.3, lactic acid normal at 1.8, creatinine 1.14, blood glucose level 198. RVP including Covid PCR and influenza PCR negative Urinalysis with clear yellow urine, negative for leukocytes or nitrate.  Patient was admitted under hospital service for further evaluation management  Subjective: Patient was seen and examined this morning.   Lying on bed.  Not in distress.  No pain at rest but tender to touch and throbbing pain on walking.   Last fever was 101 yesterday morning.  Labs from this morning showed potassium 3.4 and phosphorus 2.4. Patient also complains intermittent episodes of dry cough.  Assessment/Plan: Sepsis - POA Cellulitis of left foot Diabetic foot wound -Presented with fever, tachycardia, leukocytosis due to nonhealing left foot wound  not responsive to outpatient antibiotics. -X-ray of left foot shows cellulitis with no bony involvement. -On my examination, patient has grossly red and swollen left great toe with black eschar on the undersurface.  Redness extends up to the mid leg.  There is a streak of redness in the medial aspect of thigh extending proximally.  I have high suspicion of osteomyelitis.  May need MRI.  Orthopedics consulted.  Dr. Lajoyce Corners to see today or tomorrow. -Blood culture sent. Currently on IV Rocephin and IV vancomycin per cellulitis order set. -Okay to cut down IV fluid rate to 50 mill per hour. -Tylenol as needed for fever  Poorly controlled DM2 -A1c 7.7 on 9/25. Hyperglycemia -Home meds include Tresiba daily, Farxiga 10 mg daily, Metformin 1000 mg twice daily, -Currently blood sugar level is controlled on on Lantus 50 units nightly along with Premeal scheduled and sliding scale insulin. -Monitor Accu-Cheks. Recent Labs  Lab 10/07/19 0754 10/07/19 1159 10/07/19 1622 10/07/19 2147 10/08/19 0625  GLUCAP 154* 193* 146* 130* 106*   Essential hypertension -Home meds include metoprolol 100 mg twice daily, Maxzide 75/50 mg, half tab daily. -Currently heart rate and blood pressure are controlled on metoprolol 25 mg twice daily.  Maxide on hold.  Continue to monitor. -Continue aspirin and statin  Mobility: May need PT eval Code Status:   Code Status: Full Code  Nutritional having swelling: Body mass index is 33 kg/m.     Diet Order  Diet Carb Modified Fluid consistency: Thin; Room service appropriate? Yes  Diet effective now                 DVT prophylaxis: enoxaparin (LOVENOX) injection 40 mg Start: 10/07/19 1000   Antimicrobials:  IV Rocephin/IV vancomycin Fluid: Normal saline at 50 mill per hour Consultants: Orthopedics Family Communication:  Not at bedside  Status is: Inpatient  Remains inpatient appropriate because:Ongoing diagnostic testing needed not appropriate for  outpatient work up and IV treatments appropriate due to intensity of illness or inability to take PO   Dispo: The patient is from: Home              Anticipated d/c is to: Home              Anticipated d/c date is: 3 days              Patient currently is not medically stable to d/c.       Infusions:  . sodium chloride 125 mL/hr at 10/07/19 1500  . cefTRIAXone (ROCEPHIN)  IV 2 g (10/08/19 0537)  . vancomycin 1,000 mg (10/08/19 0626)    Scheduled Meds: . aspirin EC  81 mg Oral Daily  . docusate sodium  100 mg Oral Daily  . enoxaparin (LOVENOX) injection  40 mg Subcutaneous Q24H  . insulin aspart  0-15 Units Subcutaneous TID WC  . insulin aspart  4 Units Subcutaneous TID WC  . insulin glargine  50 Units Subcutaneous QHS  . levothyroxine  112 mcg Oral Q0600  . metoprolol tartrate  25 mg Oral BID  . multivitamin with minerals  1 tablet Oral Daily  . pravastatin  40 mg Oral QPM    Antimicrobials: Anti-infectives (From admission, onward)   Start     Dose/Rate Route Frequency Ordered Stop   10/07/19 0600  cefTRIAXone (ROCEPHIN) 2 g in sodium chloride 0.9 % 100 mL IVPB        2 g 200 mL/hr over 30 Minutes Intravenous Every 24 hours 10/07/19 0524     10/07/19 0600  vancomycin (VANCOCIN) IVPB 1000 mg/200 mL premix        1,000 mg 200 mL/hr over 60 Minutes Intravenous Every 12 hours 10/07/19 0541     10/07/19 0300  clindamycin (CLEOCIN) IVPB 600 mg        600 mg 100 mL/hr over 30 Minutes Intravenous  Once 10/07/19 0246 10/07/19 0411      PRN meds: acetaminophen **OR** acetaminophen, ondansetron **OR** ondansetron (ZOFRAN) IV, traMADol   Objective: Vitals:   10/08/19 0530 10/08/19 0910  BP: 122/66 122/66  Pulse: 84 89  Resp: 18   Temp: 99 F (37.2 C)   SpO2: 94%     Intake/Output Summary (Last 24 hours) at 10/08/2019 1006 Last data filed at 10/08/2019 0400 Gross per 24 hour  Intake 2525 ml  Output --  Net 2525 ml   Filed Weights   10/07/19 0003  Weight: 104.3  kg   Weight change:  Body mass index is 33 kg/m.   Physical Exam: General exam: Appears calm and comfortable.  Not in physical distress Skin: No rashes, lesions or ulcers. HEENT: Atraumatic, normocephalic, supple neck, no obvious bleeding Lungs: Clear to auscultation bilaterally CVS: Regular rhythm, tachycardic, no murmur GI/Abd soft, nontender, nondistended, bowel sound present CNS: Alert, awake, oriented x3 Psychiatry: Mood appropriate Extremities: Same as yesterday.  Grossly red and swollen left great toe with black eschar on the undersurface.  Redness extends up to the mid  leg.  There is a streak of redness in the medial aspect of thigh extending proximally.  Right leg normal  Data Review: I have personally reviewed the laboratory data and studies available.  Recent Labs  Lab 10/06/19 2349 10/08/19 0119  WBC 12.3* 10.3  NEUTROABS 10.1* 7.7  HGB 13.0 10.6*  HCT 41.0 32.9*  MCV 91.3 91.6  PLT 190 158   Recent Labs  Lab 10/06/19 2349 10/08/19 0119  NA 134* 133*  K 3.7 3.4*  CL 101 103  CO2 19* 21*  GLUCOSE 198* 125*  BUN 18 11  CREATININE 1.14* 0.85  CALCIUM 9.6 8.3*  MG  --  2.1  PHOS  --  2.4*    F/u labs ordered  Signed, Lorin Glass, MD [facility] 10/08/2019

## 2019-10-08 NOTE — Progress Notes (Signed)
Pharmacy Antibiotic Note  Christie Bryant is a 64 y.o. female admitted on 10/06/2019 with cellulitis.  Pharmacy has been consulted for Vancomycin dosing.   WBC now 10.3, Scr down from 1.14 to 0.85 (CrCl 85 mL/min). Afebrile. Vancomycin trough came back at 11. Imaging yesterday compatible with cellulitis but no radiographic evidence of OM. Notes still indicate concern for osteomyelitis - plan for Dr Lajoyce Corners to evaluate further. Bcx neg to date.   Plan: Increase vancomycin to 1250 mg IV q12h Ceftriaxone per MD Trend WBC, temp, renal function  F/U infectious work-up Drug levels as indicated   Height: 5\' 10"  (177.8 cm) Weight: 104.3 kg (230 lb) IBW/kg (Calculated) : 68.5  Temp (24hrs), Avg:99 F (37.2 C), Min:98.4 F (36.9 C), Max:99.7 F (37.6 C)  Recent Labs  Lab 10/06/19 2349 10/07/19 1236 10/08/19 0119 10/08/19 1804  WBC 12.3*  --  10.3  --   CREATININE 1.14*  --  0.85  --   LATICACIDVEN 1.8 1.1  --   --   VANCOTROUGH  --   --   --  11*    Estimated Creatinine Clearance: 85.1 mL/min (by C-G formula based on SCr of 0.85 mg/dL).    No Known Allergies  10/10/19, PharmD, BCCCP Clinical Pharmacist  Phone: [phone] 10/08/2019 7:06 PM  Please check AMION for all [facility] Pharmacy phone numbers After 10:00 PM, call Main Pharmacy [phone]

## 2019-10-09 ENCOUNTER — Inpatient Hospital Stay (HOSPITAL_COMMUNITY): Payer: 59

## 2019-10-09 ENCOUNTER — Encounter (HOSPITAL_COMMUNITY): Payer: Self-pay | Admitting: Internal Medicine

## 2019-10-09 DIAGNOSIS — L0390 Cellulitis, unspecified: Secondary | ICD-10-CM

## 2019-10-09 DIAGNOSIS — E1142 Type 2 diabetes mellitus with diabetic polyneuropathy: Secondary | ICD-10-CM

## 2019-10-09 DIAGNOSIS — L03116 Cellulitis of left lower limb: Secondary | ICD-10-CM

## 2019-10-09 DIAGNOSIS — A419 Sepsis, unspecified organism: Principal | ICD-10-CM

## 2019-10-09 LAB — CBC WITH DIFFERENTIAL/PLATELET
Abs Immature Granulocytes: 0.06 10*3/uL (ref 0.00–0.07)
Basophils Absolute: 0 10*3/uL (ref 0.0–0.1)
Basophils Relative: 0 %
Eosinophils Absolute: 0.3 10*3/uL (ref 0.0–0.5)
Eosinophils Relative: 4 %
HCT: 34.6 % — ABNORMAL LOW (ref 36.0–46.0)
Hemoglobin: 10.9 g/dL — ABNORMAL LOW (ref 12.0–15.0)
Immature Granulocytes: 1 %
Lymphocytes Relative: 18 %
Lymphs Abs: 1.3 10*3/uL (ref 0.7–4.0)
MCH: 28.5 pg (ref 26.0–34.0)
MCHC: 31.5 g/dL (ref 30.0–36.0)
MCV: 90.3 fL (ref 80.0–100.0)
Monocytes Absolute: 0.6 10*3/uL (ref 0.1–1.0)
Monocytes Relative: 9 %
Neutro Abs: 5.1 10*3/uL (ref 1.7–7.7)
Neutrophils Relative %: 68 %
Platelets: 162 10*3/uL (ref 150–400)
RBC: 3.83 MIL/uL — ABNORMAL LOW (ref 3.87–5.11)
RDW: 15.7 % — ABNORMAL HIGH (ref 11.5–15.5)
WBC: 7.5 10*3/uL (ref 4.0–10.5)
nRBC: 0 % (ref 0.0–0.2)

## 2019-10-09 LAB — BASIC METABOLIC PANEL
Anion gap: 7 (ref 5–15)
BUN: 8 mg/dL (ref 8–23)
CO2: 22 mmol/L (ref 22–32)
Calcium: 8.5 mg/dL — ABNORMAL LOW (ref 8.9–10.3)
Chloride: 108 mmol/L (ref 98–111)
Creatinine, Ser: 0.85 mg/dL (ref 0.44–1.00)
GFR calc Af Amer: >60 mL/min (ref >60)
GFR calc non Af Amer: >60 mL/min (ref >60)
Glucose, Bld: 118 mg/dL — ABNORMAL HIGH (ref 70–99)
Potassium: 3.5 mmol/L (ref 3.5–5.1)
Sodium: 137 mmol/L (ref 135–145)

## 2019-10-09 LAB — GLUCOSE, CAPILLARY
Glucose-Capillary: 115 mg/dL — ABNORMAL HIGH (ref 70–99)
Glucose-Capillary: 117 mg/dL — ABNORMAL HIGH (ref 70–99)
Glucose-Capillary: 110 mg/dL — ABNORMAL HIGH (ref 70–99)
Glucose-Capillary: 157 mg/dL — ABNORMAL HIGH (ref 70–99)

## 2019-10-09 NOTE — Consult Note (Signed)
ORTHOPAEDIC CONSULTATION  REQUESTING PHYSICIAN: Lorin Glass, MD  Chief Complaint: Cellulitis left lower extremity from the great toe no to the proximal medial left thigh  HPI: Christie Bryant is a 64 y.o. female who presents with cellulitis of the left great toe with ascending cellulitis into the calf and thigh.  Patient states that she was going to a vascular vein clinic she was undergoing compression wraps and was recommended vein ablation surgery.  Patient states she was also placed on Keflex for her recent cellulitis.  Past Medical History:  Diagnosis Date  . Chicken pox   . Diabetes mellitus 2012   Dr. Tedd Sias  . Hyperlipidemia   . Hypertension   . Kidney stone   . Recurrent boils   . Thyroid disease    Past Surgical History:  Procedure Laterality Date  . ABDOMINAL HYSTERECTOMY  1998   complete  . BIOPSY THYROID     normal, Dr. Tedd Sias  . BREAST REDUCTION SURGERY    . COLONOSCOPY WITH PROPOFOL N/A 08/13/2014   Procedure: COLONOSCOPY WITH PROPOFOL;  Surgeon: Wallace Cullens, MD;  Location: [facility] ENDOSCOPY;  Service: Gastroenterology;  Laterality: N/A;  . COLONOSCOPY WITH PROPOFOL N/A 11/12/2014   Procedure: COLONOSCOPY WITH PROPOFOL;  Surgeon: Wallace Cullens, MD;  Location: [facility] ENDOSCOPY;  Service: Gastroenterology;  Laterality: N/A;  . gluteal abscess drainage  03/17/13  . TUBAL LIGATION     Social History   Socioeconomic History  . Marital status: Married    Spouse name: Not on file  . Number of children: Not on file  . Years of education: Not on file  . Highest education level: Not on file  Occupational History  . Not on file  Tobacco Use  . Smoking status: Never Smoker  . Smokeless tobacco: Never Used  Substance and Sexual Activity  . Alcohol use: No  . Drug use: No  . Sexual activity: Not on file  Other Topics Concern  . Not on file  Social History Narrative   Lives in [place], from [place].       Work - Dispensing optician   Social Determinants of Manufacturing engineer Strain:   . Difficulty of Paying Living Expenses: Not on file  Food Insecurity:   . Worried About Programme researcher, broadcasting/film/video in the Last Year: Not on file  . Ran Out of Food in the Last Year: Not on file  Transportation Needs:   . Lack of Transportation (Medical): Not on file  . Lack of Transportation (Non-Medical): Not on file  Physical Activity:   . Days of Exercise per Week: Not on file  . Minutes of Exercise per Session: Not on file  Stress:   . Feeling of Stress : Not on file  Social Connections:   . Frequency of Communication with Friends and Family: Not on file  . Frequency of Social Gatherings with Friends and Family: Not on file  . Attends Religious Services: Not on file  . Active Member of Clubs or Organizations: Not on file  . Attends Banker Meetings: Not on file  . Marital Status: Not on file   Family History  Problem Relation Age of Onset  . Cancer Mother        ovarian/uterus  . Hyperlipidemia Father   . Heart disease Father   . Hypertension Father   . Cancer Father        throat  . Hypertension Sister   . Hypertension Brother   .  Hypertension Sister   . Hypertension Sister   . Stroke Maternal Grandmother   . Hypertension Maternal Grandmother   . Stroke Maternal Grandfather   . Hypertension Maternal Grandfather   . Hypertension Other   . Heart disease Other   . Sudden death Other   . Diabetes Other    - negative except otherwise stated in the family history section No Known Allergies Prior to Admission medications   Medication Sig Start Date End Date Taking? Authorizing Provider  aspirin 81 MG tablet Take 81 mg by mouth daily.   Yes [provider]  Cholecalciferol (VITAMIN D-3 PO) Take 2,000 Units by mouth daily.    Yes [provider]  docusate sodium (COLACE) 100 MG capsule Take 100 mg by mouth daily.    Yes [provider]  FARXIGA 10 MG TABS tablet Take 10 mg by mouth every morning. 08/09/19   Yes [provider]  levothyroxine (SYNTHROID, LEVOTHROID) 112 MCG tablet TAKE ONE TABLET BY MOUTH ONCE DAILY Patient taking differently: Take 112 mcg by mouth daily before breakfast.  01/08/14  Yes Shelia Media, MD  metFORMIN (GLUCOPHAGE) 1000 MG tablet Take 1,000 mg by mouth 2 (two) times daily with a meal.   Yes [provider]  metoprolol (LOPRESSOR) 100 MG tablet TAKE ONE TABLET BY MOUTH TWICE DAILY Patient taking differently: Take 100 mg by mouth 2 (two) times daily.  09/14/14  Yes Shelia Media, MD  Multiple Vitamin (MULTIVITAMIN) capsule Take 1 capsule by mouth daily.   Yes [provider]  pravastatin (PRAVACHOL) 40 MG tablet TAKE ONE TABLET BY MOUTH ONCE DAILY Patient taking differently: Take 40 mg by mouth every evening.  05/22/15  Yes Shelia Media, MD  TRESIBA FLEXTOUCH 200 UNIT/ML FlexTouch Pen Inject 60-100 Units into the skin at bedtime.  09/19/19  Yes [provider]  triamterene-hydrochlorothiazide (MAXZIDE) 75-50 MG per tablet TAKE ONE-HALF TABLET BY MOUTH ONCE DAILY Patient taking differently: Take 0.5 tablets by mouth daily.  03/05/14  Yes Shelia Media, MD   DG Foot Complete Left  Result Date: 10/07/2019 CLINICAL DATA:  Cellulitis of the foot. Possible wound/ulcer between the great toe and second toe. EXAM: LEFT FOOT - COMPLETE 3+ VIEW COMPARISON:  None. FINDINGS: There is no evidence of fracture or dislocation. No rows of change. No radiopaque foreign body. No definite subcutaneous gas. Mild midfoot degenerative change. Plantar calcaneal enthesophyte. Soft tissue swelling about the foot. IMPRESSION: Soft tissue swelling about the foot, compatible with reported cellulitis. No radiographic evidence of osteomyelitis. Electronically Signed   By: Feliberto Harts MD   On: 10/07/2019 11:53   - pertinent xrays, CT, MRI studies were reviewed and independently interpreted  Positive ROS: All other systems have been reviewed and were  otherwise negative with the exception of those mentioned in the HPI and as above.  Physical Exam: General: Alert, no acute distress Psychiatric: Patient is competent for consent with normal mood and affect Lymphatic: No axillary or cervical lymphadenopathy Cardiovascular: No pedal edema Respiratory: No cyanosis, no use of accessory musculature GI: No organomegaly, abdomen is soft and non-tender    Images:  @ENCIMAGES @  Labs:  Lab Results  Component Value Date   HGBA1C 7.7 (H) 10/07/2019   HGBA1C 9.4 (A) 01/22/2014   HGBA1C 8.4 (H) 11/20/2013   REPTSTATUS PENDING 10/07/2019   GRAMSTAIN No WBC Seen 09/08/2011   GRAMSTAIN Rare Squamous Epithelial Cells Present 09/08/2011   GRAMSTAIN No Organisms Seen 09/08/2011   CULT  10/07/2019  NO GROWTH 2 DAYS Performed at [facility], [address], [place], [place] [address]    LABORGA METHICILLIN RESISTANT STAPHYLOCOCCUS AUREUS 09/08/2011    Lab Results  Component Value Date   ALBUMIN 3.8 10/06/2019   ALBUMIN 3.5 11/20/2013   ALBUMIN 3.4 (L) 03/16/2013    Neurologic: Patient does not have protective sensation bilateral lower extremities.   MUSCULOSKELETAL:   Skin: Examination patient has cellulitis of the left great toe this extends into the calf and extends proximally on the medial aspect of the left thigh the area of cellulitis in the calf is tender to palpation as well as the thigh.  The cellulitis around the great toe left foot is not tender to palpation.  I cannot palpate a pulse secondary to swelling.  Patient has venous stasis swelling in the left lower extremity with pitting edema up to the tibial tubercle but there are no venous ulcers no brawny skin color changes.  Review of the radiographs shows no bony abnormalities of the left great toe.  There is no sausage digit swelling no chronic draining ulcers.  Assessment: Assessment: Cellulitis left great toe with ascending cellulitis to the medial left calf  and medial left thigh proximally.  Plan: Plan: I will order an MRI scan left foot to see if there is any acute osteomyelitis of the left great toe.  Discussed with the patient that she would need to continue IV antibiotics to address the ascending cellulitis and may require long-term antibiotics if there is osteomyelitis of the left great toe.  Thank you for the consult and the opportunity to see Christie Bryant  Aldean Baker, MD [facility] [phone] 7:13 AM

## 2019-10-09 NOTE — Progress Notes (Signed)
PROGRESS NOTE  Christie Bryant  DOB: 1955/06/01  PCP: Marisue Ivan, MD OJJ:[id number]  DOA: 10/06/2019  LOS: 2 days   Chief Complaint  Patient presents with  . Blood Infection    possible   Brief narrative: Christie Bryant is a 64 y.o. female with PMH of DM2, HTN, HLD, renal stone, hypothyroidism. Patient presented to the ED on 10/06/2019 with complaint of worsening of the left foot wound. She noticed a blister on the undersurface of her great toe few weeks ago.  She does not know if it popped open without her knowledge.  3 weeks ago, she started having swelling, redness and pain locally at the left great toe which extended ultimately to include her foot up to mid leg.  She saw vascular surgeon at [facility], had a negative DVT scan on 9/15.  She was placed on a course of Keflex after which her symptoms recur again and completed another week of Keflex only to have symptoms back again.  She reported fever of 102 at home, associated with weakness and dizziness.   For last few days, left leg pain has extended up to her thigh and groin as well.  In the ED, temperature 100.9, heart rate 115, blood pressure stable Labs with WBC count elevated to 12.3, lactic acid normal at 1.8, creatinine 1.14, blood glucose level 198. RVP including Covid PCR and influenza PCR negative Urinalysis with clear yellow urine, negative for leukocytes or nitrate.  Patient was admitted under hospital service for further evaluation management  Subjective: Patient was seen and examined this afternoon..   Lying on bed.  Not in distress.   Underwent MRI of left foot this morning which did not show acute osteomyelitis.    Assessment/Plan: Sepsis - POA Cellulitis of left foot Diabetic foot wound -Presented with fever, tachycardia, leukocytosis due to nonhealing left foot wound not responsive to outpatient antibiotics. -X-ray of left foot shows cellulitis with no bony involvement. -On admission, patient had grossly red  and swollen left great toe with black eschar on the undersurface.  Redness extended up to the mid leg.  There was a streak of redness in the medial aspect of thigh extending proximally.   -Osteomyelitis was suspected but ruled out by MRI.  Consultation obtained with Dr. Lajoyce Corners.   -Cellulitis improving but is still significant. -Continue IV Rocephin.  Stop IV vancomycin.  Stop IV fluid. -Tylenol as needed for fever  Poorly controlled DM2 -A1c 7.7 on 9/25. Hyperglycemia -Home meds include Tresiba daily, Farxiga 10 mg daily, Metformin 1000 mg twice daily, -Currently blood sugar level is controlled on Lantus 50 units nightly along with Premeal scheduled and sliding scale insulin. -Monitor Accu-Cheks. Recent Labs  Lab 10/08/19 1129 10/08/19 1616 10/08/19 2129 10/09/19 0605 10/09/19 1104  GLUCAP 124* 103* 128* 115* 117*   Essential hypertension -Home meds include metoprolol 100 mg twice daily, Maxzide 75/50 mg, half tab daily. -Currently heart rate and blood pressure are controlled on metoprolol 25 mg twice daily.  Maxide on hold.  Continue to monitor. -Continue aspirin and statin  Mobility: Encourage ambulation. Code Status:   Code Status: Full Code  Nutritional having swelling: Body mass index is 33 kg/m.     Diet Order            Diet Carb Modified Fluid consistency: Thin; Room service appropriate? Yes  Diet effective now                 DVT prophylaxis: enoxaparin (LOVENOX) injection 40 mg  Start: 10/07/19 1000   Antimicrobials:  IV Rocephin Fluid: None Consultants: Orthopedics Family Communication:  Not at bedside  Status is: Inpatient  Remains inpatient appropriate because:Ongoing diagnostic testing needed not appropriate for outpatient work up and IV treatments appropriate due to intensity of illness or inability to take PO   Dispo: The patient is from: Home              Anticipated d/c is to: Home              Anticipated d/c date is: 3 days              Patient  currently is not medically stable to d/c.       Infusions:  . sodium chloride 50 mL/hr at 10/08/19 2356  . cefTRIAXone (ROCEPHIN)  IV 2 g (10/09/19 0532)  . vancomycin 1,250 mg (10/09/19 0744)    Scheduled Meds: . aspirin EC  81 mg Oral Daily  . benzonatate  100 mg Oral BID  . docusate sodium  100 mg Oral Daily  . enoxaparin (LOVENOX) injection  40 mg Subcutaneous Q24H  . insulin aspart  0-15 Units Subcutaneous TID WC  . insulin aspart  4 Units Subcutaneous TID WC  . insulin glargine  50 Units Subcutaneous QHS  . levothyroxine  112 mcg Oral Q0600  . metoprolol tartrate  25 mg Oral BID  . multivitamin with minerals  1 tablet Oral Daily  . pravastatin  40 mg Oral QPM    Antimicrobials: Anti-infectives (From admission, onward)   Start     Dose/Rate Route Frequency Ordered Stop   10/09/19 0700  vancomycin (VANCOREADY) IVPB 1250 mg/250 mL        1,250 mg 166.7 mL/hr over 90 Minutes Intravenous Every 12 hours 10/08/19 1910     10/07/19 0600  cefTRIAXone (ROCEPHIN) 2 g in sodium chloride 0.9 % 100 mL IVPB        2 g 200 mL/hr over 30 Minutes Intravenous Every 24 hours 10/07/19 0524     10/07/19 0600  vancomycin (VANCOCIN) IVPB 1000 mg/200 mL premix  Status:  Discontinued        1,000 mg 200 mL/hr over 60 Minutes Intravenous Every 12 hours 10/07/19 0541 10/08/19 1910   10/07/19 0300  clindamycin (CLEOCIN) IVPB 600 mg        600 mg 100 mL/hr over 30 Minutes Intravenous  Once 10/07/19 0246 10/07/19 0411      PRN meds: acetaminophen **OR** acetaminophen, ondansetron **OR** ondansetron (ZOFRAN) IV, traMADol   Objective: Vitals:   10/09/19 0604 10/09/19 1227  BP: (!) 145/69 123/65  Pulse: 76 70  Resp:  18  Temp: 98.5 F (36.9 C) 98.5 F (36.9 C)  SpO2: 95% 96%    Intake/Output Summary (Last 24 hours) at 10/09/2019 1452 Last data filed at 10/08/2019 2232 Gross per 24 hour  Intake 490 ml  Output --  Net 490 ml   Filed Weights   10/07/19 0003  Weight: 104.3 kg    Weight change:  Body mass index is 33 kg/m.   Physical Exam: General exam: Appears calm and comfortable.  Not in physical distress Skin: No rashes, lesions or ulcers. HEENT: Atraumatic, normocephalic, supple neck, no obvious bleeding Lungs: Clear to auscultation bilaterally CVS: Regular rhythm, tachycardic, no murmur GI/Abd soft, nontender, nondistended, bowel sound present CNS: Alert, awake, oriented x3 Psychiatry: Mood appropriate Extremities: Cellulitis shrinking.  Data Review: I have personally reviewed the laboratory data and studies available.  Recent Labs  Lab 10/06/19 2349 10/08/19 0119 10/09/19 0356  WBC 12.3* 10.3 7.5  NEUTROABS 10.1* 7.7 5.1  HGB 13.0 10.6* 10.9*  HCT 41.0 32.9* 34.6*  MCV 91.3 91.6 90.3  PLT 190 158 162   Recent Labs  Lab 10/06/19 2349 10/08/19 0119 10/09/19 0356  NA 134* 133* 137  K 3.7 3.4* 3.5  CL 101 103 108  CO2 19* 21* 22  GLUCOSE 198* 125* 118*  BUN 18 11 8   CREATININE 1.14* 0.85 0.85  CALCIUM 9.6 8.3* 8.5*  MG  --  2.1  --   PHOS  --  2.4*  --     F/u labs ordered  Signed, , MD [facility] 10/09/2019

## 2019-10-10 LAB — GLUCOSE, CAPILLARY: Glucose-Capillary: 96 mg/dL (ref 70–99)

## 2019-10-10 MED ORDER — METOPROLOL TARTRATE 25 MG PO TABS: 25.0000 mg | ORAL_TABLET | Freq: Two times a day (BID) | ORAL | 0 refills | Status: DC

## 2019-10-10 MED ORDER — CEFDINIR 300 MG PO CAPS: 300.0000 mg | ORAL_CAPSULE | Freq: Two times a day (BID) | ORAL | 0 refills | Status: DC

## 2019-10-10 MED ORDER — SACCHAROMYCES BOULARDII 250 MG PO CAPS: 250.0000 mg | ORAL_CAPSULE | Freq: Two times a day (BID) | ORAL | 0 refills | Status: AC

## 2019-10-10 MED ORDER — ACETAMINOPHEN 325 MG PO TABS: 650.0000 mg | ORAL_TABLET | Freq: Four times a day (QID) | ORAL | Status: AC | PRN

## 2019-10-10 MED ORDER — BENZONATATE 100 MG PO CAPS: 100.0000 mg | ORAL_CAPSULE | Freq: Two times a day (BID) | ORAL | 0 refills | Status: AC | PRN

## 2019-10-10 NOTE — Discharge Summary (Signed)
Physician Discharge Summary  Cordella Nyquist Lamantia ZOX:[id number] DOB: 1955-03-30 DOA: 10/06/2019  PCP: Marisue Ivan, MD  Admit date: 10/06/2019 Discharge date: 10/10/2019  Admitted From: Home Discharge disposition: Home   Code Status: Full Code  Diet Recommendation: Cardiac diet  Discharge Diagnosis:   Principal Problem:   Cellulitis of left foot Active Problems:   Hypertension   DM type 2 with diabetic peripheral neuropathy (HCC)   Cellulitis of left lower extremity   Sepsis due to cellulitis (HCC)   History of Present Illness / Brief narrative:  Christie Bryant is a 64 y.o. female with PMH of DM2, HTN, HLD, renal stone, hypothyroidism. Patient presented to the ED on 10/06/2019 with complaint of worsening of the left foot wound. She noticed a blister on the undersurface of her great toe few weeks ago.  She does not know if it popped open without her knowledge.  3 weeks ago, she started having swelling, redness and pain locally at the left great toe which extended ultimately to include her foot up to mid leg.  She saw vascular surgeon at [facility], had a negative DVT scan on 9/15.  She was placed on a course of Keflex after which her symptoms recur again and completed another week of Keflex only to have symptoms back again.  She reported fever of 102 at home, associated with weakness and dizziness.  For last few days, left leg pain has extended up to her thigh and groin as well.  In the ED, temperature 100.9, heart rate 115, blood pressure stable Labs with WBC count elevated to 12.3, lactic acid normal at 1.8, creatinine 1.14, blood glucose level 198. RVP including Covid PCR and influenza PCR negative Urinalysis with clear yellow urine, negative for leukocytes or nitrate.  Patient was admitted under hospital service for further evaluation management  Subjective:  Seen and examined this morning.  Present elderly Caucasian female.  Feels better.  Cellulitis improving.  Pain improving.   Wants to go home.  Hospital Course:  Sepsis - POA Cellulitis of left foot Diabetic foot wound -Presented with fever, tachycardia, leukocytosis due to nonhealing left foot wound not responsive to outpatient antibiotics. -X-ray of left foot shows cellulitis with no bony involvement. -On admission, patient had grossly red and swollen left great toe with black eschar on the undersurface.  Redness extended up to the mid leg.  There was a streak of redness in the medial aspect of thigh extending proximally.   -Osteomyelitis was suspected but ruled out by MRI.  Consultation obtained with Dr. Lajoyce Corners.   -Cellulitis improving with IV Rocephin. -Discharge home on oral Omnicef for next 10 days with probiotics. -Tylenol as needed for pain.   -Follow-up with Dr. Lajoyce Corners in 1 week.  Possible underlying gout -Patient denies history of gout. -However MRI suggest possibility of gout as the underlying cause.  It is possible that her infection developed from a gout episode.  Uric acid level added to this morning lab.  Patient will have another level drawn at Dr. Audrie Lia office in a week.    Poorly controlled DM2 -A1c 7.7 on 9/25. Hyperglycemia -Home meds include Tresiba daily, Farxiga 10 mg daily, Metformin 1000 mg twice daily, -Resume home meds post discharge. Recent Labs  Lab 10/09/19 0605 10/09/19 1104 10/09/19 1615 10/09/19 2220 10/10/19 0546  GLUCAP 115* 117* 110* 157* 96   Recent Labs    10/06/19 2349 10/08/19 0119 10/09/19 0356  BUN CREATININE 1.14* 0.85 0.85   Essential  hypertension -Home meds include metoprolol 100 mg twice daily, Maxzide 75/50 mg, half tab daily. -Currently heart rate and blood pressure are controlled on metoprolol 25 mg twice daily.  Maxide on hold.  -I advised the patient to continue metoprolol 25 mg twice daily only for now.  She will monitor her heart rate and blood pressure at home and follow-up with PCP for further adjustment. -Continue aspirin and  statin.  Dry cough -Improving with benzonatate.  Stable for discharge to home today.  Wound care: Wound / Incision (Open or Dehisced) 10/07/19 Other (Comment);Non-pressure wound Left thick yellow crusted skin  (Active)  Date First Assessed/Time First Assessed: 10/07/19 1130   Wound Type: Other (Comment);Non-pressure wound  Location Orientation: (c) Left  Wound Description (Comments): thick yellow crusted skin   Present on Admission: Yes    Assessments 10/07/2019  7:02 PM 10/08/2019 11:00 PM  Dressing Changed New Changed  Dressing Status Clean;Dry;Intact Clean;Dry;Intact  Site / Wound Assessment Clean;Dry;Yellow Clean;Dry;Yellow  Peri-wound Assessment Intact;Pink --  Margins Attached edges (approximated) --  Drainage Amount None --     No Linked orders to display    Discharge Exam:   Vitals:   10/09/19 1227 10/09/19 1700 10/09/19 2222 10/10/19 0547  BP: 123/65 133/68 131/73 132/76  Pulse: 70 68 72 70  Resp: 18 18 16 16   Temp: 98.5 F (36.9 C) 98.6 F (37 C) 98.7 F (37.1 C) (!) 97.4 F (36.3 C)  TempSrc: Oral Oral Oral Oral  SpO2: 96% 97% 97% 96%  Weight:      Height:        Body mass index is 33 kg/m.  General exam: Appears calm and comfortable.  Not in physical distress Skin: No rashes, lesions or ulcers. HEENT: Atraumatic, normocephalic, supple neck, no obvious bleeding Lungs: Clear to auscultation bilaterally CVS: Regular rate and rhythm, no murmur GI/Abd soft, nontender, nondistended, bowel sound present CNS: Alert, awake monitor x3 Psychiatry: Mood appropriate Extremities: Left thigh, leg and foot cellulitis improving.  No open wound.  Right leg intact.  Follow ups:   Discharge Instructions    Increase activity slowly   Complete by: As directed    Leave dressing on - Keep it clean, dry, and intact until clinic visit   Complete by: As directed       Follow-up Information    Nadara Mustarduda, Marcus V, MD Follow up in 1 week(s).   Specialty: Orthopedic  Surgery Contact information: [address] [place] [address] [phone]        Marisue IvanLinthavong, Kanhka, MD Follow up in 1 week(s).   Specialty: Family Medicine Contact information: [address] [facility] [place] [id number] [phone]               Recommendations for Outpatient Follow-Up:   1. Follow-up with PCP as an outpatient 2. Follow-up with orthopedics as an outpatient  Discharge Instructions:  Follow with Primary MD Marisue IvanLinthavong, Kanhka, MD in 7 days   Get CBC/BMP checked in next visit within 1 week by PCP or SNF MD ( we routinely change or add medications that can affect your baseline labs and fluid status, therefore we recommend that you get the mentioned basic workup next visit with your PCP, your PCP may decide not to get them or add new tests based on their clinical decision)  On your next visit with your PCP, please Get Medicines reviewed and adjusted.  Please request your PCP  to go over all Hospital Tests and Procedure/Radiological results at  the follow up, please get all Hospital records sent to your Prim MD by signing hospital release before you go home.  Activity: As tolerated with Full fall precautions use walker/cane & assistance as needed  For Heart failure patients - Check your Weight same time everyday, if you gain over 2 pounds, or you develop in leg swelling, experience more shortness of breath or chest pain, call your Primary MD immediately. Follow Cardiac Low Salt Diet and 1.5 lit/day fluid restriction.  If you have smoked or chewed Tobacco in the last 2 yrs please stop smoking, stop any regular Alcohol  and or any Recreational drug use.  If you experience worsening of your admission symptoms, develop shortness of breath, life threatening emergency, suicidal or homicidal thoughts you must seek medical attention immediately by calling 911 or calling your MD immediately  if symptoms less severe.  You Must read complete  instructions/literature along with all the possible adverse reactions/side effects for all the Medicines you take and that have been prescribed to you. Take any new Medicines after you have completely understood and accpet all the possible adverse reactions/side effects.   Do not drive, operate heavy machinery, perform activities at heights, swimming or participation in water activities or provide baby sitting services if your were admitted for syncope or siezures until you have seen by Primary MD or a Neurologist and advised to do so again.  Do not drive when taking Pain medications.  Do not take more than prescribed Pain, Sleep and Anxiety Medications  Wear Seat belts while driving.   Please note You were cared for by a hospitalist during your hospital stay. If you have any questions about your discharge medications or the care you received while you were in the hospital after you are discharged, you can call the unit and asked to speak with the hospitalist on call if the hospitalist that took care of you is not available. Once you are discharged, your primary care physician will handle any further medical issues. Please note that NO REFILLS for any discharge medications will be authorized once you are discharged, as it is imperative that you return to your primary care physician (or establish a relationship with a primary care physician if you do not have one) for your aftercare needs so that they can reassess your need for medications and monitor your lab values.    Allergies as of 10/10/2019   No Known Allergies     Medication List    STOP taking these medications   triamterene-hydrochlorothiazide 75-50 MG tablet Commonly known as: MAXZIDE     TAKE these medications   acetaminophen 325 MG tablet Commonly known as: TYLENOL Take 2 tablets (650 mg total) by mouth every 6 (six) hours as needed for mild pain (or Fever >/= 101).   aspirin 81 MG tablet Take 81 mg by mouth daily.     benzonatate 100 MG capsule Commonly known as: TESSALON Take 1 capsule (100 mg total) by mouth 2 (two) times daily as needed for up to 10 days for cough.   cefdinir 300 MG capsule Commonly known as: OMNICEF Take 1 capsule (300 mg total) by mouth 2 (two) times daily for 10 days.   docusate sodium 100 MG capsule Commonly known as: COLACE Take 100 mg by mouth daily.   Farxiga 10 MG Tabs tablet Generic drug: dapagliflozin propanediol Take 10 mg by mouth every morning.   levothyroxine 112 MCG tablet Commonly known as: SYNTHROID TAKE ONE TABLET BY MOUTH ONCE DAILY  What changed: when to take this   metFORMIN 1000 MG tablet Commonly known as: GLUCOPHAGE Take 1,000 mg by mouth 2 (two) times daily with a meal.   metoprolol tartrate 25 MG tablet Commonly known as: LOPRESSOR Take 1 tablet (25 mg total) by mouth 2 (two) times daily. What changed:   medication strength  how much to take   multivitamin capsule Take 1 capsule by mouth daily.   pravastatin 40 MG tablet Commonly known as: PRAVACHOL TAKE ONE TABLET BY MOUTH ONCE DAILY What changed: when to take this   saccharomyces boulardii 250 MG capsule Commonly known as: FLORASTOR Take 1 capsule (250 mg total) by mouth 2 (two) times daily for 10 days.   Evaristo Bury FlexTouch 200 UNIT/ML FlexTouch Pen Generic drug: insulin degludec Inject 60-100 Units into the skin at bedtime.   VITAMIN D-3 PO Take 2,000 Units by mouth daily.            Discharge Care Instructions  (From admission, onward)         Start     Ordered   10/10/19 0000  Leave dressing on - Keep it clean, dry, and intact until clinic visit        10/10/19 0935          Time coordinating discharge: 35 minutes  The results of significant diagnostics from this hospitalization (including imaging, microbiology, ancillary and laboratory) are listed below for reference.    Procedures and Diagnostic Studies:   DG Chest 2 View  Result Date:  10/07/2019 CLINICAL DATA:  Suspected sepsis EXAM: CHEST - 2 VIEW COMPARISON:  None. FINDINGS: Streaky opacities in the bases favoring some atelectatic change. No consolidation, features of edema, pneumothorax, or effusion. The cardiomediastinal contours are unremarkable. No acute osseous or soft tissue abnormality. Multilevel degenerative changes are present in the imaged portions of the spine. IMPRESSION: Streaky opacities in the bases favoring atelectasis. No other acute cardiopulmonary abnormality. Electronically Signed   By: Kreg Shropshire M.D.   On: 10/07/2019 00:32   DG Foot Complete Left  Result Date: 10/07/2019 CLINICAL DATA:  Cellulitis of the foot. Possible wound/ulcer between the great toe and second toe. EXAM: LEFT FOOT - COMPLETE 3+ VIEW COMPARISON:  None. FINDINGS: There is no evidence of fracture or dislocation. No rows of change. No radiopaque foreign body. No definite subcutaneous gas. Mild midfoot degenerative change. Plantar calcaneal enthesophyte. Soft tissue swelling about the foot. IMPRESSION: Soft tissue swelling about the foot, compatible with reported cellulitis. No radiographic evidence of osteomyelitis. Electronically Signed   By: Feliberto Harts MD   On: 10/07/2019 11:53     Labs:   Basic Metabolic Panel: Recent Labs  Lab 10/06/19 2349 10/06/19 2349 10/08/19 0119 10/09/19 0356  NA 134*  --  133* 137  K 3.7   < > 3.4* 3.5  CL 101  --  103 108  CO2 19*  --  21* 22  GLUCOSE 198*  --  125* 118*  BUN 18  --  11 8  CREATININE 1.14*  --  0.85 0.85  CALCIUM 9.6  --  8.3* 8.5*  MG  --   --  2.1  --   PHOS  --   --  2.4*  --    < > = values in this interval not displayed.   GFR Estimated Creatinine Clearance: 85.1 mL/min (by C-G formula based on SCr of 0.85 mg/dL). Liver Function Tests: Recent Labs  Lab 10/06/19 2349  AST 27  ALT 24  ALKPHOS 62  BILITOT 1.5*  PROT 7.7  ALBUMIN 3.8   No results for input(s): LIPASE, AMYLASE in the last 168 hours. No results  for input(s): AMMONIA in the last 168 hours. Coagulation profile Recent Labs  Lab 10/06/19 2349  INR 1.0    CBC: Recent Labs  Lab 10/06/19 2349 10/08/19 0119 10/09/19 0356  WBC 12.3* 10.3 7.5  NEUTROABS 10.1* 7.7 5.1  HGB 13.0 10.6* 10.9*  HCT 41.0 32.9* 34.6*  MCV 91.3 91.6 90.3  PLT 190 158 162   Cardiac Enzymes: No results for input(s): CKTOTAL, CKMB, CKMBINDEX, TROPONINI in the last 168 hours. BNP: Invalid input(s): POCBNP CBG: Recent Labs  Lab 10/09/19 0605 10/09/19 1104 10/09/19 1615 10/09/19 2220 10/10/19 0546  GLUCAP 115* 117* 110* 157* 96   D-Dimer No results for input(s): DDIMER in the last 72 hours. Hgb A1c Recent Labs    10/07/19 1236  HGBA1C 7.7*   Lipid Profile No results for input(s): CHOL, HDL, LDLCALC, TRIG, CHOLHDL, LDLDIRECT in the last 72 hours. Thyroid function studies No results for input(s): TSH, T4TOTAL, T3FREE, THYROIDAB in the last 72 hours.  Invalid input(s): FREET3 Anemia work up No results for input(s): VITAMINB12, FOLATE, FERRITIN, TIBC, IRON, RETICCTPCT in the last 72 hours. Microbiology Recent Results (from the past 240 hour(s))  Culture, blood (Routine x 2)     Status: None (Preliminary result)   Collection Time: 10/06/19 11:54 PM   Specimen: BLOOD  Result Value Ref Range Status   Specimen Description BLOOD RIGHT HAND  Final   Special Requests   Final    BOTTLES DRAWN AEROBIC AND ANAEROBIC Blood Culture adequate volume   Culture   Final    NO GROWTH 3 DAYS Performed at [facility], [address], [place], [place] [address]    Report Status PENDING  Incomplete  Respiratory Panel by RT PCR (Flu A&B, Covid) - Nasopharyngeal Swab     Status: None   Collection Time: 10/07/19  4:36 AM   Specimen: Nasopharyngeal Swab  Result Value Ref Range Status   SARS Coronavirus 2 by RT PCR NEGATIVE NEGATIVE Final    Comment: (NOTE) SARS-CoV-2 target nucleic acids are NOT DETECTED.  The SARS-CoV-2 RNA is generally  detectable in upper respiratoy specimens during the acute phase of infection. The lowest concentration of SARS-CoV-2 viral copies this assay can detect is 131 copies/mL. A negative result does not preclude SARS-Cov-2 infection and should not be used as the sole basis for treatment or other patient management decisions. A negative result may occur with  improper specimen collection/handling, submission of specimen other than nasopharyngeal swab, presence of viral mutation(s) within the areas targeted by this assay, and inadequate number of viral copies (<131 copies/mL). A negative result must be combined with clinical observations, patient history, and epidemiological information. The expected result is Negative.  Fact Sheet for Patients:  https://www.moore.com/  Fact Sheet for Healthcare Providers:  https://www.young.biz/  This test is no t yet approved or cleared by the [place] FDA and  has been authorized for detection and/or diagnosis of SARS-CoV-2 by FDA under an Emergency Use Authorization (EUA). This EUA will remain  in effect (meaning this test can be used) for the duration of the COVID-19 declaration under Section 564(b)(1) of the Act, 21 U.S.C. section 360bbb-3(b)(1), unless the authorization is terminated or revoked sooner.     Influenza A by PCR NEGATIVE NEGATIVE Final   Influenza B by PCR NEGATIVE NEGATIVE Final    Comment: (NOTE) The  Xpert Xpress SARS-CoV-2/FLU/RSV assay is intended as an aid in  the diagnosis of influenza from Nasopharyngeal swab specimens and  should not be used as a sole basis for treatment. Nasal washings and  aspirates are unacceptable for Xpert Xpress SARS-CoV-2/FLU/RSV  testing.  Fact Sheet for Patients: https://www.moore.com/  Fact Sheet for Healthcare Providers: https://www.young.biz/  This test is not yet approved or cleared by the [place] FDA and   has been authorized for detection and/or diagnosis of SARS-CoV-2 by  FDA under an Emergency Use Authorization (EUA). This EUA will remain  in effect (meaning this test can be used) for the duration of the  Covid-19 declaration under Section 564(b)(1) of the Act, 21  U.S.C. section 360bbb-3(b)(1), unless the authorization is  terminated or revoked. Performed at [facility], [address], [place], [place] [address]   Culture, blood (Routine x 2)     Status: None (Preliminary result)   Collection Time: 10/07/19 12:37 PM   Specimen: BLOOD  Result Value Ref Range Status   Specimen Description BLOOD LEFT ANTECUBITAL  Final   Special Requests   Final    BOTTLES DRAWN AEROBIC AND ANAEROBIC Blood Culture adequate volume   Culture   Final    NO GROWTH 3 DAYS Performed at [facility], [address], [place], [place] [address]    Report Status PENDING  Incomplete     Signed: Melina Schools Ruberta Holck  [facility] 10/10/2019, 9:35 AM

## 2019-10-10 NOTE — Progress Notes (Signed)
Patient ID: Christie Bryant, female   DOB: [birth year], 64 y.o.   MRN: [id number] Patient is seen in follow-up for ascending cellulitis left lower extremity.  The cellulitis has significantly resolved the pain has also significantly resolved with her IV antibiotics.  MRI scan is reviewed which shows no osteomyelitis there is a suggestion of gout and I will order a uric acid.  The thigh is soft nontender the cellulitis in the thigh is resolved she still has venous stasis swelling and redness in the calf however the tenderness and ascending cellulitis is also resolved.  Anticipate patient can be discharged to home on oral antibiotics I will follow-up in the office in 1 week and will repeat uric acid levels.  It is possible that her infection developed from a gout episode.  Patient denies any previous history is of gout.

## 2019-10-12 LAB — CULTURE, BLOOD (ROUTINE X 2)
Culture: NO GROWTH
Culture: NO GROWTH
Special Requests: ADEQUATE
Special Requests: ADEQUATE

## 2019-10-17 ENCOUNTER — Encounter: Payer: Self-pay | Admitting: Orthopedic Surgery

## 2019-10-17 ENCOUNTER — Ambulatory Visit (INDEPENDENT_AMBULATORY_CARE_PROVIDER_SITE_OTHER): Payer: 59 | Admitting: Orthopedic Surgery

## 2019-10-17 VITALS — Ht 70.0 in | Wt 230.0 lb

## 2019-10-17 DIAGNOSIS — M79605 Pain in left leg: Secondary | ICD-10-CM

## 2019-10-17 DIAGNOSIS — I872 Venous insufficiency (chronic) (peripheral): Secondary | ICD-10-CM

## 2019-10-17 NOTE — Progress Notes (Signed)
Office Visit Note   Patient: Christie Bryant           Date of Birth: 12-17-1955           MRN: [id number] Visit Date: 10/17/2019              Requested by: Marisue Ivan, MD [phone] [facility] [address] [facility] [place],  [place] [address] PCP: Marisue Ivan, MD  Chief Complaint  Patient presents with  . Left Leg - Follow-up    hospital for cellulitis       HPI: Patient is a 64 year old woman who was seen for initial evaluation in the office after evaluation in the hospital she did have cellulitis with red streaking all the way up her thigh on the left lower extremity.  Patient was treated with IV antibiotics and is currently on Omnicef 300 mg twice a day.  Patient states that the cellulitis of her thigh is resolved she states she still has some induration and redness in the medial aspect of the anterior left calf.  Assessment & Plan: Visit Diagnoses:  1. Pain in left leg   2. Venous stasis dermatitis of left lower extremity     Plan: Uric acid was drawn today patient is given a sample of an extra extra-large socks she will wear this around-the-clock reevaluate in 1 week.  Patient has been seen by vascular surgery in [place] and she states she will hold on her laser vein ablation for now.  Follow-Up Instructions: Return in about 1 week (around 10/24/2019).   Ortho Exam  Patient is alert, oriented, no adenopathy, well-dressed, normal affect, normal respiratory effort. Examination the thigh is soft nontender the red streaking has resolved.  Patient still has a little bit of induration the medial aspect of the left calf.  She has no pain with range of motion of the ankle no evidence of a DVT.  There is no brawny skin color changes.  Imaging: No results found. No images are attached to the encounter.  Labs: Lab Results  Component Value Date   HGBA1C 7.7 (H) 10/07/2019   HGBA1C 9.4 (A) 01/22/2014   HGBA1C 8.4 (H) 11/20/2013   REPTSTATUS 10/12/2019 FINAL  10/07/2019   GRAMSTAIN No WBC Seen 09/08/2011   GRAMSTAIN Rare Squamous Epithelial Cells Present 09/08/2011   GRAMSTAIN No Organisms Seen 09/08/2011   CULT  10/07/2019    NO GROWTH 5 DAYS Performed at [facility], [address] [place], [place] [address]    LABORGA METHICILLIN RESISTANT STAPHYLOCOCCUS AUREUS 09/08/2011     Lab Results  Component Value Date   ALBUMIN 3.8 10/06/2019   ALBUMIN 3.5 11/20/2013   ALBUMIN 3.4 (L) 03/16/2013    Lab Results  Component Value Date   MG 2.1 10/08/2019   No results found for: VD25OH  No results found for: PREALBUMIN CBC EXTENDED Latest Ref Rng & Units 10/09/2019 10/08/2019 10/06/2019  WBC 4.0 - 10.5 K/uL 7.5 10.3 12.3(H)  RBC 3.87 - 5.11 MIL/uL 3.83(L) 3.59(L) 4.49  HGB 12.0 - 15.0 g/dL 10.9(L) 10.6(L) 13.0  HCT 36 - 46 % 34.6(L) 32.9(L) 41.0  PLT 150 - 400 K/uL 162 158 190  NEUTROABS 1.7 - 7.7 K/uL 5.1 7.7 10.1(H)  LYMPHSABS 0.7 - 4.0 K/uL 1.3 1.5 1.0     Body mass index is 33 kg/m.  Orders:  Orders Placed This Encounter  Procedures  . Uric acid   No orders of the defined types were placed in this encounter.  Procedures: No procedures performed  Clinical Data: No additional findings.  ROS:  All other systems negative, except as noted in the HPI. Review of Systems  Objective: Vital Signs: Ht 5\' 10"  (1.778 m)   Wt 230 lb (104.3 kg)   BMI 33.00 kg/m   Specialty Comments:  No specialty comments available.  PMFS History: Patient Active Problem List   Diagnosis Date Noted  . Cellulitis of left foot 10/07/2019  . Sepsis due to cellulitis (HCC) 10/07/2019  . Edema 09/05/2019  . Cellulitis of left lower extremity 09/05/2019  . Varicose veins with inflammation 09/05/2019  . DM type 2 with diabetic peripheral neuropathy (HCC) 08/03/2018  . Back pain 07/07/2014  . Cellulitis and abscess of face 03/13/2014  . Dermatitis 05/08/2013  . Routine general medical examination at a health care facility  12/05/2012  . Shingles outbreak 12/28/2011  . MRSA infection 09/16/2011  . Screening for breast cancer 09/08/2011  . Diabetes mellitus type 2, uncontrolled (HCC) 09/08/2011  . Hypertension 09/08/2011  . Hypothyroidism 09/08/2011  . Hyperlipidemia with target low density lipoprotein (LDL) cholesterol less than 70 mg/dL 09/10/2011  . Neuropathy in diabetes (HCC) 09/08/2011   Past Medical History:  Diagnosis Date  . Chicken pox   . Chronic kidney disease    Stones  . Diabetes mellitus 2012   Dr. 2013  . Hyperlipidemia   . Hypertension   . Recurrent boils   . Thyroid disease     Family History  Problem Relation Age of Onset  . Cancer Mother        ovarian/uterus  . Hyperlipidemia Father   . Heart disease Father   . Hypertension Father   . Cancer Father        throat  . Hypertension Sister   . Hypertension Brother   . Hypertension Sister   . Hypertension Sister   . Stroke Maternal Grandmother   . Hypertension Maternal Grandmother   . Stroke Maternal Grandfather   . Hypertension Maternal Grandfather   . Hypertension Other   . Heart disease Other   . Sudden death Other   . Diabetes Other     Past Surgical History:  Procedure Laterality Date  . ABDOMINAL HYSTERECTOMY  1998   complete  . BIOPSY THYROID     normal, Dr. Tedd Sias  . BREAST REDUCTION SURGERY    . COLONOSCOPY WITH PROPOFOL N/A 08/13/2014   Procedure: COLONOSCOPY WITH PROPOFOL;  Surgeon: 10/13/2014, MD;  Location: [facility] ENDOSCOPY;  Service: Gastroenterology;  Laterality: N/A;  . COLONOSCOPY WITH PROPOFOL N/A 11/12/2014   Procedure: COLONOSCOPY WITH PROPOFOL;  Surgeon: 11/14/2014, MD;  Location: [facility] ENDOSCOPY;  Service: Gastroenterology;  Laterality: N/A;  . gluteal abscess drainage  03/17/13  . TUBAL LIGATION     Social History   Occupational History  . Not on file  Tobacco Use  . Smoking status: Never Smoker  . Smokeless tobacco: Never Used  Substance and Sexual Activity  . Alcohol use: No  . Drug use: No   . Sexual activity: Not on file

## 2019-10-18 ENCOUNTER — Other Ambulatory Visit: Payer: Self-pay | Admitting: Physician Assistant

## 2019-10-18 LAB — URIC ACID: Uric Acid, Serum: 7.6 mg/dL — ABNORMAL HIGH (ref 2.5–7.0)

## 2019-10-18 MED ORDER — COLCHICINE 0.6 MG PO CAPS: 0.6000 mg | ORAL_CAPSULE | Freq: Two times a day (BID) | ORAL | 1 refills | Status: DC | PRN

## 2019-10-18 MED ORDER — CEFDINIR 300 MG PO CAPS: 300.0000 mg | ORAL_CAPSULE | Freq: Two times a day (BID) | ORAL | 0 refills | Status: AC

## 2019-10-18 MED ORDER — ALLOPURINOL 100 MG PO TABS: 100.0000 mg | ORAL_TABLET | Freq: Two times a day (BID) | ORAL | 3 refills | Status: DC

## 2019-10-20 DIAGNOSIS — R058 Other specified cough: Secondary | ICD-10-CM | POA: Diagnosis not present

## 2019-10-20 DIAGNOSIS — Z872 Personal history of diseases of the skin and subcutaneous tissue: Secondary | ICD-10-CM | POA: Diagnosis not present

## 2019-10-24 ENCOUNTER — Ambulatory Visit (INDEPENDENT_AMBULATORY_CARE_PROVIDER_SITE_OTHER): Payer: 59 | Admitting: Physician Assistant

## 2019-10-24 ENCOUNTER — Encounter: Payer: Self-pay | Admitting: Orthopedic Surgery

## 2019-10-24 DIAGNOSIS — L03116 Cellulitis of left lower limb: Secondary | ICD-10-CM

## 2019-10-24 NOTE — Progress Notes (Signed)
Office Visit Note   Patient: Christie Bryant           Date of Birth: 25-Mar-1955           MRN: [id number] Visit Date: 10/24/2019              Requested by: Marisue Ivan, MD [phone] [facility] [address] [facility] [place],  [place] [address] PCP: Marisue Ivan, MD  Chief Complaint  Patient presents with  . Left Leg - Follow-up      HPI: This is a pleasant 64 year old woman who comes up follow-up for her left leg cellulitis.  She states she is doing much better.  She still on antibiotics for another week.  She only has one very small erythematous area which is significantly decreased  Assessment & Plan: Visit Diagnoses: No diagnosis found.  Plan: Patient will finish antibiotics and continue with her compression stocking.  We did review her uric acid which was 7.6.  When she comes back in 3 weeks and uric acid should be drawn  Follow-Up Instructions: No follow-ups on file.   Ortho Exam  Patient is alert, oriented, no adenopathy, well-dressed, normal affect, normal respiratory effort. Focused examination demonstrates no ascending cellulitis.  She just have some mild erythema in the medial side of her calf.  She does have moderate soft tissue swelling but compartments are compressible distal foot swelling has decreased from her previous visit.  No signs of infection or cellulitis recurrent today.  Imaging: No results found. No images are attached to the encounter.  Labs: Lab Results  Component Value Date   HGBA1C 7.7 (H) 10/07/2019   HGBA1C 9.4 (A) 01/22/2014   HGBA1C 8.4 (H) 11/20/2013   LABURIC 7.6 (H) 10/17/2019   REPTSTATUS 10/12/2019 FINAL 10/07/2019   GRAMSTAIN No WBC Seen 09/08/2011   GRAMSTAIN Rare Squamous Epithelial Cells Present 09/08/2011   GRAMSTAIN No Organisms Seen 09/08/2011   CULT  10/07/2019    NO GROWTH 5 DAYS Performed at [facility], [address] [place], [place] [address]    LABORGA METHICILLIN RESISTANT STAPHYLOCOCCUS AUREUS  09/08/2011     Lab Results  Component Value Date   ALBUMIN 3.8 10/06/2019   ALBUMIN 3.5 11/20/2013   ALBUMIN 3.4 (L) 03/16/2013   LABURIC 7.6 (H) 10/17/2019    Lab Results  Component Value Date   MG 2.1 10/08/2019   No results found for: VD25OH  No results found for: PREALBUMIN CBC EXTENDED Latest Ref Rng & Units 10/09/2019 10/08/2019 10/06/2019  WBC 4.0 - 10.5 K/uL 7.5 10.3 12.3(H)  RBC 3.87 - 5.11 MIL/uL 3.83(L) 3.59(L) 4.49  HGB 12.0 - 15.0 g/dL 10.9(L) 10.6(L) 13.0  HCT 36 - 46 % 34.6(L) 32.9(L) 41.0  PLT 150 - 400 K/uL 162 158 190  NEUTROABS 1.7 - 7.7 K/uL 5.1 7.7 10.1(H)  LYMPHSABS 0.7 - 4.0 K/uL 1.3 1.5 1.0     There is no height or weight on file to calculate BMI.  Orders:  No orders of the defined types were placed in this encounter.  No orders of the defined types were placed in this encounter.    Procedures: No procedures performed  Clinical Data: No additional findings.  ROS:  All other systems negative, except as noted in the HPI. Review of Systems  Objective: Vital Signs: There were no vitals taken for this visit.  Specialty Comments:  No specialty comments available.  PMFS History: Patient Active Problem List   Diagnosis Date Noted  . Cellulitis of  left foot 10/07/2019  . Sepsis due to cellulitis (HCC) 10/07/2019  . Edema 09/05/2019  . Cellulitis of left lower extremity 09/05/2019  . Varicose veins with inflammation 09/05/2019  . DM type 2 with diabetic peripheral neuropathy (HCC) 08/03/2018  . Back pain 07/07/2014  . Cellulitis and abscess of face 03/13/2014  . Dermatitis 05/08/2013  . Routine general medical examination at a health care facility 12/05/2012  . Shingles outbreak 12/28/2011  . MRSA infection 09/16/2011  . Screening for breast cancer 09/08/2011  . Diabetes mellitus type 2, uncontrolled (HCC) 09/08/2011  . Hypertension 09/08/2011  . Hypothyroidism 09/08/2011  . Hyperlipidemia with target low density lipoprotein (LDL)  cholesterol less than 70 mg/dL 70/96/2836  . Neuropathy in diabetes (HCC) 09/08/2011   Past Medical History:  Diagnosis Date  . Chicken pox   . Chronic kidney disease    Stones  . Diabetes mellitus 2012   Dr. Tedd Sias  . Hyperlipidemia   . Hypertension   . Recurrent boils   . Thyroid disease     Family History  Problem Relation Age of Onset  . Cancer Mother        ovarian/uterus  . Hyperlipidemia Father   . Heart disease Father   . Hypertension Father   . Cancer Father        throat  . Hypertension Sister   . Hypertension Brother   . Hypertension Sister   . Hypertension Sister   . Stroke Maternal Grandmother   . Hypertension Maternal Grandmother   . Stroke Maternal Grandfather   . Hypertension Maternal Grandfather   . Hypertension Other   . Heart disease Other   . Sudden death Other   . Diabetes Other     Past Surgical History:  Procedure Laterality Date  . ABDOMINAL HYSTERECTOMY  1998   complete  . BIOPSY THYROID     normal, Dr. Tedd Sias  . BREAST REDUCTION SURGERY    . COLONOSCOPY WITH PROPOFOL N/A 08/13/2014   Procedure: COLONOSCOPY WITH PROPOFOL;  Surgeon: Wallace Cullens, MD;  Location: [facility] ENDOSCOPY;  Service: Gastroenterology;  Laterality: N/A;  . COLONOSCOPY WITH PROPOFOL N/A 11/12/2014   Procedure: COLONOSCOPY WITH PROPOFOL;  Surgeon: Wallace Cullens, MD;  Location: [facility] ENDOSCOPY;  Service: Gastroenterology;  Laterality: N/A;  . gluteal abscess drainage  03/17/13  . TUBAL LIGATION     Social History   Occupational History  . Not on file  Tobacco Use  . Smoking status: Never Smoker  . Smokeless tobacco: Never Used  Substance and Sexual Activity  . Alcohol use: No  . Drug use: No  . Sexual activity: Not on file

## 2019-10-25 ENCOUNTER — Ambulatory Visit: Payer: 59 | Admitting: Internal Medicine

## 2019-10-29 ENCOUNTER — Other Ambulatory Visit: Payer: Self-pay | Admitting: Physician Assistant

## 2019-10-30 ENCOUNTER — Encounter: Payer: Self-pay | Admitting: Orthopedic Surgery

## 2019-10-30 ENCOUNTER — Other Ambulatory Visit: Payer: Self-pay | Admitting: Physician Assistant

## 2019-10-30 ENCOUNTER — Telehealth: Payer: Self-pay

## 2019-10-30 NOTE — Telephone Encounter (Signed)
Patient called she is requesting prescription refill for cefdinir.call back:[phone]

## 2019-10-30 NOTE — Telephone Encounter (Signed)
I called and sw pt and she states that she is retiring this Friday and can not miss anytime at work and then well be in [place] for a week. She has a return appt on 11/14/19 and states that she is having alittle redness at the GT and that she would feel better continuing the ABX while she is unable to come in the office to get her until her appt on 11/14/19 advised I will forward this message and call tomorrow.

## 2019-10-30 NOTE — Telephone Encounter (Signed)
She has finished the antibiotics. Doesn't need to refill unless redness returns and if so should come into office

## 2019-10-30 NOTE — Telephone Encounter (Signed)
Pt is requesting refill on cefdinir last given 10/18/19 300 mg bid x 10 days for cellulitis please advise.

## 2019-10-31 ENCOUNTER — Other Ambulatory Visit: Payer: Self-pay | Admitting: Physician Assistant

## 2019-11-14 ENCOUNTER — Ambulatory Visit: Payer: 59 | Admitting: Orthopedic Surgery

## 2019-12-12 ENCOUNTER — Encounter: Payer: Self-pay | Admitting: Orthopedic Surgery

## 2019-12-12 ENCOUNTER — Ambulatory Visit (INDEPENDENT_AMBULATORY_CARE_PROVIDER_SITE_OTHER): Payer: PPO | Admitting: Physician Assistant

## 2019-12-12 DIAGNOSIS — I872 Venous insufficiency (chronic) (peripheral): Secondary | ICD-10-CM

## 2019-12-12 DIAGNOSIS — L03116 Cellulitis of left lower limb: Secondary | ICD-10-CM | POA: Diagnosis not present

## 2019-12-12 MED ORDER — ALLOPURINOL 100 MG PO TABS: 100.0000 mg | ORAL_TABLET | Freq: Two times a day (BID) | ORAL | 3 refills | Status: DC

## 2019-12-12 NOTE — Progress Notes (Signed)
Office Visit Note   Patient: Christie Bryant           Date of Birth: 27-Sep-1955           MRN: [id number] Visit Date: 12/12/2019              Requested by: Marisue Ivan, MD [phone] [facility] [address] [facility] [place],  [place] [address] PCP: Marisue Ivan, MD  Chief Complaint  Patient presents with  . Left Leg - Follow-up      HPI: This is a pleasant 64 year old woman who we have been following for left leg cellulitis.  Also been following her for gout.  Last uric acid was 7.6.  Overall she says she is doing better.  She did not take her allopurinol because not enough was called in for her.  She denies denies any gouty symptoms.  She feels her cellulitis is improving she has been wearing a compression sock.  She only is asking about a small erythematous area on the top of her foot.  Its been there for a while has not change she is thinking may be it was a boil  Assessment & Plan: Visit Diagnoses:  1. Cellulitis of left foot   2. Venous stasis dermatitis of left lower extremity     Plan: We will contact her with the results of her uric acid.  We have called in her allopurinol.  She is to be taking this.  We will follow-up for recheck of her foot and lower leg in about 3 to 4 weeks  Follow-Up Instructions: No follow-ups on file.   Ortho Exam  Patient is alert, oriented, no adenopathy, well-dressed, normal affect, normal respiratory effort. Left lower extremity: Compartments are soft and compressible she has no erythema no open areas to the left lower leg or foot.  She has a small spot raised patch that is erythematous on the dorsum of her foot.  Again there is no cellulitis could be consistent with an insect bite.  Is nontender to palpation.  Rest of her foot looks good without any cellulitis erythema or swelling  Imaging: No results found. No images are attached to the encounter.  Labs: Lab Results  Component Value Date   HGBA1C 7.7 (H) 10/07/2019   HGBA1C  9.4 (A) 01/22/2014   HGBA1C 8.4 (H) 11/20/2013   LABURIC 7.6 (H) 10/17/2019   REPTSTATUS 10/12/2019 FINAL 10/07/2019   GRAMSTAIN No WBC Seen 09/08/2011   GRAMSTAIN Rare Squamous Epithelial Cells Present 09/08/2011   GRAMSTAIN No Organisms Seen 09/08/2011   CULT  10/07/2019    NO GROWTH 5 DAYS Performed at [facility], [address], [place], [place] [address]    LABORGA METHICILLIN RESISTANT STAPHYLOCOCCUS AUREUS 09/08/2011     Lab Results  Component Value Date   ALBUMIN 3.8 10/06/2019   ALBUMIN 3.5 11/20/2013   ALBUMIN 3.4 (L) 03/16/2013   LABURIC 7.6 (H) 10/17/2019    Lab Results  Component Value Date   MG 2.1 10/08/2019   No results found for: VD25OH  No results found for: PREALBUMIN CBC EXTENDED Latest Ref Rng & Units 10/09/2019 10/08/2019 10/06/2019  WBC 4.0 - 10.5 K/uL 7.5 10.3 12.3(H)  RBC 3.87 - 5.11 MIL/uL 3.83(L) 3.59(L) 4.49  HGB 12.0 - 15.0 g/dL 10.9(L) 10.6(L) 13.0  HCT 36 - 46 % 34.6(L) 32.9(L) 41.0  PLT 150 - 400 K/uL 162 158 190  NEUTROABS 1.7 - 7.7 K/uL 5.1 7.7 10.1(H)  LYMPHSABS 0.7 - 4.0 K/uL 1.3  1.5 1.0     There is no height or weight on file to calculate BMI.  Orders:  Orders Placed This Encounter  Procedures  . Uric acid   Meds ordered this encounter  Medications  . allopurinol (ZYLOPRIM) 100 MG tablet    Sig: Take 1 tablet (100 mg total) by mouth 2 (two) times daily.    Dispense:  60 tablet    Refill:  3     Procedures: No procedures performed  Clinical Data: No additional findings.  ROS:  All other systems negative, except as noted in the HPI. Review of Systems  Objective: Vital Signs: There were no vitals taken for this visit.  Specialty Comments:  No specialty comments available.  PMFS History: Patient Active Problem List   Diagnosis Date Noted  . Cellulitis of left foot 10/07/2019  . Sepsis due to cellulitis (HCC) 10/07/2019  . Edema 09/05/2019  . Cellulitis of left lower extremity 09/05/2019  . Varicose  veins with inflammation 09/05/2019  . DM type 2 with diabetic peripheral neuropathy (HCC) 08/03/2018  . Back pain 07/07/2014  . Cellulitis and abscess of face 03/13/2014  . Dermatitis 05/08/2013  . Routine general medical examination at a health care facility 12/05/2012  . Shingles outbreak 12/28/2011  . MRSA infection 09/16/2011  . Screening for breast cancer 09/08/2011  . Diabetes mellitus type 2, uncontrolled (HCC) 09/08/2011  . Hypertension 09/08/2011  . Hypothyroidism 09/08/2011  . Hyperlipidemia with target low density lipoprotein (LDL) cholesterol less than 70 mg/dL 27/78/2423  . Neuropathy in diabetes (HCC) 09/08/2011   Past Medical History:  Diagnosis Date  . Chicken pox   . Chronic kidney disease    Stones  . Diabetes mellitus 2012   Dr. Tedd Sias  . Hyperlipidemia   . Hypertension   . Recurrent boils   . Thyroid disease     Family History  Problem Relation Age of Onset  . Cancer Mother        ovarian/uterus  . Hyperlipidemia Father   . Heart disease Father   . Hypertension Father   . Cancer Father        throat  . Hypertension Sister   . Hypertension Brother   . Hypertension Sister   . Hypertension Sister   . Stroke Maternal Grandmother   . Hypertension Maternal Grandmother   . Stroke Maternal Grandfather   . Hypertension Maternal Grandfather   . Hypertension Other   . Heart disease Other   . Sudden death Other   . Diabetes Other     Past Surgical History:  Procedure Laterality Date  . ABDOMINAL HYSTERECTOMY  1998   complete  . BIOPSY THYROID     normal, Dr. Tedd Sias  . BREAST REDUCTION SURGERY    . COLONOSCOPY WITH PROPOFOL N/A 08/13/2014   Procedure: COLONOSCOPY WITH PROPOFOL;  Surgeon: Wallace Cullens, MD;  Location: [facility] ENDOSCOPY;  Service: Gastroenterology;  Laterality: N/A;  . COLONOSCOPY WITH PROPOFOL N/A 11/12/2014   Procedure: COLONOSCOPY WITH PROPOFOL;  Surgeon: Wallace Cullens, MD;  Location: [facility] ENDOSCOPY;  Service: Gastroenterology;  Laterality: N/A;    . gluteal abscess drainage  03/17/13  . TUBAL LIGATION     Social History   Occupational History  . Not on file  Tobacco Use  . Smoking status: Never Smoker  . Smokeless tobacco: Never Used  Substance and Sexual Activity  . Alcohol use: No  . Drug use: No  . Sexual activity: Not on file

## 2019-12-13 DIAGNOSIS — N183 Chronic kidney disease, stage 3 unspecified: Secondary | ICD-10-CM | POA: Diagnosis not present

## 2019-12-13 DIAGNOSIS — E1122 Type 2 diabetes mellitus with diabetic chronic kidney disease: Secondary | ICD-10-CM | POA: Diagnosis not present

## 2019-12-13 DIAGNOSIS — E039 Hypothyroidism, unspecified: Secondary | ICD-10-CM | POA: Diagnosis not present

## 2019-12-13 DIAGNOSIS — E1165 Type 2 diabetes mellitus with hyperglycemia: Secondary | ICD-10-CM | POA: Diagnosis not present

## 2019-12-13 DIAGNOSIS — I10 Essential (primary) hypertension: Secondary | ICD-10-CM | POA: Diagnosis not present

## 2019-12-13 DIAGNOSIS — Z794 Long term (current) use of insulin: Secondary | ICD-10-CM | POA: Diagnosis not present

## 2019-12-13 LAB — URIC ACID: Uric Acid, Serum: 7.1 mg/dL — ABNORMAL HIGH (ref 2.5–7.0)

## 2020-01-09 ENCOUNTER — Encounter: Payer: Self-pay | Admitting: Physician Assistant

## 2020-01-09 ENCOUNTER — Ambulatory Visit: Payer: PPO | Admitting: Physician Assistant

## 2020-01-09 VITALS — Ht 70.0 in | Wt 230.0 lb

## 2020-01-09 DIAGNOSIS — L03116 Cellulitis of left lower limb: Secondary | ICD-10-CM

## 2020-01-09 NOTE — Progress Notes (Signed)
Office Visit Note   Patient: Christie Bryant           Date of Birth: 09/08/1955           MRN: [id number] Visit Date: 01/09/2020              Requested by: Marisue Ivan, MD [phone] [facility] [address] [facility] [place],  [place] [address] PCP: Marisue Ivan, MD  Chief Complaint  Patient presents with  . Left Leg - Follow-up      HPI  This is a pleasant 64 year old woman who follows up for her left lower leg swelling.  She also had a blister on the bottom of her great toe.  Assessment & Plan: Visit Diagnoses: No diagnosis found.  Plan: Continue with compression stocking.  She will follow-up in 1 month at which time we will draw a uric acid.  She has any problems sooner she is to call us we talked about doing some walking for short distances and elevating her legs to improve her circulation with her compression stockings in place  Follow-Up Instructions: No follow-ups on file.   Ortho Exam  Patient is alert, oriented, no adenopathy, well-dressed, normal affect, normal respiratory effort. Left lower extremity does have varicosities.  She has no cellulitis no erythema.  She does have a small area of eschar on the plantar surface of her great toe from a resolving blister.  Some of the scab was debrided but otherwise no cellulitis mild soft tissue swelling.  Imaging: No results found. No images are attached to the encounter.  Labs: Lab Results  Component Value Date   HGBA1C 7.7 (H) 10/07/2019   HGBA1C 9.4 (A) 01/22/2014   HGBA1C 8.4 (H) 11/20/2013   LABURIC 7.1 (H) 12/12/2019   LABURIC 7.6 (H) 10/17/2019   REPTSTATUS 10/12/2019 FINAL 10/07/2019   GRAMSTAIN No WBC Seen 09/08/2011   GRAMSTAIN Rare Squamous Epithelial Cells Present 09/08/2011   GRAMSTAIN No Organisms Seen 09/08/2011   CULT  10/07/2019    NO GROWTH 5 DAYS Performed at [facility], [address] [place], [place] [address]    LABORGA METHICILLIN RESISTANT STAPHYLOCOCCUS AUREUS  09/08/2011     Lab Results  Component Value Date   ALBUMIN 3.8 10/06/2019   ALBUMIN 3.5 11/20/2013   ALBUMIN 3.4 (L) 03/16/2013   LABURIC 7.1 (H) 12/12/2019   LABURIC 7.6 (H) 10/17/2019    Lab Results  Component Value Date   MG 2.1 10/08/2019   No results found for: VD25OH  No results found for: PREALBUMIN CBC EXTENDED Latest Ref Rng & Units 10/09/2019 10/08/2019 10/06/2019  WBC 4.0 - 10.5 K/uL 7.5 10.3 12.3(H)  RBC 3.87 - 5.11 MIL/uL 3.83(L) 3.59(L) 4.49  HGB 12.0 - 15.0 g/dL 10.9(L) 10.6(L) 13.0  HCT 36.0 - 46.0 % 34.6(L) 32.9(L) 41.0  PLT 150 - 400 K/uL 162 158 190  NEUTROABS 1.7 - 7.7 K/uL 5.1 7.7 10.1(H)  LYMPHSABS 0.7 - 4.0 K/uL 1.3 1.5 1.0     Body mass index is 33 kg/m.  Orders:  No orders of the defined types were placed in this encounter.  No orders of the defined types were placed in this encounter.    Procedures: No procedures performed  Clinical Data: No additional findings.  ROS:  All other systems negative, except as noted in the HPI. Review of Systems  Objective: Vital Signs: Ht 5\' 10"  (1.778 m)   Wt 230 lb (104.3 kg)   BMI 33.00 kg/m   Specialty Comments:  No specialty comments available.  PMFS History: Patient Active Problem List   Diagnosis Date Noted  . Cellulitis of left foot 10/07/2019  . Sepsis due to cellulitis (HCC) 10/07/2019  . Edema 09/05/2019  . Cellulitis of left lower extremity 09/05/2019  . Varicose veins with inflammation 09/05/2019  . DM type 2 with diabetic peripheral neuropathy (HCC) 08/03/2018  . Back pain 07/07/2014  . Cellulitis and abscess of face 03/13/2014  . Dermatitis 05/08/2013  . Routine general medical examination at a health care facility 12/05/2012  . Shingles outbreak 12/28/2011  . MRSA infection 09/16/2011  . Screening for breast cancer 09/08/2011  . Diabetes mellitus type 2, uncontrolled (HCC) 09/08/2011  . Hypertension 09/08/2011  . Hypothyroidism 09/08/2011  . Hyperlipidemia with target  low density lipoprotein (LDL) cholesterol less than 70 mg/dL 22/02/5425  . Neuropathy in diabetes (HCC) 09/08/2011   Past Medical History:  Diagnosis Date  . Chicken pox   . Chronic kidney disease    Stones  . Diabetes mellitus 2012   Dr. Tedd Sias  . Hyperlipidemia   . Hypertension   . Recurrent boils   . Thyroid disease     Family History  Problem Relation Age of Onset  . Cancer Mother        ovarian/uterus  . Hyperlipidemia Father   . Heart disease Father   . Hypertension Father   . Cancer Father        throat  . Hypertension Sister   . Hypertension Brother   . Hypertension Sister   . Hypertension Sister   . Stroke Maternal Grandmother   . Hypertension Maternal Grandmother   . Stroke Maternal Grandfather   . Hypertension Maternal Grandfather   . Hypertension Other   . Heart disease Other   . Sudden death Other   . Diabetes Other     Past Surgical History:  Procedure Laterality Date  . ABDOMINAL HYSTERECTOMY  1998   complete  . BIOPSY THYROID     normal, Dr. Tedd Sias  . BREAST REDUCTION SURGERY    . COLONOSCOPY WITH PROPOFOL N/A 08/13/2014   Procedure: COLONOSCOPY WITH PROPOFOL;  Surgeon: Wallace Cullens, MD;  Location: [facility] ENDOSCOPY;  Service: Gastroenterology;  Laterality: N/A;  . COLONOSCOPY WITH PROPOFOL N/A 11/12/2014   Procedure: COLONOSCOPY WITH PROPOFOL;  Surgeon: Wallace Cullens, MD;  Location[facility] ENDOSCOPY;  Service: Gastroenterology;  Laterality: N/A;  . gluteal abscess drainage  03/17/13  . TUBAL LIGATION     Social History   Occupational History  . Not on file  Tobacco Use  . Smoking status: Never Smoker  . Smokeless tobacco: Never Used  Substance and Sexual Activity  . Alcohol use: No  . Drug use: No  . Sexual activity: Not on file

## 2020-01-18 ENCOUNTER — Encounter: Payer: Self-pay | Admitting: Orthopedic Surgery

## 2020-01-18 ENCOUNTER — Other Ambulatory Visit: Payer: Self-pay

## 2020-01-18 ENCOUNTER — Ambulatory Visit: Payer: HMO | Admitting: Orthopedic Surgery

## 2020-01-18 ENCOUNTER — Ambulatory Visit: Payer: Self-pay

## 2020-01-18 ENCOUNTER — Ambulatory Visit (INDEPENDENT_AMBULATORY_CARE_PROVIDER_SITE_OTHER): Payer: HMO

## 2020-01-18 DIAGNOSIS — M79672 Pain in left foot: Secondary | ICD-10-CM

## 2020-01-18 DIAGNOSIS — M79671 Pain in right foot: Secondary | ICD-10-CM

## 2020-01-18 MED ORDER — DOXYCYCLINE HYCLATE 100 MG PO TABS: 100.0000 mg | ORAL_TABLET | Freq: Two times a day (BID) | ORAL | 0 refills | Status: DC

## 2020-01-18 NOTE — Progress Notes (Signed)
Office Visit Note   Patient: Christie Bryant           Date of Birth: 1955-10-04           MRN: [id number] Visit Date: 01/18/2020              Requested by: Marisue Ivan, MD [phone] [facility] [address] [facility] [place],  [place] [address] PCP: Marisue Ivan, MD  Chief Complaint  Patient presents with  . Left Foot - Pain      HPI: Patient is a 65 year old woman with diabetes who presents with acute redness on the dorsum of the left foot across the midfoot as well as redness around the left great toe.  Patient denies any penetrating trauma denies any drainage she has been wearing a compression sock.  Assessment & Plan: Visit Diagnoses:  1. Right foot pain   2. Pain in left foot     Plan: Patient's midfoot redness seems most consistent with acute Charcot however there is no bone or ligamentous instability at this time we will place her in a fracture boot and start doxycycline for the redness around the great toe.  Discussed that if the redness gets worse she should call us tomorrow.  At follow-up on Monday repeat three-view radiographs of the left foot.  Follow-Up Instructions: Return in about 1 week (around 01/25/2020) for Follow-up on Monday.Gaylord Shih Exam  Patient is alert, oriented, no adenopathy, well-dressed, normal affect, normal respiratory effort. Examination patient has redness across the dorsum of the left midfoot as well as redness around the left great toe with elevation the redness in the toe and the midfoot almost completely resolves.  There is an old ulcer with eschar over the plantar aspect of the great toe a 10 blade knife was used to debride the eschar and there was good healthy granulation tissue no evidence of abscess within the foot or great toe.  Patient does complain of painful lymph nodes in the left groin.  The foot and toe are not painful to palpation.  Patient has had a uric acid at 7.1.  However with the redness.  Nontender this does not seem  like a gout flareup.  Patient does have prescriptions for allopurinol and colchicine.  Imaging: XR Foot Complete Left  Result Date: 01/18/2020 Radiographs shows no destructive changes through the great toe MTP joint.  There is a small cyst in the metatarsal head but no periarticular cyst.  There is no destructive changes across the Lisfranc complex no Charcot collapse.  There is no destructive bony changes in the great toe no foreign body.  No images are attached to the encounter.  Labs: Lab Results  Component Value Date   HGBA1C 7.7 (H) 10/07/2019   HGBA1C 9.4 (A) 01/22/2014   HGBA1C 8.4 (H) 11/20/2013   LABURIC 7.1 (H) 12/12/2019   LABURIC 7.6 (H) 10/17/2019   REPTSTATUS 10/12/2019 FINAL 10/07/2019   GRAMSTAIN No WBC Seen 09/08/2011   GRAMSTAIN Rare Squamous Epithelial Cells Present 09/08/2011   GRAMSTAIN No Organisms Seen 09/08/2011   CULT  10/07/2019    NO GROWTH 5 DAYS Performed at [facility], [address], [place], [place] [address]    LABORGA METHICILLIN RESISTANT STAPHYLOCOCCUS AUREUS 09/08/2011     Lab Results  Component Value Date   ALBUMIN 3.8 10/06/2019   ALBUMIN 3.5 11/20/2013   ALBUMIN 3.4 (L) 03/16/2013   LABURIC 7.1 (H) 12/12/2019   LABURIC 7.6 (H) 10/17/2019    Lab  Results  Component Value Date   MG 2.1 10/08/2019   No results found for: VD25OH  No results found for: PREALBUMIN CBC EXTENDED Latest Ref Rng & Units 10/09/2019 10/08/2019 10/06/2019  WBC 4.0 - 10.5 K/uL 7.5 10.3 12.3(H)  RBC 3.87 - 5.11 MIL/uL 3.83(L) 3.59(L) 4.49  HGB 12.0 - 15.0 g/dL 10.9(L) 10.6(L) 13.0  HCT 36.0 - 46.0 % 34.6(L) 32.9(L) 41.0  PLT 150 - 400 K/uL 162 158 190  NEUTROABS 1.7 - 7.7 K/uL 5.1 7.7 10.1(H)  LYMPHSABS 0.7 - 4.0 K/uL 1.3 1.5 1.0     There is no height or weight on file to calculate BMI.  Orders:  Orders Placed This Encounter  Procedures  . XR Foot Complete Left   Meds ordered this encounter  Medications  . doxycycline (VIBRA-TABS) 100 MG  tablet    Sig: Take 1 tablet (100 mg total) by mouth 2 (two) times daily.    Dispense:  60 tablet    Refill:  0     Procedures: No procedures performed  Clinical Data: No additional findings.  ROS:  All other systems negative, except as noted in the HPI. Review of Systems  Objective: Vital Signs: There were no vitals taken for this visit.  Specialty Comments:  No specialty comments available.  PMFS History: Patient Active Problem List   Diagnosis Date Noted  . Cellulitis of left foot 10/07/2019  . Sepsis due to cellulitis (HCC) 10/07/2019  . Edema 09/05/2019  . Cellulitis of left lower extremity 09/05/2019  . Varicose veins with inflammation 09/05/2019  . DM type 2 with diabetic peripheral neuropathy (HCC) 08/03/2018  . Back pain 07/07/2014  . Cellulitis and abscess of face 03/13/2014  . Dermatitis 05/08/2013  . Routine general medical examination at a health care facility 12/05/2012  . Shingles outbreak 12/28/2011  . MRSA infection 09/16/2011  . Screening for breast cancer 09/08/2011  . Diabetes mellitus type 2, uncontrolled (HCC) 09/08/2011  . Hypertension 09/08/2011  . Hypothyroidism 09/08/2011  . Hyperlipidemia with target low density lipoprotein (LDL) cholesterol less than 70 mg/dL 44/01/270  . Neuropathy in diabetes (HCC) 09/08/2011   Past Medical History:  Diagnosis Date  . Chicken pox   . Chronic kidney disease    Stones  . Diabetes mellitus 2012   Dr. Tedd Sias  . Hyperlipidemia   . Hypertension   . Recurrent boils   . Thyroid disease     Family History  Problem Relation Age of Onset  . Cancer Mother        ovarian/uterus  . Hyperlipidemia Father   . Heart disease Father   . Hypertension Father   . Cancer Father        throat  . Hypertension Sister   . Hypertension Brother   . Hypertension Sister   . Hypertension Sister   . Stroke Maternal Grandmother   . Hypertension Maternal Grandmother   . Stroke Maternal Grandfather   . Hypertension  Maternal Grandfather   . Hypertension Other   . Heart disease Other   . Sudden death Other   . Diabetes Other     Past Surgical History:  Procedure Laterality Date  . ABDOMINAL HYSTERECTOMY  1998   complete  . BIOPSY THYROID     normal, Dr. Tedd Sias  . BREAST REDUCTION SURGERY    . COLONOSCOPY WITH PROPOFOL N/A 08/13/2014   Procedure: COLONOSCOPY WITH PROPOFOL;  Surgeon: Wallace Cullens, MD;  Location: [facility] ENDOSCOPY;  Service: Gastroenterology;  Laterality: N/A;  . COLONOSCOPY WITH  PROPOFOL N/A 11/12/2014   Procedure: COLONOSCOPY WITH PROPOFOL;  Surgeon: Hulen Luster, MD;  Location: [facility] ENDOSCOPY;  Service: Gastroenterology;  Laterality: N/A;  . gluteal abscess drainage  03/17/13  . TUBAL LIGATION     Social History   Occupational History  . Not on file  Tobacco Use  . Smoking status: Never Smoker  . Smokeless tobacco: Never Used  Substance and Sexual Activity  . Alcohol use: No  . Drug use: No  . Sexual activity: Not on file

## 2020-01-22 ENCOUNTER — Encounter: Payer: Self-pay | Admitting: Orthopedic Surgery

## 2020-01-22 ENCOUNTER — Ambulatory Visit (INDEPENDENT_AMBULATORY_CARE_PROVIDER_SITE_OTHER): Payer: HMO

## 2020-01-22 ENCOUNTER — Ambulatory Visit: Payer: HMO | Admitting: Orthopedic Surgery

## 2020-01-22 DIAGNOSIS — M79672 Pain in left foot: Secondary | ICD-10-CM

## 2020-01-22 NOTE — Progress Notes (Signed)
Office Visit Note   Patient: Christie Bryant           Date of Birth: [birth year]           MRN: [id number] Visit Date: 01/22/2020              Requested by: Marisue Ivan, MD [phone] [facility] [address] [facility] [place],  [place] [address] PCP: Marisue Ivan, MD  Chief Complaint  Patient presents with  . Left Foot - Follow-up      HPI: Patient presents in follow-up today for her left foot.  She was seen a few days ago by Dr. Lajoyce Corners who had concerns for either a gouty process or infection.  She was placed on a course of doxycycline.  She is also taking allopurinol.  She is wearing a cam walker boot.  Overall she thinks things have improved  Assessment & Plan: Visit Diagnoses:  1. Pain in left foot     Plan: Continue with boot and antibiotics follow-up in 1 week.  Sooner if any increasing redness  Follow-Up Instructions: No follow-ups on file.   Ortho Exam  Patient is alert, oriented, no adenopathy, well-dressed, normal affect, normal respiratory effort. Left foot she does have some erythema on the dorsum of her foot and over her left great toe.  There is no drainage from the previous debrided ulcer on the plantar surface and does not need debriding today.  No fluctuance no ascending cellulitis  Imaging: No results found. No images are attached to the encounter.  Labs: Lab Results  Component Value Date   HGBA1C 7.7 (H) 10/07/2019   HGBA1C 9.4 (A) 01/22/2014   HGBA1C 8.4 (H) 11/20/2013   LABURIC 7.1 (H) 12/12/2019   LABURIC 7.6 (H) 10/17/2019   REPTSTATUS 10/12/2019 FINAL 10/07/2019   GRAMSTAIN No WBC Seen 09/08/2011   GRAMSTAIN Rare Squamous Epithelial Cells Present 09/08/2011   GRAMSTAIN No Organisms Seen 09/08/2011   CULT  10/07/2019    NO GROWTH 5 DAYS Performed at [facility], [address], [place], [place] [address]    LABORGA METHICILLIN RESISTANT STAPHYLOCOCCUS AUREUS 09/08/2011     Lab Results  Component Value Date   ALBUMIN 3.8  10/06/2019   ALBUMIN 3.5 11/20/2013   ALBUMIN 3.4 (L) 03/16/2013   LABURIC 7.1 (H) 12/12/2019   LABURIC 7.6 (H) 10/17/2019    Lab Results  Component Value Date   MG 2.1 10/08/2019   No results found for: VD25OH  No results found for: PREALBUMIN CBC EXTENDED Latest Ref Rng & Units 10/09/2019 10/08/2019 10/06/2019  WBC 4.0 - 10.5 K/uL 7.5 10.3 12.3(H)  RBC 3.87 - 5.11 MIL/uL 3.83(L) 3.59(L) 4.49  HGB 12.0 - 15.0 g/dL 10.9(L) 10.6(L) 13.0  HCT 36.0 - 46.0 % 34.6(L) 32.9(L) 41.0  PLT 150 - 400 K/uL 162 158 190  NEUTROABS 1.7 - 7.7 K/uL 5.1 7.7 10.1(H)  LYMPHSABS 0.7 - 4.0 K/uL 1.3 1.5 1.0     There is no height or weight on file to calculate BMI.  Orders:  Orders Placed This Encounter  Procedures  . XR Foot Complete Left   No orders of the defined types were placed in this encounter.    Procedures: No procedures performed  Clinical Data: No additional findings.  ROS:  All other systems negative, except as noted in the HPI. Review of Systems  Objective: Vital Signs: There were no vitals taken for this visit.  Specialty Comments:  No specialty comments available.  PMFS History: Patient  Active Problem List   Diagnosis Date Noted  . Cellulitis of left foot 10/07/2019  . Sepsis due to cellulitis (HCC) 10/07/2019  . Edema 09/05/2019  . Cellulitis of left lower extremity 09/05/2019  . Varicose veins with inflammation 09/05/2019  . DM type 2 with diabetic peripheral neuropathy (HCC) 08/03/2018  . Back pain 07/07/2014  . Cellulitis and abscess of face 03/13/2014  . Dermatitis 05/08/2013  . Routine general medical examination at a health care facility 12/05/2012  . Shingles outbreak 12/28/2011  . MRSA infection 09/16/2011  . Screening for breast cancer 09/08/2011  . Diabetes mellitus type 2, uncontrolled (HCC) 09/08/2011  . Hypertension 09/08/2011  . Hypothyroidism 09/08/2011  . Hyperlipidemia with target low density lipoprotein (LDL) cholesterol less than 70  mg/dL 84/66/5993  . Neuropathy in diabetes (HCC) 09/08/2011   Past Medical History:  Diagnosis Date  . Chicken pox   . Chronic kidney disease    Stones  . Diabetes mellitus 2012   Dr. Tedd Sias  . Hyperlipidemia   . Hypertension   . Recurrent boils   . Thyroid disease     Family History  Problem Relation Age of Onset  . Cancer Mother        ovarian/uterus  . Hyperlipidemia Father   . Heart disease Father   . Hypertension Father   . Cancer Father        throat  . Hypertension Sister   . Hypertension Brother   . Hypertension Sister   . Hypertension Sister   . Stroke Maternal Grandmother   . Hypertension Maternal Grandmother   . Stroke Maternal Grandfather   . Hypertension Maternal Grandfather   . Hypertension Other   . Heart disease Other   . Sudden death Other   . Diabetes Other     Past Surgical History:  Procedure Laterality Date  . ABDOMINAL HYSTERECTOMY  1998   complete  . BIOPSY THYROID     normal, Dr. Tedd Sias  . BREAST REDUCTION SURGERY    . COLONOSCOPY WITH PROPOFOL N/A 08/13/2014   Procedure: COLONOSCOPY WITH PROPOFOL;  Surgeon: Wallace Cullens, MD;  Location: [facility] ENDOSCOPY;  Service: Gastroenterology;  Laterality: N/A;  . COLONOSCOPY WITH PROPOFOL N/A 11/12/2014   Procedure: COLONOSCOPY WITH PROPOFOL;  Surgeon: Wallace Cullens, MD;  Location: [facility] ENDOSCOPY;  Service: Gastroenterology;  Laterality: N/A;  . gluteal abscess drainage  03/17/13  . TUBAL LIGATION     Social History   Occupational History  . Not on file  Tobacco Use  . Smoking status: Never Smoker  . Smokeless tobacco: Never Used  Substance and Sexual Activity  . Alcohol use: No  . Drug use: No  . Sexual activity: Not on file

## 2020-01-29 ENCOUNTER — Ambulatory Visit: Payer: HMO | Admitting: Physician Assistant

## 2020-02-08 ENCOUNTER — Ambulatory Visit (INDEPENDENT_AMBULATORY_CARE_PROVIDER_SITE_OTHER): Payer: HMO | Admitting: Physician Assistant

## 2020-02-08 ENCOUNTER — Encounter: Payer: Self-pay | Admitting: Physician Assistant

## 2020-02-08 DIAGNOSIS — L03116 Cellulitis of left lower limb: Secondary | ICD-10-CM

## 2020-02-08 NOTE — Progress Notes (Signed)
Office Visit Note   Patient: Christie Bryant           Date of Birth: [birth year]           MRN: [id number] Visit Date: 02/08/2020              Requested by: Marisue Ivan, MD [phone] [facility] [address] [facility] [place],  [place] [address] PCP: Marisue Ivan, MD  No chief complaint on file.     HPI: This is a pleasant 65 year old woman who presents in follow-up today for her great toe.  She is continue taking doxycycline there was concern of her having a infective process versus gout.  She did have proximal lymph nodes that were palpable and painful in her upper thigh.  She is feeling much better.  She is wearing the boot and has no pain in her thigh.  The area beneath her great toe is thin callused over  Assessment & Plan: Visit Diagnoses: No diagnosis found.  Plan: We discussed transitioning to a supportive shoe such as a [place].  Will finish antibiotics.  Follow-up in 1 month.  Sooner if any return of symptoms  Follow-Up Instructions: No follow-ups on file.   Ortho Exam  Patient is alert, oriented, no adenopathy, well-dressed, normal affect, normal respiratory effort. Examination of the toe demonstrates mild soft soft tissue swelling no pain no tenderness no ascending cellulitis she has a very thin callus on the bottom of her great toe.  No evidence of any acute infection at this time  Imaging: No results found. No images are attached to the encounter.  Labs: Lab Results  Component Value Date   HGBA1C 7.7 (H) 10/07/2019   HGBA1C 9.4 (A) 01/22/2014   HGBA1C 8.4 (H) 11/20/2013   LABURIC 7.1 (H) 12/12/2019   LABURIC 7.6 (H) 10/17/2019   REPTSTATUS 10/12/2019 FINAL 10/07/2019   GRAMSTAIN No WBC Seen 09/08/2011   GRAMSTAIN Rare Squamous Epithelial Cells Present 09/08/2011   GRAMSTAIN No Organisms Seen 09/08/2011   CULT  10/07/2019    NO GROWTH 5 DAYS Performed at [facility], [address] [place], [place] [address]    LABORGA METHICILLIN RESISTANT  STAPHYLOCOCCUS AUREUS 09/08/2011     Lab Results  Component Value Date   ALBUMIN 3.8 10/06/2019   ALBUMIN 3.5 11/20/2013   ALBUMIN 3.4 (L) 03/16/2013   LABURIC 7.1 (H) 12/12/2019   LABURIC 7.6 (H) 10/17/2019    Lab Results  Component Value Date   MG 2.1 10/08/2019   No results found for: VD25OH  No results found for: PREALBUMIN CBC EXTENDED Latest Ref Rng & Units 10/09/2019 10/08/2019 10/06/2019  WBC 4.0 - 10.5 K/uL 7.5 10.3 12.3(H)  RBC 3.87 - 5.11 MIL/uL 3.83(L) 3.59(L) 4.49  HGB 12.0 - 15.0 g/dL 10.9(L) 10.6(L) 13.0  HCT 36.0 - 46.0 % 34.6(L) 32.9(L) 41.0  PLT 150 - 400 K/uL 162 158 190  NEUTROABS 1.7 - 7.7 K/uL 5.1 7.7 10.1(H)  LYMPHSABS 0.7 - 4.0 K/uL 1.3 1.5 1.0     There is no height or weight on file to calculate BMI.  Orders:  No orders of the defined types were placed in this encounter.  No orders of the defined types were placed in this encounter.    Procedures: No procedures performed  Clinical Data: No additional findings.  ROS:  All other systems negative, except as noted in the HPI. Review of Systems  Objective: Vital Signs: There were no vitals taken for this visit.  Specialty Comments:  No specialty comments available.  PMFS History: Patient Active Problem List   Diagnosis Date Noted  . Cellulitis of left foot 10/07/2019  . Sepsis due to cellulitis (HCC) 10/07/2019  . Edema 09/05/2019  . Cellulitis of left lower extremity 09/05/2019  . Varicose veins with inflammation 09/05/2019  . DM type 2 with diabetic peripheral neuropathy (HCC) 08/03/2018  . Back pain 07/07/2014  . Cellulitis and abscess of face 03/13/2014  . Dermatitis 05/08/2013  . Routine general medical examination at a health care facility 12/05/2012  . Shingles outbreak 12/28/2011  . MRSA infection 09/16/2011  . Screening for breast cancer 09/08/2011  . Diabetes mellitus type 2, uncontrolled (HCC) 09/08/2011  . Hypertension 09/08/2011  . Hypothyroidism 09/08/2011  .  Hyperlipidemia with target low density lipoprotein (LDL) cholesterol less than 70 mg/dL 26/83/4196  . Neuropathy in diabetes (HCC) 09/08/2011   Past Medical History:  Diagnosis Date  . Chicken pox   . Chronic kidney disease    Stones  . Diabetes mellitus 2012   Dr. Tedd Sias  . Hyperlipidemia   . Hypertension   . Recurrent boils   . Thyroid disease     Family History  Problem Relation Age of Onset  . Cancer Mother        ovarian/uterus  . Hyperlipidemia Father   . Heart disease Father   . Hypertension Father   . Cancer Father        throat  . Hypertension Sister   . Hypertension Brother   . Hypertension Sister   . Hypertension Sister   . Stroke Maternal Grandmother   . Hypertension Maternal Grandmother   . Stroke Maternal Grandfather   . Hypertension Maternal Grandfather   . Hypertension Other   . Heart disease Other   . Sudden death Other   . Diabetes Other     Past Surgical History:  Procedure Laterality Date  . ABDOMINAL HYSTERECTOMY  1998   complete  . BIOPSY THYROID     normal, Dr. Tedd Sias  . BREAST REDUCTION SURGERY    . COLONOSCOPY WITH PROPOFOL N/A 08/13/2014   Procedure: COLONOSCOPY WITH PROPOFOL;  Surgeon: Wallace Cullens, MD;  Location[facility] ENDOSCOPY;  Service: Gastroenterology;  Laterality: N/A;  . COLONOSCOPY WITH PROPOFOL N/A 11/12/2014   Procedure: COLONOSCOPY WITH PROPOFOL;  Surgeon: Wallace Cullens, MD;  Location: [facility] ENDOSCOPY;  Service: Gastroenterology;  Laterality: N/A;  . gluteal abscess drainage  03/17/13  . TUBAL LIGATION     Social History   Occupational History  . Not on file  Tobacco Use  . Smoking status: Never Smoker  . Smokeless tobacco: Never Used  Substance and Sexual Activity  . Alcohol use: No  . Drug use: No  . Sexual activity: Not on file

## 2020-03-07 ENCOUNTER — Ambulatory Visit: Payer: HMO | Admitting: Physician Assistant

## 2020-03-22 DIAGNOSIS — Z0000 Encounter for general adult medical examination without abnormal findings: Secondary | ICD-10-CM | POA: Diagnosis not present

## 2020-03-22 DIAGNOSIS — E039 Hypothyroidism, unspecified: Secondary | ICD-10-CM | POA: Diagnosis not present

## 2020-03-22 DIAGNOSIS — E782 Mixed hyperlipidemia: Secondary | ICD-10-CM | POA: Diagnosis not present

## 2020-03-22 DIAGNOSIS — I10 Essential (primary) hypertension: Secondary | ICD-10-CM | POA: Diagnosis not present

## 2020-03-22 DIAGNOSIS — E1165 Type 2 diabetes mellitus with hyperglycemia: Secondary | ICD-10-CM | POA: Diagnosis not present

## 2020-03-29 DIAGNOSIS — I10 Essential (primary) hypertension: Secondary | ICD-10-CM | POA: Diagnosis not present

## 2020-03-29 DIAGNOSIS — Z794 Long term (current) use of insulin: Secondary | ICD-10-CM | POA: Diagnosis not present

## 2020-03-29 DIAGNOSIS — N183 Chronic kidney disease, stage 3 unspecified: Secondary | ICD-10-CM | POA: Diagnosis not present

## 2020-03-29 DIAGNOSIS — E1122 Type 2 diabetes mellitus with diabetic chronic kidney disease: Secondary | ICD-10-CM | POA: Diagnosis not present

## 2020-03-29 DIAGNOSIS — E039 Hypothyroidism, unspecified: Secondary | ICD-10-CM | POA: Diagnosis not present

## 2020-04-09 ENCOUNTER — Ambulatory Visit: Payer: HMO | Admitting: Orthopedic Surgery

## 2020-04-19 DIAGNOSIS — H04302 Unspecified dacryocystitis of left lacrimal passage: Secondary | ICD-10-CM | POA: Diagnosis not present

## 2020-05-22 DIAGNOSIS — H04303 Unspecified dacryocystitis of bilateral lacrimal passages: Secondary | ICD-10-CM | POA: Diagnosis not present

## 2020-06-18 ENCOUNTER — Ambulatory Visit: Payer: HMO | Admitting: Physician Assistant

## 2020-06-18 ENCOUNTER — Ambulatory Visit (INDEPENDENT_AMBULATORY_CARE_PROVIDER_SITE_OTHER): Payer: HMO

## 2020-06-18 ENCOUNTER — Encounter: Payer: Self-pay | Admitting: Orthopedic Surgery

## 2020-06-18 DIAGNOSIS — M79644 Pain in right finger(s): Secondary | ICD-10-CM

## 2020-06-18 NOTE — Progress Notes (Signed)
Office Visit Note   Patient: Christie Bryant           Date of Birth: August 14, 1955           MRN: [id number] Visit Date: 06/18/2020              Requested by: Marisue Ivan, MD [phone] [facility] [address] [facility] [place],  [place] [address] PCP: Marisue Ivan, MD  Chief Complaint  Patient presents with  . Right Foot - Pain      HPI: Patient is a pleasant 65 year old woman who comes in follow-up today for her great toe.  This is almost completely resolved she has no more redness no pain.  She has noticed that she does have some pain on the bottom of her heel.  Effexor especially when she is been sitting for a while and goes to get up she can.  She has had a history in the remote past of plantar fasciitis.  She is also had some difficulties with her right thumb.  She denies any injury.  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 Thumb pain, right     Plan: Findings consistent with trigger finger of the thumb.  She will apply Voltaren gel to this 3-4 times a day.  Also paraffin baths.  With regards to her Achilles and plantar fasciitis we talked about different shoe modifications she could try also demonstrated to her Achilles and plantar fascia stretching follow-up in 1 month  Follow-Up Instructions: No follow-ups on file.   Ortho Exam  Patient is alert, oriented, no adenopathy, well-dressed, normal affect, normal respiratory effort. Right thumb no erythema no swelling she does have clicking in the base of the volar surface of the thumb consistent with a trigger finger no signs of infection strong radial pulse  Left foot no erythema no swelling she has no tenderness in the back of the heel no fluctuance no skin breakdown she is focally tender on the plantar surface at the insertion of the plantar fascia.  This coordinates with a previous x-ray of a spur  Imaging: No  results found. No images are attached to the encounter.  Labs: Lab Results  Component Value Date   HGBA1C 7.7 (H) 10/07/2019   HGBA1C 9.4 (A) 01/22/2014   HGBA1C 8.4 (H) 11/20/2013   LABURIC 7.1 (H) 12/12/2019   LABURIC 7.6 (H) 10/17/2019   REPTSTATUS 10/12/2019 FINAL 10/07/2019   GRAMSTAIN No WBC Seen 09/08/2011   GRAMSTAIN Rare Squamous Epithelial Cells Present 09/08/2011   GRAMSTAIN No Organisms Seen 09/08/2011   CULT  10/07/2019    NO GROWTH 5 DAYS Performed at [facility], [address], [place], [place] [address]    LABORGA METHICILLIN RESISTANT STAPHYLOCOCCUS AUREUS 09/08/2011     Lab Results  Component Value Date   ALBUMIN 3.8 10/06/2019   ALBUMIN 3.5 11/20/2013   ALBUMIN 3.4 (L) 03/16/2013    Lab Results  Component Value Date   MG 2.1 10/08/2019   No results found for: VD25OH  No results found for: PREALBUMIN CBC EXTENDED Latest Ref Rng & Units 10/09/2019 10/08/2019 10/06/2019  WBC 4.0 - 10.5 K/uL 7.5 10.3 12.3(H)  RBC 3.87 - 5.11 MIL/uL 3.83(L) 3.59(L) 4.49  HGB 12.0 - 15.0 g/dL 10.9(L) 10.6(L) 13.0  HCT 36.0 - 46.0 % 34.6(L) 32.9(L) 41.0  PLT 150 - 400 K/uL 162 158 190  NEUTROABS 1.7 -  7.7 K/uL 5.1 7.7 10.1(H)  LYMPHSABS 0.7 - 4.0 K/uL 1.3 1.5 1.0     There is no height or weight on file to calculate BMI.  Orders:  Orders Placed This Encounter  Procedures  . XR Hand Complete Right   No orders of the defined types were placed in this encounter.    Procedures: No procedures performed  Clinical Data: No additional findings.  ROS:  All other systems negative, except as noted in the HPI. Review of Systems  Objective: Vital Signs: There were no vitals taken for this visit.  Specialty Comments:  No specialty comments available.  PMFS History: Patient Active Problem List   Diagnosis Date Noted  . Cellulitis of left foot 10/07/2019  . Sepsis due to cellulitis (HCC) 10/07/2019  . Edema 09/05/2019  . Cellulitis of left lower  extremity 09/05/2019  . Varicose veins with inflammation 09/05/2019  . DM type 2 with diabetic peripheral neuropathy (HCC) 08/03/2018  . Back pain 07/07/2014  . Cellulitis and abscess of face 03/13/2014  . Dermatitis 05/08/2013  . Routine general medical examination at a health care facility 12/05/2012  . Shingles outbreak 12/28/2011  . MRSA infection 09/16/2011  . Screening for breast cancer 09/08/2011  . Diabetes mellitus type 2, uncontrolled (HCC) 09/08/2011  . Hypertension 09/08/2011  . Hypothyroidism 09/08/2011  . Hyperlipidemia with target low density lipoprotein (LDL) cholesterol less than 70 mg/dL 00/93/8182  . Neuropathy in diabetes (HCC) 09/08/2011   Past Medical History:  Diagnosis Date  . Chicken pox   . Chronic kidney disease    Stones  . Diabetes mellitus 2012   Dr. Tedd Sias  . Hyperlipidemia   . Hypertension   . Recurrent boils   . Thyroid disease     Family History  Problem Relation Age of Onset  . Cancer Mother        ovarian/uterus  . Hyperlipidemia Father   . Heart disease Father   . Hypertension Father   . Cancer Father        throat  . Hypertension Sister   . Hypertension Brother   . Hypertension Sister   . Hypertension Sister   . Stroke Maternal Grandmother   . Hypertension Maternal Grandmother   . Stroke Maternal Grandfather   . Hypertension Maternal Grandfather   . Hypertension Other   . Heart disease Other   . Sudden death Other   . Diabetes Other     Past Surgical History:  Procedure Laterality Date  . ABDOMINAL HYSTERECTOMY  1998   complete  . BIOPSY THYROID     normal, Dr. Tedd Sias  . BREAST REDUCTION SURGERY    . COLONOSCOPY WITH PROPOFOL N/A 08/13/2014   Procedure: COLONOSCOPY WITH PROPOFOL;  Surgeon: Wallace Cullens, MD;  Location: [facility] END[facility]COPY;  Service: Gastroenterology;  Laterality: N/A;  . COLONOSCOPY WITH PROPOFOL N/A 11/12/2014   Procedure: COLONOSCOPY WITH PROPOFOL;  Surgeon: Wallace Cullens, MD;  Location: [facility] ENDOSCOPY;  Service:  Gastroenterology;  Laterality: N/A;  . gluteal abscess drainage  03/17/13  . TUBAL LIGATION     Social History   Occupational History  . Not on file  Tobacco Use  . Smoking status: Never Smoker  . Smokeless tobacco: Never Used  Substance and Sexual Activity  . Alcohol use: No  . Drug use: No  . Sexual activity: Not on file

## 2020-07-01 DIAGNOSIS — Z794 Long term (current) use of insulin: Secondary | ICD-10-CM | POA: Diagnosis not present

## 2020-07-01 DIAGNOSIS — I10 Essential (primary) hypertension: Secondary | ICD-10-CM | POA: Diagnosis not present

## 2020-07-01 DIAGNOSIS — E1122 Type 2 diabetes mellitus with diabetic chronic kidney disease: Secondary | ICD-10-CM | POA: Diagnosis not present

## 2020-07-01 DIAGNOSIS — N183 Chronic kidney disease, stage 3 unspecified: Secondary | ICD-10-CM | POA: Diagnosis not present

## 2020-07-01 LAB — COMPREHENSIVE METABOLIC PANEL
Calcium: 9.6 (ref 8.7–10.7)
GFR calc non Af Amer: 50

## 2020-07-01 LAB — BASIC METABOLIC PANEL
BUN: 14 (ref 4–21)
CO2: 26 — AB (ref 13–22)
Chloride: 106 (ref 99–108)
Creatinine: 1.1 (ref 0.5–1.1)
Glucose: 141
Potassium: 4.4 (ref 3.4–5.3)
Sodium: 139 (ref 137–147)

## 2020-07-01 LAB — HM MAMMOGRAPHY

## 2020-07-01 LAB — HEMOGLOBIN A1C: Hemoglobin A1C: 7.6

## 2020-07-18 ENCOUNTER — Ambulatory Visit: Payer: HMO | Admitting: Orthopedic Surgery

## 2020-08-21 ENCOUNTER — Encounter: Payer: Self-pay | Admitting: Family Medicine

## 2020-08-21 ENCOUNTER — Other Ambulatory Visit: Payer: Self-pay

## 2020-08-21 ENCOUNTER — Telehealth: Payer: Self-pay

## 2020-08-21 ENCOUNTER — Ambulatory Visit (INDEPENDENT_AMBULATORY_CARE_PROVIDER_SITE_OTHER): Payer: HMO | Admitting: Family Medicine

## 2020-08-21 DIAGNOSIS — E039 Hypothyroidism, unspecified: Secondary | ICD-10-CM

## 2020-08-21 DIAGNOSIS — I10 Essential (primary) hypertension: Secondary | ICD-10-CM | POA: Diagnosis not present

## 2020-08-21 DIAGNOSIS — E1142 Type 2 diabetes mellitus with diabetic polyneuropathy: Secondary | ICD-10-CM | POA: Diagnosis not present

## 2020-08-21 DIAGNOSIS — M1A9XX0 Chronic gout, unspecified, without tophus (tophi): Secondary | ICD-10-CM | POA: Diagnosis not present

## 2020-08-21 DIAGNOSIS — E785 Hyperlipidemia, unspecified: Secondary | ICD-10-CM | POA: Diagnosis not present

## 2020-08-21 DIAGNOSIS — M109 Gout, unspecified: Secondary | ICD-10-CM | POA: Insufficient documentation

## 2020-08-21 MED ORDER — OZEMPIC (0.25 OR 0.5 MG/DOSE) 2 MG/1.5ML ~~LOC~~ SOPN: PEN_INJECTOR | SUBCUTANEOUS | 0 refills | Status: AC

## 2020-08-21 MED ORDER — ALLOPURINOL 100 MG PO TABS: 50.0000 mg | ORAL_TABLET | Freq: Every day | ORAL | 3 refills | Status: DC

## 2020-08-21 MED ORDER — "PEN NEEDLES 3/16"" 31G X 5 MM MISC": 1.0000 | 12 refills | Status: AC

## 2020-08-21 MED ORDER — NORTRIPTYLINE HCL 10 MG PO CAPS: 10.0000 mg | ORAL_CAPSULE | Freq: Every day | ORAL | 2 refills | Status: DC

## 2020-08-21 NOTE — Chronic Care Management (AMB) (Signed)
  Chronic Care Management   Note  08/21/2020 Name: [name] MRN: [id number] DOB: [birth year]  Christie Bryant is a 65 y.o. year old female who is a primary care patient of Valerie Roys, DO. I reached out to [employer] by phone today in response to a referral sent by Christie Bryant PCP, Valerie Roys, DO      Christie Bryant was given information about Chronic Care Management services today including:  CCM service includes personalized support from designated clinical staff supervised by her physician, including individualized plan of care and coordination with other care providers 24/7 contact phone numbers for assistance for urgent and routine care needs. Service will only be billed when office clinical staff spend 20 minutes or more in a month to coordinate care. Only one practitioner may furnish and bill the service in a calendar month. The patient may stop CCM services at any time (effective at the end of the month) by phone call to the office staff. The patient will be responsible for cost sharing (co-pay) of up to 20% of the service fee (after annual deductible is met).  Patient agreed to services and verbal consent obtained.   Follow up plan: Telephone appointment with care management team member scheduled for:09/02/2020  Noreene Larsson, [address], [place], [place] [address] Direct Dial: [phone] Christie Bryant Website: [name]

## 2020-08-21 NOTE — Assessment & Plan Note (Signed)
Not on any medication. Very loopy on gabapentin. Will try nortriptyline and recheck 1 month. Call with any concerns.

## 2020-08-21 NOTE — Progress Notes (Signed)
BP 131/70   Pulse 61   Temp 98.2 F (36.8 C) (Oral)   Ht 5' 7.7" (1.72 m)   Wt 233 lb 9.6 oz (106 kg)   SpO2 98%   BMI 35.83 kg/m    Subjective:    Patient ID: Christie Bryant, female    DOB: Mar 01, 1955, 65 y.o.   MRN: [id number]  HPI: Christie Bryant is a 65 y.o. female  Chief Complaint  Patient presents with   Establish Care    Pt states she wants to discuss headaches. States she has been having them for the past 6 months    Peripheral Neuropathy    Pt states she has been having trouble with pain in her legs and feet lately    Pain    Pt states she has been feeling some pain in her R side lately. States she has to help her husband a lot so she wonders if it may be from that    Has been having headaches on and off for about 6 months. Has been waking up with the headaches. Seem to sit over R temple, goes away with tylenol. Headaches last hours at a time. She does note that they are worse with eating dinner. Not worse with different positions. No issues with her muscles. She has been having headaches, but these are worse and more often  NEUROPATHY Neuropathy status: worsening  Satisfied with current treatment?: not on anything Location: lower legs, L >R Pain: yes Severity: moderate  Quality:  aching Frequency: constant- worse at night Bilateral: yes Symmetric: no Numbness: yes Decreased sensation: yes Weakness: yes Context: worse Treatments attempted: none  HYPERTENSION / HYPERLIPIDEMIA Satisfied with current treatment? yes Duration of hypertension: chronic BP monitoring frequency: not checking BP medication side effects: no Past BP meds: metoprolol, triamterene, HCTZ Duration of hyperlipidemia: chronic Cholesterol medication side effects: no Cholesterol supplements: none Past cholesterol medications: pravastatin Medication compliance: excellent compliance Aspirin: no Recent stressors: no Recurrent headaches: no Visual changes: no Palpitations: no Dyspnea:  no Chest pain: no Lower extremity edema: no Dizzy/lightheaded: no  DIABETES Hypoglycemic episodes:no Polydipsia/polyuria: no Visual disturbance: no Chest pain: no Paresthesias: no Glucose Monitoring: yes  Accucheck frequency: BID  Fasting BS: 120 Taking Insulin?: yes Blood Pressure Monitoring: not checking Retinal Examination: Up to Date Foot Exam: Up to Date Diabetic Education: Completed Pneumovax: Up to Date Influenza: Up to Date Aspirin: no  HYPOTHYROIDISM Thyroid control status:stable Satisfied with current treatment? yes Medication side effects: no Medication compliance: excellent compliance Recent dose adjustment:no Fatigue: no Cold intolerance: no Heat intolerance: no Weight gain: no Weight loss: no Constipation: no Diarrhea/loose stools: no Palpitations: no Lower extremity edema: no Anxiety/depressed mood: no  No gout flares- tolerating her allopurinol well. No concerns.    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   Gout 08/21/2020   Resolved Ambulatory Problems    Diagnosis Date Noted   Cellulitis and abscess of face 09/08/2011   Screening for breast cancer 09/08/2011   Diabetes mellitus type 2, uncontrolled (HCC) 09/08/2011   MRSA infection 09/16/2011   Shingles outbreak 12/28/2011   Routine general medical examination at a health care facility 12/05/2012   Gluteal  abscess 03/16/2013   Cellulitis and abscess of buttock 03/16/2013   Dermatitis 05/08/2013   Cellulitis and abscess of face 03/13/2014   Back pain 07/07/2014   Cellulitis of left lower extremity 09/05/2019   Cellulitis of left foot 10/07/2019    Sepsis due to cellulitis (HCC) 10/07/2019   Past Medical History:  Diagnosis Date   Chicken pox    Chronic kidney disease    Diabetes mellitus 2012   Hyperlipidemia    Neuromuscular disorder (HCC)    Recurrent boils    Thyroid disease    Past Surgical History:  Procedure Laterality Date   ABDOMINAL HYSTERECTOMY  01/13/1996   complete   BIOPSY THYROID     normal, Dr. Tedd Sias   BREAST REDUCTION SURGERY     COLONOSCOPY WITH PROPOFOL N/A 08/13/2014   Procedure: COLONOSCOPY WITH PROPOFOL;  Surgeon: Wallace Cullens, MD;  Location: [facility] ENDOSCOPY;  Service: Gastroenterology;  Laterality: N/A;   COLONOSCOPY WITH PROPOFOL N/A 11/12/2014   Procedure: COLONOSCOPY WITH PROPOFOL;  Surgeon: Wallace Cullens, MD;  Location: [facility] ENDOSCOPY;  Service: Gastroenterology;  Laterality: N/A;   COSMETIC SURGERY     gluteal abscess drainage  03/17/2013   TUBAL LIGATION     Outpatient Encounter Medications as of 08/21/2020  Medication Sig   acetaminophen (TYLENOL) 325 MG tablet Take 2 tablets (650 mg total) by mouth every 6 (six) hours as needed for mild pain (or Fever >/= 101).   Cholecalciferol (VITAMIN D-3 PO) Take 2,000 Units by mouth daily.    docusate sodium (COLACE) 100 MG capsule Take 100 mg by mouth daily.    glimepiride (AMARYL) 2 MG tablet Take 2 mg by mouth every morning.   Insulin Pen Needle (PEN NEEDLES 3/16") 31G X 5 MM MISC 1 each by Does not apply route once a week.   levothyroxine (SYNTHROID) 125 MCG tablet TAKE 1 TABLET BY MOUTH ONCE DAILY ON  AN  EMPTY  STOMACH  WITH  A  GLASS  OF  WATER  AT  LEAST  30  TO  60  MINUTES  BEFORE  BREAKFAST   metFORMIN (GLUCOPHAGE) 1000 MG tablet Take 1,000 mg by mouth 2 (two) times daily with a meal.   metoprolol tartrate (LOPRESSOR) 100 MG tablet Take 1 tablet by mouth 2 (two) times daily.   Multiple Vitamin (MULTIVITAMIN) capsule Take 1 capsule by mouth daily.   nortriptyline (PAMELOR) 10 MG capsule Take 1 capsule (10 mg total) by mouth at bedtime.   omeprazole  (PRILOSEC) 20 MG capsule Take 20 mg by mouth daily.   pravastatin (PRAVACHOL) 40 MG tablet TAKE ONE TABLET BY MOUTH ONCE DAILY (Patient taking differently: Take 40 mg by mouth every evening.)   Semaglutide,0.25 or 0.5MG /DOS, (OZEMPIC, 0.25 OR 0.5 MG/DOSE,) 2 MG/1.5ML SOPN Inject 0.25 mg into the skin once a week for 4 days, THEN 0.5 mg once a week for 4 days.   TRESIBA FLEXTOUCH 200 UNIT/ML FlexTouch Pen Inject 60-100 Units into the skin at bedtime.    triamterene-hydrochlorothiazide (MAXZIDE) 75-50 MG tablet Take 1/2 (one-half) tablet by mouth once daily   [DISCONTINUED] allopurinol (ZYLOPRIM) 100 MG tablet Take 1 tablet (100 mg total) by mouth 2 (two) times daily.   [DISCONTINUED] levothyroxine (SYNTHROID, LEVOTHROID) 112 MCG tablet TAKE ONE TABLET BY MOUTH ONCE DAILY (Patient taking differently: Take 112 mcg by mouth daily before breakfast.)   allopurinol (ZYLOPRIM) 100 MG tablet Take 0.5 tablets (50 mg total) by mouth daily.   [DISCONTINUED] aspirin 81 MG tablet Take 81  mg by mouth daily.   [DISCONTINUED] Colchicine 0.6 MG CAPS Take 0.6 mg by mouth 2 (two) times daily as needed (Gout Flair).   [DISCONTINUED] doxycycline (VIBRA-TABS) 100 MG tablet Take 1 tablet (100 mg total) by mouth 2 (two) times daily.   [DISCONTINUED] FARXIGA 10 MG TABS tablet Take 10 mg by mouth every morning.   [DISCONTINUED] metoprolol tartrate (LOPRESSOR) 25 MG tablet Take 1 tablet (25 mg total) by mouth 2 (two) times daily.   No facility-administered encounter medications on file as of 08/21/2020.   No Known Allergies  Social History   Socioeconomic History   Marital status: Married    Spouse name: Not on file   Number of children: Not on file   Years of education: Not on file   Highest education level: Not on file  Occupational History   Not on file  Tobacco Use   Smoking status: Never   Smokeless tobacco: Never  Vaping Use   Vaping Use: Never used  Substance and Sexual Activity   Alcohol use: No   Drug  use: No   Sexual activity: Not Currently  Other Topics Concern   Not on file  Social History Narrative   Lives in [place], from [place].       Work - Careers adviser Determinants of Corporate investment banker Strain: Not on file  Food Insecurity: Not on file  Transportation Needs: Not on file  Physical Activity: Not on file  Stress: Not on file  Social Connections: Not on file   Family History  Problem Relation Age of Onset   Cancer Mother        ovarian/uterus   Hyperlipidemia Father    Heart disease Father    Hypertension Father    Cancer Father        throat   Hypertension Sister    Hypertension Sister    Hypertension Sister    Liver disease Sister    Heart disease Brother    Hypertension Brother    Stroke Maternal Grandmother    Hypertension Maternal Grandmother    Hypertension Maternal Grandfather    Pancreatic cancer Maternal Grandfather    Stroke Paternal Grandmother    Stroke Paternal Grandfather    Hypertension Other    Heart disease Other    Sudden death Other    Diabetes Other    Review of Systems  Constitutional: Negative.   Respiratory: Negative.    Cardiovascular: Negative.   Gastrointestinal: Negative.   Genitourinary: Negative.   Musculoskeletal:  Positive for myalgias. Negative for arthralgias, back pain, gait problem, joint swelling, neck pain and neck stiffness.  Skin: Negative.   Neurological: Negative.   Psychiatric/Behavioral: Negative.     Per HPI unless specifically indicated above     Objective:    BP 131/70   Pulse 61   Temp 98.2 F (36.8 C) (Oral)   Ht 5' 7.7" (1.72 m)   Wt 233 lb 9.6 oz (106 kg)   SpO2 98%   BMI 35.83 kg/m   Wt Readings from Last 3 Encounters:  08/21/20 233 lb 9.6 oz (106 kg)  01/09/20 230 lb (104.3 kg)  10/17/19 230 lb (104.3 kg)    Physical Exam Vitals and nursing note reviewed.  Constitutional:      General: She is not in acute distress.    Appearance: Normal appearance. She is  not ill-appearing, toxic-appearing or diaphoretic.  HENT:     Head: Normocephalic and atraumatic.  Right Ear: External ear normal.     Left Ear: External ear normal.     Nose: Nose normal.     Mouth/Throat:     Mouth: Mucous membranes are moist.     Pharynx: Oropharynx is clear.  Eyes:     General: No scleral icterus.       Right eye: No discharge.        Left eye: No discharge.     Extraocular Movements: Extraocular movements intact.     Conjunctiva/sclera: Conjunctivae normal.     Pupils: Pupils are equal, round, and reactive to light.  Cardiovascular:     Rate and Rhythm: Normal rate and regular rhythm.     Pulses: Normal pulses.     Heart sounds: Normal heart sounds. No murmur heard.   No friction rub. No gallop.  Pulmonary:     Effort: Pulmonary effort is normal. No respiratory distress.     Breath sounds: Normal breath sounds. No stridor. No wheezing, rhonchi or rales.  Chest:     Chest wall: No tenderness.  Musculoskeletal:        General: Normal range of motion.     Cervical back: Normal range of motion and neck supple.  Skin:    General: Skin is warm and dry.     Capillary Refill: Capillary refill takes less than 2 seconds.     Coloration: Skin is not jaundiced or pale.     Findings: No bruising, erythema, lesion or rash.  Neurological:     General: No focal deficit present.     Mental Status: She is alert and oriented to person, place, and time. Mental status is at baseline.  Psychiatric:        Mood and Affect: Mood normal.        Behavior: Behavior normal.        Thought Content: Thought content normal.        Judgment: Judgment normal.    Results for orders placed or performed in visit on 12/12/19  Uric acid  Result Value Ref Range   Uric Acid, Serum 7.1 (H) 2.5 - 7.0 mg/dL      Assessment & Plan:   Problem List Items Addressed This Visit       Cardiovascular and Mediastinum   Hypertension (Chronic)    Under good control on current regimen.  Continue current regimen. Continue to monitor. Call with any concerns. Refills given. BMP checked in June and normal.        Relevant Medications   triamterene-hydrochlorothiazide (MAXZIDE) 75-50 MG tablet   metoprolol tartrate (LOPRESSOR) 100 MG tablet     Endocrine   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)    Follows with endocrinology. Last A1c in June was doing OK at 7.6. Would like us to manage diabetes. Will refer to pharmacy for help- was on rybelsus and farxiga previously, but now not covered. Goal would be to get her on lower dose of insulin and off glymiperide. She would like to lose weight as well, and other medicines would help with this. Ozempic ordered today. Recheck 1 month.       Relevant Medications   glimepiride (AMARYL) 2 MG tablet   nortriptyline (PAMELOR) 10 MG capsule   Semaglutide,0.25 or 0.5MG /DOS, (OZEMPIC, 0.25 OR 0.5 MG/DOSE,) 2 MG/1.5ML SOPN   Other Relevant Orders   AMB Referral to Community Care Coordinaton   Hypothyroidism    Follows with endocrinology. Stable on levothyroxine. Continue current regimen. Continue to  monitor. Call with any concerns.       Relevant Medications   levothyroxine (SYNTHROID) 125 MCG tablet   metoprolol tartrate (LOPRESSOR) 100 MG tablet   Neuropathy in diabetes [facility])    Not on any medication. Very loopy on gabapentin. Will try nortriptyline and recheck 1 month. Call with any concerns.       Relevant Medications   glimepiride (AMARYL) 2 MG tablet   Semaglutide,0.25 or 0.5MG /DOS, (OZEMPIC, 0.25 OR 0.5 MG/DOSE,) 2 MG/1.5ML SOPN     Other   Hyperlipidemia with target low density lipoprotein (LDL) cholesterol less than 70 mg/dL    Under good control on current regimen. Continue current regimen. Continue to monitor. Call with any concerns. Refills given.        Relevant Medications   triamterene-hydrochlorothiazide (MAXZIDE) 75-50 MG tablet   metoprolol tartrate (LOPRESSOR) 100 MG tablet   Gout     Stable on allopurinol. Will recheck labs next visit. Call with any concerns.         Follow up plan: No follow-ups on file.

## 2020-08-21 NOTE — Assessment & Plan Note (Signed)
Under good control on current regimen. Continue current regimen. Continue to monitor. Call with any concerns. Refills given. BMP checked in June and normal.

## 2020-08-21 NOTE — Assessment & Plan Note (Signed)
Under good control on current regimen. Continue current regimen. Continue to monitor. Call with any concerns. Refills given.   

## 2020-08-21 NOTE — Telephone Encounter (Signed)
Please confirm directions.

## 2020-08-21 NOTE — Assessment & Plan Note (Signed)
Stable on allopurinol. Will recheck labs next visit. Call with any concerns.

## 2020-08-21 NOTE — Assessment & Plan Note (Addendum)
Follows with endocrinology. Last A1c in June was doing OK at 7.6. Would like [place] to manage diabetes. Will refer to pharmacy for help- was on rybelsus and farxiga previously, but now not covered. Goal would be to get her on lower dose of insulin and off glymiperide. She would like to lose weight as well, and other medicines would help with this. Ozempic ordered today. Recheck 1 month.

## 2020-08-21 NOTE — Telephone Encounter (Signed)
Copied from CRM [id number]. Topic: General - Other >> Aug 21, 2020 12:18 PM Gaetana Michaelis A wrote: Reason for CRM: Thurston Hole with [facility] would like clarification on the directions for patient's Semaglutide,0.25 or 0.5MG /DOS, (OZEMPIC, 0.25 OR 0.5 MG/DOSE,) 2 MG/1.5ML SOPN [[id number]] prescriptions  Thurston Hole would like verification of: "Sig: Inject 0.25 mg into the skin once a week for 4 days, THEN 0.5 mg once a week for 4 days."  Please contact further when possible

## 2020-08-21 NOTE — Assessment & Plan Note (Signed)
Follows with endocrinology. Stable on levothyroxine. Continue current regimen. Continue to monitor. Call with any concerns.

## 2020-08-22 NOTE — Telephone Encounter (Signed)
Pharmacy notified.

## 2020-08-22 NOTE — Telephone Encounter (Signed)
1x a week for 4 weeks not 4 days. Thanks!

## 2020-08-23 ENCOUNTER — Telehealth: Payer: Self-pay | Admitting: Family Medicine

## 2020-08-23 NOTE — Telephone Encounter (Signed)
Please let her know that we will wait to see what the pharmacist recommends

## 2020-08-23 NOTE — Telephone Encounter (Signed)
Patient was notified of recommendations per Dr.Johnson. Patient verbalized understanding and has no further questions at this time.

## 2020-08-23 NOTE — Telephone Encounter (Signed)
Routing to Dr. Laural Benes to advise on medication regimen.

## 2020-08-23 NOTE — Telephone Encounter (Signed)
Pt wants Dr Laural Benes to know Ozempic is NOT covered by her insurance. Do you want to try something else, or stay on current regimen?

## 2020-08-24 ENCOUNTER — Encounter: Payer: Self-pay | Admitting: Family Medicine

## 2020-09-02 ENCOUNTER — Telehealth: Payer: Self-pay

## 2020-09-02 ENCOUNTER — Telehealth: Payer: HMO

## 2020-09-02 NOTE — Telephone Encounter (Signed)
  Chronic Care Management  Outreach Note   Name: CANNON QUINTON MRN: [id number] DOB: 1955-11-16  Referred by: Dorcas Carrow, DO Reason for referral: Telephone Appointment with Clinical Pharmacist, Dahlia Byes.   An unsuccessful telephone outreach was attempted today. The patient was referred to the pharmacist for assistance with care management and care coordination.   Telephone appointment with clinical pharmacist today (09/02/2020) at 230pm. If patient immediately returns call, transfer to [phone] or [phone].   Dahlia Byes, PharmD, CPP Clinical Pharmacist Practitioner  [facility]  [phone]

## 2020-09-09 ENCOUNTER — Other Ambulatory Visit: Payer: Self-pay | Admitting: Physician Assistant

## 2020-09-10 NOTE — Progress Notes (Signed)
Patient states to excuse her she had missed her appointment due to her WIFI in he office not working, she states she did not receive the voicemail until days later after her appointment. She was willing to reschedule for 09/23/20 @11am  via telephone.

## 2020-09-27 NOTE — Progress Notes (Signed)
Chronic Care Management Pharmacy Note  09/30/2020 Name:  Christie Bryant MRN:  [id number] DOB:  [birth year]  Summary: DM patient assistance - will prepare Trulicity patient assistance due to higher income cut off. Temporary employment may interfere with eligibility but long term would likely be eligible  Requesting updated uric acid today (has 340pm appt) and possibly consider d/c allopurinol 50 mg daily. Last acid 7.1 nine months ago. Reviewed low purine diet, provided handout. Requesting DEXA (never done) Mammogram (last 09/21/20)  Will review CGM through [facility] to help track high BG throughout day  Subjective: Christie Bryant is an 65 y.o. year old female who is a primary patient of Valerie Roys, DO.  The CCM team was consulted for assistance with disease management and care coordination needs.    Engaged with patient by telephone for initial visit in response to provider referral for pharmacy case management and/or care coordination services.   Consent to Services:  The patient was given the following information about Chronic Care Management services today, agreed to services, and gave verbal consent: 1. CCM service includes personalized support from designated clinical staff supervised by the primary care provider, including individualized plan of care and coordination with other care providers 2. 24/7 contact phone numbers for assistance for urgent and routine care needs. 3. Service will only be billed when office clinical staff spend 20 minutes or more in a month to coordinate care. 4. Only one practitioner may furnish and bill the service in a calendar month. 5.The patient may stop CCM services at any time (effective at the end of the month) by phone call to the office staff. 6. The patient will be responsible for cost sharing (co-pay) of up to 20% of the service fee (after annual deductible is met). Patient agreed to services and consent obtained.  Patient Care Team: Valerie Roys, DO as  PCP - General (Family Medicine) Christene Lye, MD (General Surgery) Jackolyn Confer, MD (Internal Medicine) Judi Cong, MD as Physician Assistant (Endocrinology) [name], [facility] (Pharmacist)  Hospital visits: None in previous 6 months  Objective:  Lab Results  Component Value Date   CREATININE 1.1 07/01/2020   CREATININE 0.85 10/09/2019   CREATININE 0.85 10/08/2019    Lab Results  Component Value Date   HGBA1C 7.6 07/01/2020   HGBA1C 7.7 (H) 10/07/2019   HGBA1C 9.4 (A) 01/22/2014  UACR 31.4 on 07/01/20  Last diabetic Eye exam:  Lab Results  Component Value Date/Time   HMDIABEYEEXA No Retinopathy 11/20/2012 12:00 AM    Last diabetic Foot exam:  Lab Results  Component Value Date/Time   HMDIABFOOTEX Normal 11/20/2013 12:00 AM        Component Value Date/Time   CHOL 199 11/20/2013 0838   CHOL 147 03/16/2013 0801   CHOL 203 (H) 12/05/2012 0933   TRIG 151.0 (H) 11/20/2013 0838   TRIG 82.0 03/16/2013 0801   TRIG 131.0 12/05/2012 0933   HDL 37.20 (L) 11/20/2013 0838   HDL 42.30 03/16/2013 0801   HDL 47.10 12/05/2012 0933   CHOLHDL 5 11/20/2013 0838   VLDL 30.2 11/20/2013 0838   LDLCALC 132 (H) 11/20/2013 0838   LDLCALC 88 03/16/2013 0801   LDLDIRECT 142.6 12/05/2012 0933    Hepatic Function Latest Ref Rng & Units 10/06/2019 11/20/2013 03/16/2013  Total Protein 6.5 - 8.1 g/dL 7.7 7.5 7.6  Albumin 3.5 - 5.0 g/dL 3.8 3.5 3.4(L)  AST 15 - 41 U/L _0 ALT 0 - 44  U/L _0 Alk Phosphatase 38 - 126 U/L 62 64 74  Total Bilirubin 0.3 - 1.2 mg/dL 1.5(H) 1.0 1.4(H)    Lab Results  Component Value Date/Time   TSH 6.71 (H) 03/16/2013 08:01 AM   TSH 6.96 (H) 01/16/2013 08:30 AM   FREET4 0.95 01/16/2013 08:30 AM    CBC Latest Ref Rng & Units 10/09/2019 10/08/2019 10/06/2019  WBC 4.0 - 10.5 K/uL 7.5 10.3 12.3(H)  Hemoglobin 12.0 - 15.0 g/dL 10.9(L) 10.6(L) 13.0  Hematocrit 36.0 - 46.0 % 34.6(L) 32.9(L) 41.0  Platelets 150 - 400 K/uL 162 158 190     No results found for: VD25OH  Clinical ASCVD:  The ASCVD Risk score (Arnett DK, et al., 2019) failed to calculate for the following reasons:   Cannot find a previous HDL lab   Cannot find a previous total cholesterol lab   Social History   Tobacco Use  Smoking Status Never  Smokeless Tobacco Never   BP Readings from Last 3 Encounters:  08/21/20 131/70  10/10/19 132/76  09/27/19 136/76   Pulse Readings from Last 3 Encounters:  08/21/20 61  10/10/19 70  09/27/19 69   Wt Readings from Last 3 Encounters:  08/21/20 233 lb 9.6 oz (106 kg)  01/09/20 230 lb (104.3 kg)  10/17/19 230 lb (104.3 kg)   BMI Readings from Last 3 Encounters:  08/21/20 35.83 kg/m  01/09/20 33.00 kg/m  10/17/19 33.00 kg/m    Assessment: Review of patient past medical history, allergies, medications, health status, including review of consultants reports, laboratory and other test data, was performed as part of comprehensive evaluation and provision of chronic care management services.   SDOH:  (Social Determinants of Health) assessments and interventions performed: Yes   CCM Care Plan  No Known Allergies  Medications Reviewed Today     Reviewed by [name], [facility] (Pharmacist) on 09/30/20 at 1101  Med List Status: <None>   Medication Order Taking? Sig Documenting Provider Last Dose Status Informant  acetaminophen (TYLENOL) 325 MG tablet [id number]  Take 2 tablets (650 mg total) by mouth every 6 (six) hours as needed for mild pain (or Fever >/= 101). Terrilee Croak, MD  Active   allopurinol (ZYLOPRIM) 100 MG tablet [id number] Yes Take 1 tablet by mouth twice daily Persons, Bevely Palmer, PA Taking Active            Med Note Leroy Sea, Fatuma Dowers   Mon Sep 30, 2020 11:00 AM) Dewaine Conger half tablet (50 mg) once daily   Cholecalciferol (VITAMIN D-3 PO) [id number] Yes Take 2,000 Units by mouth daily.  [provider]  Active Self  docusate sodium (COLACE) 100 MG capsule [id number]  Take 100 mg by mouth daily.   [provider]  Active Self  glimepiride (AMARYL) 2 MG tablet [id number] No Take 2 mg by mouth every morning.  Patient not taking: Reported on 09/30/2020   [provider] Not Taking Active   Insulin Pen Needle (PEN NEEDLES 3/16") 31G X 5 MM MISC [id number]  1 each by Does not apply route once a week. Johnson, Megan P, DO  Active   levothyroxine (SYNTHROID) 125 MCG tablet [id number] Yes TAKE 1 TABLET BY MOUTH ONCE DAILY ON  AN  EMPTY  STOMACH  WITH  A  GLASS  OF  WATER  AT  LEAST  30  TO  7  MINUTES  BEFORE  BREAKFAST [provider] Taking Active   metFORMIN (GLUCOPHAGE) 1000 MG tablet [id number] Yes Take 1,000 mg  by mouth 2 (two) times daily with a meal. [provider] Taking Active Self  metoprolol tartrate (LOPRESSOR) 100 MG tablet [id number] Yes Take 1 tablet by mouth 2 (two) times daily. [provider] Taking Active   Multiple Vitamin (MULTIVITAMIN) capsule [id number] Yes Take 1 capsule by mouth daily. [provider] Taking Active Self  nortriptyline (PAMELOR) 10 MG capsule [id number] Yes Take 1 capsule (10 mg total) by mouth at bedtime. Johnson, Megan P, DO Taking Active   omeprazole (PRILOSEC) 20 MG capsule [id number]  Take 20 mg by mouth daily. [provider]  Active   pravastatin (PRAVACHOL) 40 MG tablet [id number] Yes TAKE ONE TABLET BY MOUTH ONCE DAILY  Patient taking differently: Take 40 mg by mouth every evening.   Jackolyn Confer, MD Taking Active   TRESIBA FLEXTOUCH 200 UNIT/ML FlexTouch Pen [id number] Yes Inject 60-100 Units into the skin at bedtime.  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   DM type 2 with  diabetic peripheral neuropathy ([place]) 08/03/2018   Hypertension 09/08/2011   Hypothyroidism 09/08/2011   Hyperlipidemia with target low density lipoprotein (LDL) cholesterol less than 70 mg/dL 09/08/2011   Neuropathy in diabetes ([place]) 09/08/2011    Immunization History  Administered Date(s) Administered   Influenza-Unspecified 10/26/2012, 10/26/2013, 09/13/2014   PFIZER(Purple Top)SARS-COV-2 Vaccination 02/27/2019, 03/20/2019, 10/16/2019    Conditions to be addressed/monitored: HLD Hypothyroidism T2DM Gout   Care Plan : ccm pharmacy care plan  Updates made by [name], [place] since 09/30/2020 12:00 AM     Problem: hld htn dmii hypothyroidism gout   Priority: High     Long-Range Goal: disease management   Start Date: 09/30/2020  Expected End Date: 09/30/2021  This Visit's Progress: On track  Priority: High  Note:   Current Barriers:  Unable to achieve control of T2DM   Pharmacist Clinical Goal(s):  Patient will verbalize ability to afford treatment regimen achieve improvement in T2DM as evidenced by blood sugar goals through collaboration with PharmD and provider.   Interventions: 1:1 collaboration with Valerie Roys, DO regarding development and update of comprehensive plan of care as evidenced by provider attestation and co-signature Inter-disciplinary care team collaboration (see longitudinal plan of care) Comprehensive medication review performed; medication list updated in electronic medical record  Hypertension (BP goal <140/90) -Controlled -Current treatment: Triamterene-hydrochlorothiazide 75-50 mg tablet - HALF tablet once daily  Metoprolol tartrate 100 mg twice daily  -Medications previously tried: n/a  -Current home readings: 148/88 (9/18) -Denies hypotensive/hypertensive symptoms -Educated on BP goals and benefits of medications for prevention of heart attack, stroke and kidney damage; Importance of home blood pressure monitoring; Proper BP monitoring  technique; -Counseled to monitor BP at home 1-2x/wk, document, and provide log at future appointments -Recommended to continue current medication Recommend pt purchase new home cuff  Hyperlipidemia: (LDL goal < 100) -Controlled -Current treatment: Pravastatin 40 mg once daily  -Reviewed tolerability/side effects - no problems noted -Educated on Cholesterol goals;  Benefits of statin for ASCVD risk reduction; -Counseled on diet and exercise extensively Recommended to continue current medication  Diabetes (A1c goal <7%) -Not ideally controlled -Current medications: Glimepiride 2 mg once every morning with breakfast (not  taking) Metformin 1000 mg twice daily  Tresiba 60-100 units daily at night  -Medications previously tried: [place] (cost), Ozempic (cost), Wilder Glade (cost), glimepiride, liraglutide -Current home glucose readings September 2022 fasting glucose: 120 this morning  post prandial glucose: 220-280 -Denies hypoglycemic/hyperglycemic symptoms -Current meal patterns: healthy choice  Feels sweets are the main issue. breakfast: typically skips if working. lunch: chicken sandwich  -Current exercise: walking some with dogs in evening.  -Educated on A1c and blood sugar goals; -Counseled to check feet daily and get yearly eye exams -Counseled on diet and exercise extensively Requesting CGM for DM BG tracking   Gout (Goal: minimize symptoms) -Controlled -Current treatment  Allopurinol 50 mg once daily -Medications previously tried: n/a -Reviewed low purine diet, handout provided  -Counseled on diet and exercise extensively Pt requesting updated uric acid   Patient Goals/Self-Care Activities Patient will:  - take medications as prescribed collaborate with provider on medication access solutions  Medication Assistance: Application for Trulicity  medication assistance program. in process.  Anticipated assistance start date 10/2020 if approved.  See plan of care for  additional detail.  Patient's preferred pharmacy is:  [facility] [address], [place] - [place] [address] Phone: [phone] Fax: [phone]  Follow Up:  Patient agrees to Care Plan and Follow-up. Plan: HC 1 month DM. CPP 3 month telephone f/u      Future Appointments  Date Time Provider [place]  09/30/2020  3:40 PM Valerie Roys, DO [facility]FP-[facility]FP [facility]  12/23/2020  1:00 PM CFP CCM PHARMACY [facility]FP[facility]    [name], PharmD, CPP Clinical Pharmacist Practitioner  [place]  [phone]

## 2020-09-30 ENCOUNTER — Other Ambulatory Visit: Payer: Self-pay

## 2020-09-30 ENCOUNTER — Ambulatory Visit (INDEPENDENT_AMBULATORY_CARE_PROVIDER_SITE_OTHER): Payer: HMO

## 2020-09-30 ENCOUNTER — Ambulatory Visit (INDEPENDENT_AMBULATORY_CARE_PROVIDER_SITE_OTHER): Payer: HMO | Admitting: Family Medicine

## 2020-09-30 ENCOUNTER — Encounter: Payer: Self-pay | Admitting: Family Medicine

## 2020-09-30 VITALS — BP 137/80 | HR 67 | Temp 96.7°F | Resp 16 | Ht 70.0 in | Wt 233.2 lb

## 2020-09-30 DIAGNOSIS — E1142 Type 2 diabetes mellitus with diabetic polyneuropathy: Secondary | ICD-10-CM | POA: Diagnosis not present

## 2020-09-30 DIAGNOSIS — M1A9XX0 Chronic gout, unspecified, without tophus (tophi): Secondary | ICD-10-CM

## 2020-09-30 DIAGNOSIS — Z1159 Encounter for screening for other viral diseases: Secondary | ICD-10-CM | POA: Diagnosis not present

## 2020-09-30 DIAGNOSIS — Z1231 Encounter for screening mammogram for malignant neoplasm of breast: Secondary | ICD-10-CM | POA: Diagnosis not present

## 2020-09-30 DIAGNOSIS — Z23 Encounter for immunization: Secondary | ICD-10-CM

## 2020-09-30 DIAGNOSIS — Z13820 Encounter for screening for osteoporosis: Secondary | ICD-10-CM | POA: Diagnosis not present

## 2020-09-30 DIAGNOSIS — I10 Essential (primary) hypertension: Secondary | ICD-10-CM

## 2020-09-30 LAB — MICROALBUMIN, URINE WAIVED
Creatinine, Urine Waived: 100 mg/dL (ref 10–300)
Microalb, Ur Waived: 30 mg/L — ABNORMAL HIGH (ref 0–19)
Microalb/Creat Ratio: <30 mg/g (ref <30)

## 2020-09-30 LAB — BAYER DCA HB A1C WAIVED: HB A1C (BAYER DCA - WAIVED): 7.4 % — ABNORMAL HIGH (ref 4.8–5.6)

## 2020-09-30 NOTE — Assessment & Plan Note (Signed)
Doing well with the nortriptyline, but headaches acting up again. Will increase her dose to 20mg  for the next week and see how she does- if tolerates it, will increase her to 25mg .

## 2020-09-30 NOTE — Progress Notes (Signed)
BP 137/80 (BP Location: Left Arm, Patient Position: Sitting, Cuff Size: Large)   Pulse 67   Temp (!) 96.7 F (35.9 C) (Oral)   Resp 16   Ht 5\' 10"  (1.778 m)   Wt 233 lb 3.2 oz (105.8 kg)   SpO2 97%   BMI 33.46 kg/m    Subjective:    Patient ID: , female    DOB: 04-06-1955, 65 y.o.   MRN: [id number]  HPI: Christie Bryant is a 65 y.o. female  Chief Complaint  Patient presents with   Diabetes   DIABETES Hypoglycemic episodes:no Polydipsia/polyuria: no Visual disturbance: no Chest pain: no Paresthesias: no Glucose Monitoring: yes  Accucheck frequency: BID  Fasting glucose: 88-130  Evening: 200-260 Taking Insulin?: yes Blood Pressure Monitoring: not checking Retinal Examination: Up to Date Foot Exam:  Done today Diabetic Education: Completed Pneumovax: Not up to Date Influenza:  Administered today Aspirin: yes  No gout flares. Would like to come off her allopurinol.   Feels like the nortriptyline has been doing well. Not groggy in the AM. Headaches went away, but then seemed to come back.   Relevant past medical, surgical, family and social history reviewed and updated as indicated. Interim medical history since our last visit reviewed. Allergies and medications reviewed and updated.  Review of Systems  Constitutional: Negative.   Respiratory: Negative.    Cardiovascular: Negative.   Gastrointestinal: Negative.   Genitourinary: Negative.   Musculoskeletal: Negative.   Neurological: Negative.   Psychiatric/Behavioral: Negative.     Per HPI unless specifically indicated above     Objective:    BP 137/80 (BP Location: Left Arm, Patient Position: Sitting, Cuff Size: Large)   Pulse 67   Temp (!) 96.7 F (35.9 C) (Oral)   Resp 16   Ht 5\' 10"  (1.778 m)   Wt 233 lb 3.2 oz (105.8 kg)   SpO2 97%   BMI 33.46 kg/m   Wt Readings from Last 3 Encounters:  09/30/20 233 lb 3.2 oz (105.8 kg)  08/21/20 233 lb 9.6 oz (106 kg)  01/09/20 230 lb (104.3 kg)     Physical Exam Vitals and nursing note reviewed.  Constitutional:      General: She is not in acute distress.    Appearance: Normal appearance. She is not ill-appearing, toxic-appearing or diaphoretic.  HENT:     Head: Normocephalic and atraumatic.     Right Ear: External ear normal.     Left Ear: External ear normal.     Nose: Nose normal.     Mouth/Throat:     Mouth: Mucous membranes are moist.     Pharynx: Oropharynx is clear.  Eyes:     General: No scleral icterus.       Right eye: No discharge.        Left eye: No discharge.     Extraocular Movements: Extraocular movements intact.     Conjunctiva/sclera: Conjunctivae normal.     Pupils: Pupils are equal, round, and reactive to light.  Cardiovascular:     Rate and Rhythm: Normal rate and regular rhythm.     Pulses: Normal pulses.     Heart sounds: Normal heart sounds. No murmur heard.   No friction rub. No gallop.  Pulmonary:     Effort: Pulmonary effort is normal. No respiratory distress.     Breath sounds: Normal breath sounds. No stridor. No wheezing, rhonchi or rales.  Chest:     Chest wall: No tenderness.  Musculoskeletal:  General: Normal range of motion.     Cervical back: Normal range of motion and neck supple.  Skin:    General: Skin is warm and dry.     Capillary Refill: Capillary refill takes less than 2 seconds.     Coloration: Skin is not jaundiced or pale.     Findings: No bruising, erythema, lesion or rash.  Neurological:     General: No focal deficit present.     Mental Status: She is alert and oriented to person, place, and time. Mental status is at baseline.  Psychiatric:        Mood and Affect: Mood normal.        Behavior: Behavior normal.        Thought Content: Thought content normal.        Judgment: Judgment normal.    Results for orders placed or performed in visit on 08/21/20  HM MAMMOGRAPHY  Result Value Ref Range   HM Mammogram 0-4 Bi-Rad 0-4 Bi-Rad, Self Reported Normal   Basic metabolic panel  Result Value Ref Range   Glucose 141    BUN 14 4 - 21   CO2 26 (A) 13 - 22   Creatinine 1.1 0.5 - 1.1   Potassium 4.4 3.4 - 5.3   Sodium 139 137 - 147   Chloride 106 99 - 108  Comprehensive metabolic panel  Result Value Ref Range   GFR calc non Af Amer 50    Calcium 9.6 8.7 - 10.7  Hemoglobin A1c  Result Value Ref Range   Hemoglobin A1C 7.6       Assessment & Plan:   Problem List Items Addressed This Visit       Endocrine   DM type 2 with diabetic peripheral neuropathy (HCC) - Primary (Chronic)    A1c slightly improved at 7.4- down from 7.6. Working with pharmacy to try to get her on trulicity. Await their input. Call with any concerns. Recheck in 1 month if started on trulicity or 3 months without.       Relevant Orders   Bayer DCA Hb A1c Waived   Microalbumin, Urine Waived   Neuropathy in diabetes [facility])    Doing well with the nortriptyline, but headaches acting up again. Will increase her dose to 20mg  for the next week and see how she does- if tolerates it, will increase her to 25mg .          Other   Gout    Checking uric acid today with goal of coming off meds. Await results.       Relevant Orders   Uric acid   Other Visit Diagnoses     Encounter for screening mammogram for malignant neoplasm of breast       Mammo ordered today.   Relevant Orders   MM 3D SCREEN BREAST BILATERAL   Screening for osteoporosis       DEXA ordered today.   Relevant Orders   DG Bone Density   Needs flu shot       Flu shot given today.   Relevant Orders   Flu Vaccine QUAD High Dose(Fluad) (Completed)   Need for hepatitis C screening test       Labs drawn today. Await results.    Relevant Orders   Hepatitis C Antibody        Follow up plan: Return in about 3 months (around 12/30/2020).

## 2020-09-30 NOTE — Patient Instructions (Addendum)
Christie Bryant,  Thank you for talking with me today. I have included our care plan/goals in the following pages.   Please review and call me at [phone] with any questions.  Thanks! Christie Bryant, PharmD, CPP Clinical Pharmacist Practitioner  [phone]  Care Plan : ccm pharmacy care plan  Updates made by Christie Bryant, [facility] since 09/30/2020 12:00 AM     Problem: hld htn dmii hypothyroidism gout   Priority: High     Long-Range Goal: disease management   Start Date: 09/30/2020  Expected End Date: 09/30/2021  This Visit's Progress: On track  Priority: High  Note:   Current Barriers:  Unable to achieve control of T2DM   Pharmacist Clinical Goal(s):  Patient will verbalize ability to afford treatment regimen achieve improvement in T2DM as evidenced by blood sugar goals through collaboration with PharmD and provider.   Interventions: 1:1 collaboration with Valerie Roys, DO regarding development and update of comprehensive plan of care as evidenced by provider attestation and co-signature Inter-disciplinary care team collaboration (see longitudinal plan of care) Comprehensive medication review performed; medication list updated in electronic medical record  Hypertension (BP goal <140/90) -Controlled -Current treatment: Triamterene-hydrochlorothiazide 75-50 mg tablet - HALF tablet once daily  Metoprolol tartrate 100 mg twice daily  -Medications previously tried: n/a  -Current home readings: 148/88 (9/18) -Denies hypotensive/hypertensive symptoms -Educated on BP goals and benefits of medications for prevention of heart attack, stroke and kidney damage; Importance of home blood pressure monitoring; Proper BP monitoring technique; -Counseled to monitor BP at home 1-2x/wk, document, and provide log at future appointments -Recommended to continue current medication Recommend pt purchase new home cuff  Hyperlipidemia: (LDL goal < 100) -Controlled -Current  treatment: Pravastatin 40 mg once daily  -Reviewed tolerability/side effects - no problems noted -Educated on Cholesterol goals;  Benefits of statin for ASCVD risk reduction; -Counseled on diet and exercise extensively Recommended to continue current medication  Diabetes (A1c goal <7%) -Not ideally controlled -Current medications: Glimepiride 2 mg once every morning with breakfast (not taking) Metformin 1000 mg twice daily  Tresiba 60-100 units daily at night  -Medications previously tried: [place] (cost), Ozempic (cost), Wilder Glade (cost), glimepiride, liraglutide -Current home glucose readings September 2022 fasting glucose: 120 this morning  post prandial glucose: 220-280 -Denies hypoglycemic/hyperglycemic symptoms -Current meal patterns: healthy choice  Feels sweets are the main issue. breakfast: typically skips if working. lunch: chicken sandwich  -Current exercise: walking some with dogs in evening.  -Educated on A1c and blood sugar goals; -Counseled to check feet daily and get yearly eye exams -Counseled on diet and exercise extensively Requesting CGM for DM BG tracking   Gout (Goal: minimize symptoms) -Controlled -Current treatment  Allopurinol 50 mg once daily -Medications previously tried: n/a -Reviewed low purine diet, handout provided  -Counseled on diet and exercise extensively Pt requesting updated uric acid   Patient Goals/Self-Care Activities Patient will:  - take medications as prescribed collaborate with provider on medication access solutions  Medication Assistance: Application for Trulicity  medication assistance program. in process.  Anticipated assistance start date 10/2020 if approved.  See plan of care for additional detail.  Patient's preferred pharmacy is:  [facility] [address] [place] - [place] [address] Phone: [phone] Fax: [phone]  Follow Up:  Patient agrees to Care Plan and  Follow-up. Plan: HC 1 month DM. CPP 3 month telephone f/u     The patient was given the following information about  Chronic Care Management services today, agreed to services, and gave verbal consent: 1. CCM service includes personalized support from designated clinical staff supervised by the primary care provider, including individualized plan of care and coordination with other care providers 2. 24/7 contact phone numbers for assistance for urgent and routine care needs. 3. Service will only be billed when office clinical staff spend 20 minutes or more in a month to coordinate care. 4. Only one practitioner may furnish and bill the service in a calendar month. 5.The patient may stop CCM services at any time (effective at the end of the month) by phone call to the office staff. 6. The patient will be responsible for cost sharing (co-pay) of up to 20% of the service fee (after annual deductible is met). Patient agreed to services and consent obtained.  The patient verbalized understanding of instructions provided today and agreed to receive a MyChart copy of patient instruction and/or educational materials. Telephone follow up appointment with pharmacy team member scheduled for: See next appointment with "Care Management Staff" under "What's Next" below.

## 2020-09-30 NOTE — Assessment & Plan Note (Signed)
Checking uric acid today with goal of coming off meds. Await results.

## 2020-09-30 NOTE — Assessment & Plan Note (Signed)
A1c slightly improved at 7.4- down from 7.6. Working with pharmacy to try to get her on trulicity. Await their input. Call with any concerns. Recheck in 1 month if started on trulicity or 3 months without.

## 2020-10-01 ENCOUNTER — Other Ambulatory Visit: Payer: Self-pay | Admitting: Family Medicine

## 2020-10-01 LAB — HEPATITIS C ANTIBODY: Hep C Virus Ab: <0.1 s/co ratio (ref 0.0–0.9)

## 2020-10-01 LAB — URIC ACID: Uric Acid: 7 mg/dL (ref 3.0–7.2)

## 2020-10-11 DIAGNOSIS — E1142 Type 2 diabetes mellitus with diabetic polyneuropathy: Secondary | ICD-10-CM | POA: Diagnosis not present

## 2020-10-11 DIAGNOSIS — I10 Essential (primary) hypertension: Secondary | ICD-10-CM | POA: Diagnosis not present

## 2020-10-18 ENCOUNTER — Encounter: Payer: Self-pay | Admitting: Family Medicine

## 2020-10-18 DIAGNOSIS — M85852 Other specified disorders of bone density and structure, left thigh: Secondary | ICD-10-CM | POA: Diagnosis not present

## 2020-10-18 DIAGNOSIS — Z1231 Encounter for screening mammogram for malignant neoplasm of breast: Secondary | ICD-10-CM | POA: Diagnosis not present

## 2020-10-18 DIAGNOSIS — Z13820 Encounter for screening for osteoporosis: Secondary | ICD-10-CM | POA: Diagnosis not present

## 2020-10-18 LAB — HM MAMMOGRAPHY

## 2020-10-18 LAB — HM DEXA SCAN

## 2020-10-23 ENCOUNTER — Other Ambulatory Visit: Payer: Self-pay | Admitting: Family Medicine

## 2020-10-23 MED ORDER — RYBELSUS 3 MG PO TABS: 3.0000 mg | ORAL_TABLET | Freq: Every day | ORAL | 0 refills | Status: DC

## 2020-10-29 ENCOUNTER — Encounter: Payer: Self-pay | Admitting: Orthopedic Surgery

## 2020-10-31 ENCOUNTER — Encounter: Payer: Self-pay | Admitting: Family Medicine

## 2020-10-31 ENCOUNTER — Telehealth: Payer: Self-pay | Admitting: Family Medicine

## 2020-10-31 NOTE — Telephone Encounter (Signed)
Patient aware of sample. Will pick up today at 1pm. LOT: [id number] Expire: 10/2021

## 2020-11-13 DIAGNOSIS — I10 Essential (primary) hypertension: Secondary | ICD-10-CM | POA: Diagnosis not present

## 2020-11-13 DIAGNOSIS — E1122 Type 2 diabetes mellitus with diabetic chronic kidney disease: Secondary | ICD-10-CM | POA: Diagnosis not present

## 2020-11-13 DIAGNOSIS — N183 Chronic kidney disease, stage 3 unspecified: Secondary | ICD-10-CM | POA: Diagnosis not present

## 2020-11-13 DIAGNOSIS — Z794 Long term (current) use of insulin: Secondary | ICD-10-CM | POA: Diagnosis not present

## 2020-11-13 DIAGNOSIS — E039 Hypothyroidism, unspecified: Secondary | ICD-10-CM | POA: Diagnosis not present

## 2020-11-22 ENCOUNTER — Other Ambulatory Visit: Payer: Self-pay | Admitting: Family Medicine

## 2020-11-23 ENCOUNTER — Other Ambulatory Visit: Payer: Self-pay | Admitting: Family Medicine

## 2020-11-23 NOTE — Telephone Encounter (Signed)
Requested Prescriptions  Pending Prescriptions Disp Refills  . nortriptyline (PAMELOR) 10 MG capsule [Pharmacy Med Name: Nortriptyline HCl 10 MG Oral Capsule] 90 capsule 0    Sig: Take 1 capsule by mouth at bedtime     Psychiatry:  Antidepressants - Heterocyclics (TCAs) Passed - 11/22/2020  9:39 PM      Passed - Valid encounter within last 6 months    Recent Outpatient Visits          1 month ago DM type 2 with diabetic peripheral neuropathy (HCC)   [facility] Johnson, Megan P, DO   3 months ago DM type 2 with diabetic peripheral neuropathy [facility]   [facility] [place], Megan P, DO      Future Appointments            In 1 month Johnson, Oralia Rud, DO [facility]

## 2020-11-24 NOTE — Telephone Encounter (Signed)
[facility] called and spoke to [place], Pensions consultant about the refill(s) Nortriptyline requested. Advised it was sent on 11/23/20 #90/0 refill(s). She says it was received, but is out of stock at the moment, will be in stock for refill tomorrow or Tuesday, patient is aware. Will refuse this request.  Requested Prescriptions  Pending Prescriptions Disp Refills   nortriptyline (PAMELOR) 10 MG capsule [Pharmacy Med Name: Nortriptyline HCl 10 MG Oral Capsule] 30 capsule 0    Sig: Take 1 capsule by mouth at bedtime     Psychiatry:  Antidepressants - Heterocyclics (TCAs) Passed - 11/23/2020  4:17 PM      Passed - Valid encounter within last 6 months    Recent Outpatient Visits           1 month ago DM type 2 with diabetic peripheral neuropathy (HCC)   [facility] [place], Megan P, DO   3 months ago DM type 2 with diabetic peripheral neuropathy [facility])   [facility] [place], Megan P, DO       Future Appointments             In 1 month Johnson, Oralia Rud, DO [facility], [facility]

## 2020-12-03 ENCOUNTER — Ambulatory Visit (INDEPENDENT_AMBULATORY_CARE_PROVIDER_SITE_OTHER): Payer: HMO | Admitting: *Deleted

## 2020-12-03 DIAGNOSIS — Z0000 Encounter for general adult medical examination without abnormal findings: Secondary | ICD-10-CM

## 2020-12-03 NOTE — Progress Notes (Signed)
Subjective:   Christie Bryant is a 65 y.o. female who presents for Medicare Annual (Subsequent) preventive examination.  I connected with  Christie Bryant on 12/03/20 by a telephone enabled telemedicine application and verified that I am speaking with the correct person using two identifiers.   I discussed the limitations of evaluation and management by telemedicine. The patient expressed understanding and agreed to proceed.  Patient location: home  Provider location: Tele-health  not in office     Review of Systems     Cardiac Risk Factors include: advanced age (>63men, >31 women);diabetes mellitus;hypertension;sedentary lifestyle;obesity (BMI >30kg/m2)     Objective:    Today's Vitals   There is no height or weight on file to calculate BMI.  Advanced Directives 12/03/2020 10/09/2019 11/12/2014 08/13/2014  Does Patient Have a Medical Advance Directive? No No No No  Would patient like information on creating a medical advance directive? No - Patient declined No - Patient declined - -    Current Medications (verified) Outpatient Encounter Medications as of 12/03/2020  Medication Sig   acetaminophen (TYLENOL) 325 MG tablet Take 2 tablets (650 mg total) by mouth every 6 (six) hours as needed for mild pain (or Fever >/= 101).   Cholecalciferol (VITAMIN D-3 PO) Take 2,000 Units by mouth daily.    docusate sodium (COLACE) 100 MG capsule Take 100 mg by mouth daily.    Dulaglutide (TRULICITY) 0.75 MG/0.5ML SOPN Inject into the skin. Once a week   Insulin Pen Needle (PEN NEEDLES 3/16") 31G X 5 MM MISC 1 each by Does not apply route once a week.   levothyroxine (SYNTHROID) 125 MCG tablet TAKE 1 TABLET BY MOUTH ONCE DAILY ON  AN  EMPTY  STOMACH  WITH  A  GLASS  OF  WATER  AT  LEAST  30  TO  60  MINUTES  BEFORE  BREAKFAST   metFORMIN (GLUCOPHAGE) 1000 MG tablet Take 1,000 mg by mouth 2 (two) times daily with a meal.   metoprolol tartrate (LOPRESSOR) 100 MG tablet Take 1 tablet by mouth 2 (two)  times daily.   nortriptyline (PAMELOR) 10 MG capsule Take 1 capsule by mouth at bedtime   omeprazole (PRILOSEC) 20 MG capsule Take 20 mg by mouth daily.   pravastatin (PRAVACHOL) 40 MG tablet TAKE ONE TABLET BY MOUTH ONCE DAILY (Patient taking differently: Take 40 mg by mouth every evening.)   TRESIBA FLEXTOUCH 200 UNIT/ML FlexTouch Pen Inject 60-100 Units into the skin at bedtime.    triamterene-hydrochlorothiazide (MAXZIDE) 75-50 MG tablet Take 1/2 (one-half) tablet by mouth once daily   Multiple Vitamin (MULTIVITAMIN) capsule Take 1 capsule by mouth daily. (Patient not taking: Reported on 12/03/2020)   Semaglutide (RYBELSUS) 3 MG TABS Take 3 mg by mouth daily. (Patient not taking: Reported on 12/03/2020)   No facility-administered encounter medications on file as of 12/03/2020.    Allergies (verified) Patient has no known allergies.   History: Past Medical History:  Diagnosis Date   Chicken pox    Chronic kidney disease    Stones   Diabetes mellitus 2012   Dr. Tedd Sias   Hyperlipidemia    Hypertension    Neuromuscular disorder (HCC)    Recurrent boils    Thyroid disease    Past Surgical History:  Procedure Laterality Date   ABDOMINAL HYSTERECTOMY  01/13/1996   complete   BIOPSY THYROID     normal, Dr. Tedd Sias   BREAST REDUCTION SURGERY     COLONOSCOPY WITH PROPOFOL N/A 08/13/2014  Procedure: COLONOSCOPY WITH PROPOFOL;  Surgeon: Wallace Cullens, MD;  Location: [facility] END[facility]COPY;  Service: Gastroenterology;  Laterality: N/A;   COLONOSCOPY WITH PROPOFOL N/A 11/12/2014   Procedure: COLONOSCOPY WITH PROPOFOL;  Surgeon: Wallace Cullens, MD;  Location: [facility] ENDOSCOPY;  Service: Gastroenterology;  Laterality: N/A;   COSMETIC SURGERY     gluteal abscess drainage  03/17/2013   TUBAL LIGATION     Family History  Problem Relation Age of Onset   Cancer Mother        ovarian/uterus   Hyperlipidemia Father    Heart disease Father    Hypertension Father    Cancer Father        throat    Hypertension Sister    Hypertension Sister    Hypertension Sister    Liver disease Sister    Heart disease Brother    Hypertension Brother    Stroke Maternal Grandmother    Hypertension Maternal Grandmother    Hypertension Maternal Grandfather    Pancreatic cancer Maternal Grandfather    Stroke Paternal Grandmother    Stroke Paternal Grandfather    Hypertension Other    Heart disease Other    Sudden death Other    Diabetes Other    Social History   Socioeconomic History   Marital status: Married    Spouse name: Not on file   Number of children: Not on file   Years of education: Not on file   Highest education level: Not on file  Occupational History   Not on file  Tobacco Use   Smoking status: Never   Smokeless tobacco: Never  Vaping Use   Vaping Use: Never used  Substance and Sexual Activity   Alcohol use: No   Drug use: No   Sexual activity: Not Currently  Other Topics Concern   Not on file  Social History Narrative   Lives in [place], from [place].       Work - Dispensing optician   Social Determinants of Corporate investment banker Strain: Low Risk    Difficulty of Paying Living Expenses: Not hard at all  Food Insecurity: No Food Insecurity   Worried About Programme researcher, broadcasting/film/video in the Last Year: Never true   Barista in the Last Year: Never true  Transportation Needs: No Transportation Needs   Lack of Transportation (Medical): No   Lack of Transportation (Non-Medical): No  Physical Activity: Inactive   Days of Exercise per Week: 0 days   Minutes of Exercise per Session: 0 min  Stress: No Stress Concern Present   Feeling of Stress : Not at all  Social Connections: Moderately Integrated   Frequency of Communication with Friends and Family: More than three times a week   Frequency of Social Gatherings with Friends and Family: Once a week   Attends Religious Services: More than 4 times per year   Active Member of [employer] or Organizations: No   Attends  Engineer, structural: Never   Marital Status: Married    Tobacco Counseling Counseling given: Not Answered   Clinical Intake:  Pre-visit preparation completed: No  Pain : No/denies pain     Diabetes: No  How often do you need to have someone help you when you read instructions, pamphlets, or other written materials from your doctor or pharmacy?: 1 - Never  Diabetic?  Yes  Nutrition Risk Assessment:  Has the patient had any N/V/D within the last 2 months?  No  Does the patient  have any non-healing wounds?  No  Has the patient had any unintentional weight loss or weight gain?  No   Diabetes:  Is the patient diabetic?  Yes  If diabetic, was a CBG obtained today?  No  Did the patient bring in their glucometer from home?  No  How often do you monitor your CBG's? 2 x daily.   Financial Strains and Diabetes Management:  Are you having any financial strains with the device, your supplies or your medication? No .  Does the patient want to be seen by Chronic Care Management for management of their diabetes?  No  Would the patient like to be referred to a Nutritionist or for Diabetic Management?  No   Diabetic Exams:  Diabetic Eye Exam:   Appointment scheduled for January, [facility] . Overdue for diabetic eye exam. Pt has been advised about the importance in completing this exam. A referral has been placed today. Message sent to referral coordinator for scheduling purposes. Advised pt to expect a call from office referred to regarding appt.  Diabetic Foot Exam:. Pt has been advised about the importance in completing this exam.   Interpreter Needed?: No  Information entered by :: Remi Haggard LPN   Activities of Daily Living In your present state of health, do you have any difficulty performing the following activities: 12/03/2020 08/21/2020  Hearing? N N  Vision? N N  Difficulty concentrating or making decisions? N N  Walking or climbing stairs? N N   Dressing or bathing? N N  Doing errands, shopping? N N  Preparing Food and eating ? N -  Using the Toilet? N -  In the past six months, have you accidently leaked urine? N -  Do you have problems with loss of bowel control? N -  Managing your Medications? N -  Managing your Finances? N -  Housekeeping or managing your Housekeeping? N -  Some recent data might be hidden    Patient Care Team: Dorcas Carrow, DO as PCP - General (Family Medicine) Kieth Brightly, MD (General Surgery) Shelia Media, MD (Internal Medicine) Tedd Sias Marlana Salvage, MD as Physician Assistant (Endocrinology) Dahlia Byes, [facility] (Pharmacist)  Indicate any recent Medical Services you may have received from other than [facility] providers in the past year (date may be approximate).     Assessment:   This is a routine wellness examination for [employer].  Hearing/Vision screen Hearing Screening - Comments:: No trouble hearing Vision Screening - Comments:: Up to date  appointment scheduled for January [facility]  Dietary issues and exercise activities discussed: Current Exercise Habits: The patient does not participate in regular exercise at present, Exercise limited by: None identified   Goals Addressed             This Visit's Progress    Weight (lb) < 200 lb (90.7 kg)       Loose weight 40lbs       Depression Screen PHQ 2/9 Scores 12/03/2020 08/21/2020 12/05/2012  PHQ - 2 Score 0 0 0    Fall Risk Fall Risk  12/03/2020 08/21/2020  Falls in the past year? 0 0  Number falls in past yr: 0 0  Injury with Fall? 0 0  Risk for fall due to : - No Fall Risks  Follow up Falls prevention discussed;Falls evaluation completed Falls evaluation completed    FALL RISK PREVENTION PERTAINING TO THE HOME:  Any stairs in or around the home? No  If so,  are there any without handrails? No  Home free of loose throw rugs in walkways, pet beds, electrical cords, etc? Yes  Adequate lighting in your home to  reduce risk of falls? Yes   ASSISTIVE DEVICES UTILIZED TO PREVENT FALLS:  Life alert? No  Use of a cane, walker or w/c? No  Grab bars in the bathroom? Yes  Shower chair or bench in shower? Yes  Elevated toilet seat or a handicapped toilet? Yes   TIMED UP AND GO:  Was the test performed? No .    Cognitive Function:  Normal cognitive status assessed by direct observation by this Nurse Health Advisor. No abnormalities found.          Immunizations Immunization History  Administered Date(s) Administered   Fluad Quad(high Dose 65+) 09/30/2020   Influenza-Unspecified 10/26/2012, 10/26/2013, 09/13/2014   PFIZER(Purple Top)SARS-COV-2 Vaccination 02/27/2019, 03/20/2019, 10/16/2019, 08/23/2020   Pneumococcal Conjugate-13 09/30/2020    TDAP status: Due, Education has been provided regarding the importance of this vaccine. Advised may receive this vaccine at local pharmacy or Health Dept. Aware to provide a copy of the vaccination record if obtained from local pharmacy or Health Dept. Verbalized acceptance and understanding.  Flu Vaccine status: Up to date  Pneumococcal vaccine status: Up to date  Covid-19 vaccine status: Completed vaccines  Qualifies for Shingles Vaccine? Yes   Zostavax completed No   Shingrix Completed?: No.    Education has been provided regarding the importance of this vaccine. Patient has been advised to call insurance company to determine out of pocket expense if they have not yet received this vaccine. Advised may also receive vaccine at local pharmacy or Health Dept. Verbalized acceptance and understanding.  Screening Tests Health Maintenance  Topic Date Due   Zoster Vaccines- Shingrix (1 of 2) Never done   DEXA SCAN  Never done   COVID-19 Vaccine (5 - Booster for Pfizer series) 10/18/2020   OPHTHALMOLOGY EXAM  10/22/2020   TETANUS/TDAP  08/21/2021 (Originally 08/30/1974)   HEMOGLOBIN A1C  03/30/2021   Pneumonia Vaccine 65+ Years old (2 - PPSV23 if  available, else PCV20) 09/30/2021   FOOT EXAM  09/30/2021   URINE MICROALBUMIN  09/30/2021   MAMMOGRAM  10/18/2021   COLONOSCOPY (Pts 45-44yrs Insurance coverage will need to be confirmed)  11/11/2024   INFLUENZA VACCINE  Completed   Hepatitis C Screening  Completed   HPV VACCINES  Aged Out    Health Maintenance  Health Maintenance Due  Topic Date Due   Zoster Vaccines- Shingrix (1 of 2) Never done   DEXA SCAN  Never done   COVID-19 Vaccine (5 - Booster for Pfizer series) 10/18/2020   OPHTHALMOLOGY EXAM  10/22/2020    Colorectal cancer screening: Type of screening: Colonoscopy. Completed 2016. Repeat every 10 years  Mammogram status: Completed 2022. Repeat every year   Bone Density  completed 2022  Lung Cancer Screening: (Low Dose CT Chest recommended if Age 26-80 years, 30 pack-year currently smoking OR have quit w/in 15years.) does not qualify.   Lung Cancer Screening Referral:   Additional Screening:  Hepatitis C Screening: 2022  completed  Vision Screening: Recommended annual ophthalmology exams for early detection of glaucoma and other disorders of the eye. Is the patient up to date with their annual eye exam?  Yes  Who is the provider or what is the name of the office in which the patient attends annual eye exams? [facility] If pt is not established with a provider, would they like to  be referred to a provider to establish care? No .   Dental Screening: Recommended annual dental exams for proper oral hygiene  Community Resource Referral / Chronic Care Management: CRR required this visit?  No   CCM required this visit?  No      Plan:     I have personally reviewed and noted the following in the patient's chart:   Medical and social history Use of alcohol, tobacco or illicit drugs  Current medications and supplements including opioid prescriptions.  Functional ability and status Nutritional status Physical activity Advanced directives List of  other physicians Hospitalizations, surgeries, and ER visits in previous 12 months Vitals Screenings to include cognitive, depression, and falls Referrals and appointments  In addition, I have reviewed and discussed with patient certain preventive protocols, quality metrics, and best practice recommendations. A written personalized care plan for preventive services as well as general preventive health recommendations were provided to patient.     Remi Haggard, LPN   [id number]   Nurse Notes:

## 2020-12-03 NOTE — Patient Instructions (Signed)
[name] , Thank you for taking time to come for your Medicare Wellness Visit. I appreciate your ongoing commitment to your health goals. Please review the following plan we discussed and let me know if I can assist you in the future.   Screening recommendations/referrals: Colonoscopy: up to date Mammogram: up to date Bone Density: up to date Recommended yearly ophthalmology/optometry visit for glaucoma screening and checkup Recommended yearly dental visit for hygiene and checkup  Vaccinations: Influenza vaccine: up to date Pneumococcal vaccine: up to date Tdap vaccine: Education provided Shingles vaccine: Education provided    Advanced directives: Education provided  Conditions/risks identified:    Next appointment: 12-30-2020 @ 4:00 [facility] 67 Years and Older, Female Preventive care refers to lifestyle choices and visits with your health care provider that can promote health and wellness. What does preventive care include? A yearly physical exam. This is also called an annual well check. Dental exams once or twice a year. Routine eye exams. Ask your health care provider how often you should have your eyes checked. Personal lifestyle choices, including: Daily care of your teeth and gums. Regular physical activity. Eating a healthy diet. Avoiding tobacco and drug use. Limiting alcohol use. Practicing safe sex. Taking low-dose aspirin every day. Taking vitamin and mineral supplements as recommended by your health care provider. What happens during an annual well check? The services and screenings done by your health care provider during your annual well check will depend on your age, overall health, lifestyle risk factors, and family history of disease. Counseling  Your health care provider may ask you questions about your: Alcohol use. Tobacco use. Drug use. Emotional well-being. Home and relationship well-being. Sexual activity. Eating  habits. History of falls. Memory and ability to understand (cognition). Work and work Astronomer. Reproductive health. Screening  You may have the following tests or measurements: Height, weight, and BMI. Blood pressure. Lipid and cholesterol levels. These may be checked every 5 years, or more frequently if you are over 64 years old. Skin check. Lung cancer screening. You may have this screening every year starting at age 31 if you have a 30-pack-year history of smoking and currently smoke or have quit within the past 15 years. Fecal occult blood test (FOBT) of the stool. You may have this test every year starting at age 73. Flexible sigmoidoscopy or colonoscopy. You may have a sigmoidoscopy every 5 years or a colonoscopy every 10 years starting at age 37. Hepatitis C blood test. Hepatitis B blood test. Sexually transmitted disease (STD) testing. Diabetes screening. This is done by checking your blood sugar (glucose) after you have not eaten for a while (fasting). You may have this done every 1-3 years. Bone density scan. This is done to screen for osteoporosis. You may have this done starting at age 74. Mammogram. This may be done every 1-2 years. Talk to your health care provider about how often you should have regular mammograms. Talk with your health care provider about your test results, treatment options, and if necessary, the need for more tests. Vaccines  Your health care provider may recommend certain vaccines, such as: Influenza vaccine. This is recommended every year. Tetanus, diphtheria, and acellular pertussis (Tdap, Td) vaccine. You may need a Td booster every 10 years. Zoster vaccine. You may need this after age 60. Pneumococcal 13-valent conjugate (PCV13) vaccine. One dose is recommended after age 72. Pneumococcal polysaccharide (PPSV23) vaccine. One dose is recommended after age 60. Talk to your health care provider  about which screenings and vaccines you need and how  often you need them. This information is not intended to replace advice given to you by your health care provider. Make sure you discuss any questions you have with your health care provider. Document Released: 01/25/2015 Document Revised: 09/18/2015 Document Reviewed: 10/30/2014 Elsevier Interactive Patient Education  2017 [place] Prevention in the Home Falls can cause injuries. They can happen to people of all ages. There are many things you can do to make your home safe and to help prevent falls. What can I do on the outside of my home? Regularly fix the edges of walkways and driveways and fix any cracks. Remove anything that might make you trip as you walk through a door, such as a raised step or threshold. Trim any bushes or trees on the path to your home. Use bright outdoor lighting. Clear any walking paths of anything that might make someone trip, such as rocks or tools. Regularly check to see if handrails are loose or broken. Make sure that both sides of any steps have handrails. Any raised decks and porches should have guardrails on the edges. Have any leaves, snow, or ice cleared regularly. Use sand or salt on walking paths during winter. Clean up any spills in your garage right away. This includes oil or grease spills. What can I do in the bathroom? Use night lights. Install grab bars by the toilet and in the tub and shower. Do not use towel bars as grab bars. Use non-skid mats or decals in the tub or shower. If you need to sit down in the shower, use a plastic, non-slip stool. Keep the floor dry. Clean up any water that spills on the floor as soon as it happens. Remove soap buildup in the tub or shower regularly. Attach bath mats securely with double-sided non-slip rug tape. Do not have throw rugs and other things on the floor that can make you trip. What can I do in the bedroom? Use night lights. Make sure that you have a light by your bed that is easy to  reach. Do not use any sheets or blankets that are too big for your bed. They should not hang down onto the floor. Have a firm chair that has side arms. You can use this for support while you get dressed. Do not have throw rugs and other things on the floor that can make you trip. What can I do in the kitchen? Clean up any spills right away. Avoid walking on wet floors. Keep items that you use a lot in easy-to-reach places. If you need to reach something above you, use a strong step stool that has a grab bar. Keep electrical cords out of the way. Do not use floor polish or wax that makes floors slippery. If you must use wax, use non-skid floor wax. Do not have throw rugs and other things on the floor that can make you trip. What can I do with my stairs? Do not leave any items on the stairs. Make sure that there are handrails on both sides of the stairs and use them. Fix handrails that are broken or loose. Make sure that handrails are as long as the stairways. Check any carpeting to make sure that it is firmly attached to the stairs. Fix any carpet that is loose or worn. Avoid having throw rugs at the top or bottom of the stairs. If you do have throw rugs, attach them to the floor  with carpet tape. Make sure that you have a light switch at the top of the stairs and the bottom of the stairs. If you do not have them, ask someone to add them for you. What else can I do to help prevent falls? Wear shoes that: Do not have high heels. Have rubber bottoms. Are comfortable and fit you well. Are closed at the toe. Do not wear sandals. If you use a stepladder: Make sure that it is fully opened. Do not climb a closed stepladder. Make sure that both sides of the stepladder are locked into place. Ask someone to hold it for you, if possible. Clearly mark and make sure that you can see: Any grab bars or handrails. First and last steps. Where the edge of each step is. Use tools that help you move  around (mobility aids) if they are needed. These include: Canes. Walkers. Scooters. Crutches. Turn on the lights when you go into a dark area. Replace any light bulbs as soon as they burn out. Set up your furniture so you have a clear path. Avoid moving your furniture around. If any of your floors are uneven, fix them. If there are any pets around you, be aware of where they are. Review your medicines with your doctor. Some medicines can make you feel dizzy. This can increase your chance of falling. Ask your doctor what other things that you can do to help prevent falls. This information is not intended to replace advice given to you by your health care provider. Make sure you discuss any questions you have with your health care provider. Document Released: 10/25/2008 Document Revised: 06/06/2015 Document Reviewed: 02/02/2014 Elsevier Interactive Patient Education  2017 [employer].

## 2020-12-23 ENCOUNTER — Telehealth: Payer: HMO

## 2020-12-30 ENCOUNTER — Ambulatory Visit: Payer: HMO | Admitting: Family Medicine

## 2021-01-17 ENCOUNTER — Ambulatory Visit: Payer: HMO | Admitting: Family Medicine

## 2021-02-03 ENCOUNTER — Ambulatory Visit (INDEPENDENT_AMBULATORY_CARE_PROVIDER_SITE_OTHER): Payer: HMO

## 2021-02-03 ENCOUNTER — Telehealth: Payer: Self-pay

## 2021-02-03 DIAGNOSIS — M1A9XX0 Chronic gout, unspecified, without tophus (tophi): Secondary | ICD-10-CM

## 2021-02-03 DIAGNOSIS — E785 Hyperlipidemia, unspecified: Secondary | ICD-10-CM

## 2021-02-03 DIAGNOSIS — E1142 Type 2 diabetes mellitus with diabetic polyneuropathy: Secondary | ICD-10-CM

## 2021-02-03 NOTE — Progress Notes (Signed)
Chronic Care Management Pharmacy Note  02/03/2021 Name:  Christie Bryant MRN:  [id number] DOB:  [birth year]  Summary: Trulicity PAP approved per 11/2020 fax in media section - renewed for 2023 Has been taking trulicity 8.93 mg sq once weekly. After starting trulicity has had some AM FBGs in 60s - consider Tresiba dose decrease to 40 units nightly  Consider Trulicity dose increase to 1.5 mg SQ once weekly - PCP f/u 02/04/21 - will place Rx in prescriber bin if wanting to consider Allopurinol stopped last PCP visit - denies any issues with gout, understands triggers. Seafood would be biggest target to avoid for low purine diet.   Subjective: Christie Bryant is an 66 y.o. year old female who is a primary patient of Valerie Roys, DO.  The CCM team was consulted for assistance with disease management and care coordination needs.    Engaged with patient by telephone for follow up visit in response to provider referral for pharmacy case management and/or care coordination services.   Consent to Services:  The patient was given the following information about Chronic Care Management services, agreed to services, and gave verbal consent: see initial encounter for details.  Patient Care Team: Valerie Roys, DO as PCP - General (Family Medicine) Christene Lye, MD (General Surgery) Jackolyn Confer, MD (Internal Medicine) Judi Cong, MD as Physician Assistant (Endocrinology) [name], [facility] (Pharmacist)  Hospital visits: None in previous 6 months  Objective:  Lab Results  Component Value Date   CREATININE 1.1 07/01/2020   CREATININE 0.85 10/09/2019   CREATININE 0.85 10/08/2019    Lab Results  Component Value Date   HGBA1C 7.4 (H) 09/30/2020   HGBA1C 7.6 07/01/2020   HGBA1C 7.7 (H) 10/07/2019  UACR 31.4 on 07/01/20  Last diabetic Eye exam:  Lab Results  Component Value Date/Time   HMDIABEYEEXA No Retinopathy 11/20/2012 12:00 AM    Last diabetic Foot exam:  Lab  Results  Component Value Date/Time   HMDIABFOOTEX Normal 11/20/2013 12:00 AM        Component Value Date/Time   CHOL 199 11/20/2013 0838   CHOL 147 03/16/2013 0801   CHOL 203 (H) 12/05/2012 0933   TRIG 151.0 (H) 11/20/2013 0838   TRIG 82.0 03/16/2013 0801   TRIG 131.0 12/05/2012 0933   HDL 37.20 (L) 11/20/2013 0838   HDL 42.30 03/16/2013 0801   HDL 47.10 12/05/2012 0933   CHOLHDL 5 11/20/2013 0838   VLDL 30.2 11/20/2013 0838   LDLCALC 132 (H) 11/20/2013 0838   LDLCALC 88 03/16/2013 0801   LDLDIRECT 142.6 12/05/2012 0933    Hepatic Function Latest Ref Rng & Units 10/06/2019 11/20/2013 03/16/2013  Total Protein 6.5 - 8.1 g/dL 7.7 7.5 7.6  Albumin 3.5 - 5.0 g/dL 3.8 3.5 3.4(L)  AST 15 - 41 U/L $Remo'27 20 13  'IrHZK$ ALT 0 - 44 U/L $Remo'24 20 14  'otqir$ Alk Phosphatase 38 - 126 U/L 62 64 74  Total Bilirubin 0.3 - 1.2 mg/dL 1.5(H) 1.0 1.4(H)    Lab Results  Component Value Date/Time   TSH 6.71 (H) 03/16/2013 08:01 AM   TSH 6.96 (H) 01/16/2013 08:30 AM   FREET4 0.95 01/16/2013 08:30 AM    CBC Latest Ref Rng & Units 10/09/2019 10/08/2019 10/06/2019  WBC 4.0 - 10.5 K/uL 7.5 10.3 12.3(H)  Hemoglobin 12.0 - 15.0 g/dL 10.9(L) 10.6(L) 13.0  Hematocrit 36.0 - 46.0 % 34.6(L) 32.9(L) 41.0  Platelets 150 - 400 K/uL 162 158 190   No results  found for: VD25OH  Clinical ASCVD:  The ASCVD Risk score (Arnett DK, et al., 2019) failed to calculate for the following reasons:   Cannot find a previous HDL lab   Cannot find a previous total cholesterol lab   Social History   Tobacco Use  Smoking Status Never  Smokeless Tobacco Never   BP Readings from Last 3 Encounters:  09/30/20 137/80  08/21/20 131/70  10/10/19 132/76   Pulse Readings from Last 3 Encounters:  09/30/20 67  08/21/20 61  10/10/19 70   Wt Readings from Last 3 Encounters:  09/30/20 233 lb 3.2 oz (105.8 kg)  08/21/20 233 lb 9.6 oz (106 kg)  01/09/20 230 lb (104.3 kg)   BMI Readings from Last 3 Encounters:  09/30/20 33.46 kg/m  08/21/20  35.83 kg/m  01/09/20 33.00 kg/m    Assessment: Review of patient past medical history, allergies, medications, health status, including review of consultants reports, laboratory and other test data, was performed as part of comprehensive evaluation and provision of chronic care management services.   SDOH:  (Social Determinants of Health) assessments and interventions performed: Yes   CCM Care Plan  No Known Allergies  Medications Reviewed Today     Reviewed by [name], [facility] (Pharmacist) on 02/03/21 at 1238  Med List Status: <None>   Medication Order Taking? Sig Documenting Provider Last Dose Status Informant  acetaminophen (TYLENOL) 325 MG tablet [id number]  Take 2 tablets (650 mg total) by mouth every 6 (six) hours as needed for mild pain (or Fever >/= 101). Terrilee Croak, MD  Active   Cholecalciferol (VITAMIN D-3 PO) [id number]  Take 2,000 Units by mouth daily.  [provider]  Active Self  docusate sodium (COLACE) 100 MG capsule [id number]  Take 100 mg by mouth daily.  [provider]  Active Self  Dulaglutide (TRULICITY) 0.93 OI/7.1IW SOPN [id number] Yes Inject into the skin. Once a week [provider]  Active   Insulin Pen Needle (PEN NEEDLES 3/16") 31G X 5 MM MISC [id number]  1 each by Does not apply route once a week. Johnson, Megan P, DO  Active   levothyroxine (SYNTHROID) 125 MCG tablet [id number]  TAKE 1 TABLET BY MOUTH ONCE DAILY ON  AN  EMPTY  STOMACH  WITH  A  GLASS  OF  WATER  AT  LEAST  30  TO  1  MINUTES  BEFORE  BREAKFAST [provider]  Active   metFORMIN (GLUCOPHAGE) 1000 MG tablet [id number] Yes Take 1,000 mg by mouth 2 (two) times daily with a meal. [provider]  Active Self  metoprolol tartrate (LOPRESSOR) 100 MG tablet [id number]  Take 1 tablet by mouth 2 (two) times daily. [provider]  Active   Multiple Vitamin (MULTIVITAMIN) capsule [id number]  Take 1 capsule by mouth daily.  Patient not taking: Reported  on 12/03/2020   [provider]  Active Self  nortriptyline (PAMELOR) 10 MG capsule [id number]  Take 1 capsule by mouth at bedtime Wynetta Emery, Megan P, DO  Active   omeprazole (PRILOSEC) 20 MG capsule [id number]  Take 20 mg by mouth daily. [provider]  Active   pravastatin (PRAVACHOL) 40 MG tablet [id number]  TAKE ONE TABLET BY MOUTH ONCE DAILY  Patient taking differently: Take 40 mg by mouth every evening.   Jackolyn Confer, MD  Active    Patient not taking:   Discontinued 02/03/21 1238 (Patient has not taken in last 30 days)   TRESIBA FLEXTOUCH 200 UNIT/ML FlexTouch  Pen [id number] Yes Inject 60-100 Units into the skin at bedtime.  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   DM type 2 with diabetic peripheral neuropathy ([place]) 08/03/2018   Hypertension 09/08/2011   Hypothyroidism 09/08/2011   Hyperlipidemia with target low density lipoprotein (LDL) cholesterol less than 70 mg/dL 09/08/2011   Neuropathy in diabetes ([place]) 09/08/2011    Immunization History  Administered Date(s) Administered   Fluad Quad(high Dose 65+) 09/30/2020   Influenza-Unspecified 10/26/2012, 10/26/2013, 09/13/2014   PFIZER(Purple Top)SARS-COV-2 Vaccination 02/27/2019, 03/20/2019, 10/16/2019, 08/23/2020   Pneumococcal Conjugate-13 09/30/2020    Conditions to be addressed/monitored: HLD Hypothyroidism T2DM Gout   Care Plan : ccm pharmacy care plan  Updates made by [name], [facility] since 02/03/2021 12:00 AM     Problem: hld htn dmii hypothyroidism gout   Priority: High     Long-Range Goal: disease management   Start Date: 09/30/2020  Expected End Date: 09/30/2021  Recent Progress: On track   Priority: High  Note:    Current Barriers:  Unable to achieve control of T2DM   Pharmacist Clinical Goal(s):  Patient will verbalize ability to afford treatment regimen achieve improvement in T2DM as evidenced by blood sugar goals through collaboration with PharmD and provider.   Interventions: 1:1 collaboration with Valerie Roys, DO regarding development and update of comprehensive plan of care as evidenced by provider attestation and co-signature Inter-disciplinary care team collaboration (see longitudinal plan of care) Comprehensive medication review performed; medication list updated in electronic medical record  Hyperlipidemia: (LDL goal < 100) -Controlled -Current treatment: Pravastatin 40 mg once daily  -Reviewed tolerability/side effects - no problems noted -Educated on Cholesterol goals;  -Counseled on diet and exercise extensively Recommended to continue current medication  Diabetes (A1c goal <7%) -Not ideally controlled -Current medications: Metformin 1000 mg twice daily  Tresiba 60-100 units daily at night  Trulicity 7.82 mg once weekly -Medications previously tried: Geneticist, molecular (cost), Ozempic (cost), Wilder Glade (cost), glimepiride, liraglutide -Current home glucose readings September 2022 fasting glucose: varies; has dropped to 60s range after starting on Trulicity  post prandial glucose: 100s-200s -Denies hypoglycemic/hyperglycemic symptoms -Current meal patterns: healthy choice  Feels sweets are the main issue. breakfast: typically skips if working. lunch: chicken sandwich  -Current exercise: walking some with dogs in evening.  -Educated on A1c and blood sugar goals; -Counseled on diet and exercise extensively Counsled on hypoglycemia due to some readings in the 60s has self adjusted tresiba to 40 units QHS at times. Reviewed rule of 15 for BG correction.  Recommend trulicity increase to 1.5 mg Qweek and reducing tresiba to 40units qhs  Gout (Goal:  minimize symptoms) -Controlled - allopurinol stopped 12/2020 -Current treatment  No Rx therapy -Medications previously tried: n/a -Further reviewed low purine diet in detail, handout provided  -Counseled on diet and exercise extensively -Patient to continue low purine diet - will try her best to cut down on seafood.  Patient Goals/Self-Care Activities Patient will:  - take medications as prescribed collaborate with provider on medication access solutions as needed  Medication Assistance: Application for Trulicity  medication assistance program approved through 2023     Patient's preferred pharmacy is:  [facility] -  [place], [place] - [address] [place] [address] Phone: [phone] Fax: [phone]  Follow Up:  Patient agrees to Care Plan and Follow-up. Plan: HC 1 month DM. CPP 3 month telephone f/u  Future Appointments  Date Time Provider [place]  02/04/2021  1:00 PM Valerie Roys, DO [facility]  05/05/2021  9:00 AM CFP CCM PHARMACY [facility]    [name], PharmD, CPP Clinical Pharmacist Practitioner  [place]  [phone]

## 2021-02-03 NOTE — Telephone Encounter (Signed)
°  Care Management   Follow Up Note   02/03/2021 Name: Christie Bryant MRN: [id number] DOB: [birth year]   Referred by: Dorcas Carrow, DO Reason for referral : Chronic Care Management (PharmD f/u CCM telephone visit )   An unsuccessful telephone outreach was attempted today. The patient was referred to the case management team for assistance with care management and care coordination.   Follow Up Plan:  Care guide to RS pt. LVM w/ pt 1/23 1115am  Dahlia Byes, PharmD, BCGP Clinical Pharmacist  [facility]  [phone]

## 2021-02-03 NOTE — Patient Instructions (Signed)
Christie Bryant,  Thank you for talking with me today. I have included our care plan/goals in the following pages.   Please review and call me at [phone] with any questions.  Thanks! Christie Bryant, PharmD Clinical Pharmacist  [phone]  Care Plan : ccm pharmacy care plan  Updates made by Dahlia Byes, [facility] since 02/03/2021 12:00 AM     Problem: hld htn dmii hypothyroidism gout   Priority: High     Long-Range Goal: disease management   Start Date: 09/30/2020  Expected End Date: 09/30/2021  Recent Progress: On track  Priority: High  Note:    Current Barriers:  Unable to achieve control of T2DM   Pharmacist Clinical Goal(s):  Patient will verbalize ability to afford treatment regimen achieve improvement in T2DM as evidenced by blood sugar goals through collaboration with PharmD and provider.   Interventions: 1:1 collaboration with Dorcas Carrow, DO regarding development and update of comprehensive plan of care as evidenced by provider attestation and co-signature Inter-disciplinary care team collaboration (see longitudinal plan of care) Comprehensive medication review performed; medication list updated in electronic medical record  Hyperlipidemia: (LDL goal < 100) -Controlled -Current treatment: Pravastatin 40 mg once daily  -Reviewed tolerability/side effects - no problems noted -Educated on Cholesterol goals;  -Counseled on diet and exercise extensively Recommended to continue current medication  Diabetes (A1c goal <7%) -Not ideally controlled -Current medications: Metformin 1000 mg twice daily  Tresiba 60-100 units daily at night  Trulicity 0.75 mg once weekly -Medications previously tried: Theme park manager (cost), Ozempic (cost), Marcelline Deist (cost), glimepiride, liraglutide -Current home glucose readings September 2022 fasting glucose: varies; has dropped to 60s range after starting on Trulicity  post prandial glucose: 100s-200s -Denies  hypoglycemic/hyperglycemic symptoms -Current meal patterns: healthy choice  Feels sweets are the main issue. breakfast: typically skips if working. lunch: chicken sandwich  -Current exercise: walking some with dogs in evening.  -Educated on A1c and blood sugar goals; -Counseled on diet and exercise extensively Counsled on hypoglycemia due to some readings in the 60s has self adjusted tresiba to 40 units QHS at times. Reviewed rule of 15 for BG correction.  Recommend trulicity increase to 1.5 mg Qweek and reducing tresiba to 40units qhs  Gout (Goal: minimize symptoms) -Controlled - allopurinol stopped 12/2020 -Current treatment  No Rx therapy -Medications previously tried: n/a -Further reviewed low purine diet in detail, handout provided  -Counseled on diet and exercise extensively -Patient to continue low purine diet - will try her best to cut down on seafood.  Patient Goals/Self-Care Activities Patient will:  - take medications as prescribed collaborate with provider on medication access solutions as needed  Medication Assistance: Application for Trulicity  medication assistance program approved through 2023    The patient verbalized understanding of instructions provided today and agreed to receive a MyChart copy of patient instruction and/or educational materials. Telephone follow up appointment with pharmacy team member scheduled for: See next appointment with "Care Management Staff" under "What's Next" below.

## 2021-02-03 NOTE — Telephone Encounter (Signed)
No action required. Pt returned called f/u visit completed.

## 2021-02-04 ENCOUNTER — Encounter: Payer: Self-pay | Admitting: Family Medicine

## 2021-02-04 ENCOUNTER — Other Ambulatory Visit: Payer: Self-pay

## 2021-02-04 ENCOUNTER — Ambulatory Visit (INDEPENDENT_AMBULATORY_CARE_PROVIDER_SITE_OTHER): Payer: HMO | Admitting: Family Medicine

## 2021-02-04 VITALS — BP 117/77 | HR 69 | Temp 98.0°F | Wt 229.4 lb

## 2021-02-04 DIAGNOSIS — E1142 Type 2 diabetes mellitus with diabetic polyneuropathy: Secondary | ICD-10-CM | POA: Diagnosis not present

## 2021-02-04 LAB — BAYER DCA HB A1C WAIVED: HB A1C (BAYER DCA - WAIVED): 6.6 % — ABNORMAL HIGH (ref 4.8–5.6)

## 2021-02-04 MED ORDER — TRESIBA FLEXTOUCH 200 UNIT/ML ~~LOC~~ SOPN: 40.0000 [IU] | PEN_INJECTOR | Freq: Every day | SUBCUTANEOUS | 12 refills | Status: AC

## 2021-02-04 MED ORDER — TRULICITY 1.5 MG/0.5ML ~~LOC~~ SOAJ: 1.5000 mg | SUBCUTANEOUS | 1 refills | Status: AC

## 2021-02-04 MED ORDER — NORTRIPTYLINE HCL 10 MG PO CAPS
10.0000 mg | ORAL_CAPSULE | Freq: Every day | ORAL | 1 refills | Status: DC
Start: 2021-02-04 — End: 2021-08-05

## 2021-02-04 NOTE — Progress Notes (Signed)
BP 117/77    Pulse 69    Temp 98 F (36.7 C)    Wt 229 lb 6.4 oz (104.1 kg)    SpO2 98%    BMI 32.92 kg/m    Subjective:    Patient ID: Christie Bryant, female    DOB: Dec 22, 1955, 66 y.o.   MRN: [id number]  HPI: Christie Bryant is a 66 y.o. female  Chief Complaint  Patient presents with   Diabetes    Patient has upcoming eye appointment    DIABETES Hypoglycemic episodes:a couple in the AMs Polydipsia/polyuria: no Visual disturbance: no Chest pain: no Paresthesias: no Glucose Monitoring: yes  Accucheck frequency: Daily Taking Insulin?: yes Blood Pressure Monitoring: not checking Retinal Examination: Up to Date Foot Exam: Up to Date Diabetic Education: Completed Pneumovax: Up to Date Influenza: Up to Date Aspirin: yes  Relevant past medical, surgical, family and social history reviewed and updated as indicated. Interim medical history since our last visit reviewed. Allergies and medications reviewed and updated.  Review of Systems  Constitutional: Negative.   Respiratory: Negative.    Cardiovascular: Negative.   Gastrointestinal: Negative.   Musculoskeletal: Negative.   Neurological: Negative.   Psychiatric/Behavioral: Negative.     Per HPI unless specifically indicated above     Objective:    BP 117/77    Pulse 69    Temp 98 F (36.7 C)    Wt 229 lb 6.4 oz (104.1 kg)    SpO2 98%    BMI 32.92 kg/m   Wt Readings from Last 3 Encounters:  02/04/21 229 lb 6.4 oz (104.1 kg)  09/30/20 233 lb 3.2 oz (105.8 kg)  08/21/20 233 lb 9.6 oz (106 kg)    Physical Exam Vitals and nursing note reviewed.  Constitutional:      General: She is not in acute distress.    Appearance: Normal appearance. She is not ill-appearing, toxic-appearing or diaphoretic.  HENT:     Head: Normocephalic and atraumatic.     Right Ear: External ear normal.     Left Ear: External ear normal.     Nose: Nose normal.     Mouth/Throat:     Mouth: Mucous membranes are moist.     Pharynx: Oropharynx  is clear.  Eyes:     General: No scleral icterus.       Right eye: No discharge.        Left eye: No discharge.     Extraocular Movements: Extraocular movements intact.     Conjunctiva/sclera: Conjunctivae normal.     Pupils: Pupils are equal, round, and reactive to light.  Cardiovascular:     Rate and Rhythm: Normal rate and regular rhythm.     Pulses: Normal pulses.     Heart sounds: Normal heart sounds. No murmur heard.   No friction rub. No gallop.  Pulmonary:     Effort: Pulmonary effort is normal. No respiratory distress.     Breath sounds: Normal breath sounds. No stridor. No wheezing, rhonchi or rales.  Chest:     Chest wall: No tenderness.  Musculoskeletal:        General: Normal range of motion.     Cervical back: Normal range of motion and neck supple.  Skin:    General: Skin is warm and dry.     Capillary Refill: Capillary refill takes less than 2 seconds.     Coloration: Skin is not jaundiced or pale.     Findings: No bruising, erythema, lesion or  rash.  Neurological:     General: No focal deficit present.     Mental Status: She is alert and oriented to person, place, and time. Mental status is at baseline.  Psychiatric:        Mood and Affect: Mood normal.        Behavior: Behavior normal.        Thought Content: Thought content normal.        Judgment: Judgment normal.    Results for orders placed or performed in visit on 10/18/20  HM MAMMOGRAPHY  Result Value Ref Range   HM Mammogram 0-4 Bi-Rad 0-4 Bi-Rad, Self Reported Normal      Assessment & Plan:   Problem List Items Addressed This Visit       Endocrine   DM type 2 with diabetic peripheral neuropathy (HCC) - Primary (Chronic)    Feeling well. Will recheck A1c today. Will increase her trulicity to 1.5mg  and decrease her tresiba to 40units. Goal of getting her off insulin. Continue to monitor. Recheck 3 months.       Relevant Medications   TRESIBA FLEXTOUCH 200 UNIT/ML FlexTouch Pen    Dulaglutide (TRULICITY) 1.5 MG/0.5ML SOPN   nortriptyline (PAMELOR) 10 MG capsule   Other Relevant Orders   Bayer DCA Hb A1c Waived     Follow up plan: Return in about 3 months (around 05/05/2021).

## 2021-02-04 NOTE — Assessment & Plan Note (Signed)
Feeling well. Will recheck A1c today. Will increase her trulicity to 1.5mg  and decrease her tresiba to 40units. Goal of getting her off insulin. Continue to monitor. Recheck 3 months.

## 2021-02-11 DIAGNOSIS — E1142 Type 2 diabetes mellitus with diabetic polyneuropathy: Secondary | ICD-10-CM

## 2021-02-11 DIAGNOSIS — E785 Hyperlipidemia, unspecified: Secondary | ICD-10-CM | POA: Diagnosis not present

## 2021-02-13 DIAGNOSIS — H2513 Age-related nuclear cataract, bilateral: Secondary | ICD-10-CM | POA: Diagnosis not present

## 2021-02-13 LAB — HM DIABETES EYE EXAM

## 2021-04-24 ENCOUNTER — Emergency Department
Admit: 2021-04-24 | Discharge: 2021-04-24 | Disposition: A | Discharge status: Home / Self Care | Payer: HMO | Attending: Neurology | Admitting: Neurology

## 2021-04-24 ENCOUNTER — Emergency Department: Payer: HMO

## 2021-04-24 ENCOUNTER — Inpatient Hospital Stay
Admission: EM | Admit: 2021-04-24 | Discharge: 2021-04-28 | DRG: 066 | Disposition: A | Discharge status: Home Health | Payer: HMO | Attending: Hospitalist | Admitting: Hospitalist

## 2021-04-24 DIAGNOSIS — Z90710 Acquired absence of both cervix and uterus: Secondary | ICD-10-CM

## 2021-04-24 DIAGNOSIS — Z6831 Body mass index (BMI) 31.0-31.9, adult: Secondary | ICD-10-CM | POA: Diagnosis not present

## 2021-04-24 DIAGNOSIS — E785 Hyperlipidemia, unspecified: Secondary | ICD-10-CM | POA: Diagnosis present

## 2021-04-24 DIAGNOSIS — Z7984 Long term (current) use of oral hypoglycemic drugs: Secondary | ICD-10-CM | POA: Diagnosis not present

## 2021-04-24 DIAGNOSIS — Z8249 Family history of ischemic heart disease and other diseases of the circulatory system: Secondary | ICD-10-CM | POA: Diagnosis not present

## 2021-04-24 DIAGNOSIS — Z833 Family history of diabetes mellitus: Secondary | ICD-10-CM

## 2021-04-24 DIAGNOSIS — E669 Obesity, unspecified: Secondary | ICD-10-CM | POA: Diagnosis present

## 2021-04-24 DIAGNOSIS — Z8673 Personal history of transient ischemic attack (TIA), and cerebral infarction without residual deficits: Secondary | ICD-10-CM

## 2021-04-24 DIAGNOSIS — E039 Hypothyroidism, unspecified: Secondary | ICD-10-CM | POA: Diagnosis present

## 2021-04-24 DIAGNOSIS — R42 Dizziness and giddiness: Secondary | ICD-10-CM | POA: Diagnosis present

## 2021-04-24 DIAGNOSIS — E1142 Type 2 diabetes mellitus with diabetic polyneuropathy: Secondary | ICD-10-CM | POA: Diagnosis present

## 2021-04-24 DIAGNOSIS — I63542 Cerebral infarction due to unspecified occlusion or stenosis of left cerebellar artery: Secondary | ICD-10-CM | POA: Diagnosis not present

## 2021-04-24 DIAGNOSIS — I639 Cerebral infarction, unspecified: Secondary | ICD-10-CM

## 2021-04-24 DIAGNOSIS — Z20822 Contact with and (suspected) exposure to covid-19: Secondary | ICD-10-CM | POA: Diagnosis present

## 2021-04-24 DIAGNOSIS — I447 Left bundle-branch block, unspecified: Secondary | ICD-10-CM | POA: Diagnosis not present

## 2021-04-24 DIAGNOSIS — I10 Essential (primary) hypertension: Secondary | ICD-10-CM | POA: Diagnosis present

## 2021-04-24 DIAGNOSIS — Z794 Long term (current) use of insulin: Secondary | ICD-10-CM

## 2021-04-24 DIAGNOSIS — R000 Tachycardia, unspecified: Secondary | ICD-10-CM | POA: Diagnosis not present

## 2021-04-24 DIAGNOSIS — I6523 Occlusion and stenosis of bilateral carotid arteries: Secondary | ICD-10-CM | POA: Diagnosis not present

## 2021-04-24 DIAGNOSIS — R29700 NIHSS score 0: Secondary | ICD-10-CM | POA: Diagnosis present

## 2021-04-24 DIAGNOSIS — Z823 Family history of stroke: Secondary | ICD-10-CM | POA: Diagnosis not present

## 2021-04-24 DIAGNOSIS — Z79899 Other long term (current) drug therapy: Secondary | ICD-10-CM | POA: Diagnosis not present

## 2021-04-24 DIAGNOSIS — Z800 Family history of malignant neoplasm of digestive organs: Secondary | ICD-10-CM | POA: Diagnosis not present

## 2021-04-24 DIAGNOSIS — E876 Hypokalemia: Secondary | ICD-10-CM | POA: Diagnosis present

## 2021-04-24 DIAGNOSIS — R55 Syncope and collapse: Secondary | ICD-10-CM | POA: Diagnosis not present

## 2021-04-24 DIAGNOSIS — R29818 Other symptoms and signs involving the nervous system: Secondary | ICD-10-CM | POA: Diagnosis not present

## 2021-04-24 HISTORY — DX: Cerebral infarction, unspecified: I63.9

## 2021-04-24 LAB — PROTIME-INR
INR: 1.1 (ref 0.8–1.2)
Prothrombin Time: 13.9 seconds (ref 11.4–15.2)

## 2021-04-24 LAB — HEPATIC FUNCTION PANEL
ALT: 22 U/L (ref 0–44)
AST: 27 U/L (ref 15–41)
Albumin: 3.9 g/dL (ref 3.5–5.0)
Alkaline Phosphatase: 51 U/L (ref 38–126)
Bilirubin, Direct: 0.2 mg/dL (ref 0.0–0.2)
Indirect Bilirubin: 1.1 mg/dL — ABNORMAL HIGH (ref 0.3–0.9)
Total Bilirubin: 1.3 mg/dL — ABNORMAL HIGH (ref 0.3–1.2)
Total Protein: 7.4 g/dL (ref 6.5–8.1)

## 2021-04-24 LAB — ECHOCARDIOGRAM COMPLETE BUBBLE STUDY
AR max vel: 2.92 cm2
AV Area VTI: 3.4 cm2
AV Area mean vel: 3.13 cm2
AV Mean grad: 3 mmHg
AV Peak grad: 5.4 mmHg
Ao pk vel: 1.16 m/s
Area-P 1/2: 4.6 cm2
S' Lateral: 3.3 cm

## 2021-04-24 LAB — APTT: aPTT: 26 seconds (ref 24–36)

## 2021-04-24 LAB — BASIC METABOLIC PANEL
Anion gap: 9 (ref 5–15)
BUN: 17 mg/dL (ref 8–23)
CO2: 23 mmol/L (ref 22–32)
Calcium: 9.3 mg/dL (ref 8.9–10.3)
Chloride: 102 mmol/L (ref 98–111)
Creatinine, Ser: 1.05 mg/dL — ABNORMAL HIGH (ref 0.44–1.00)
GFR, Estimated: 58 mL/min — ABNORMAL LOW (ref >60)
Glucose, Bld: 213 mg/dL — ABNORMAL HIGH (ref 70–99)
Potassium: 3 mmol/L — ABNORMAL LOW (ref 3.5–5.1)
Sodium: 134 mmol/L — ABNORMAL LOW (ref 135–145)

## 2021-04-24 LAB — CBC
HCT: 39.5 % (ref 36.0–46.0)
Hemoglobin: 13.4 g/dL (ref 12.0–15.0)
MCH: 32.1 pg (ref 26.0–34.0)
MCHC: 33.9 g/dL (ref 30.0–36.0)
MCV: 94.5 fL (ref 80.0–100.0)
Platelets: 180 10*3/uL (ref 150–400)
RBC: 4.18 MIL/uL (ref 3.87–5.11)
RDW: 13.5 % (ref 11.5–15.5)
WBC: 6.2 10*3/uL (ref 4.0–10.5)
nRBC: 0 % (ref 0.0–0.2)

## 2021-04-24 LAB — LIPASE, BLOOD: Lipase: 33 U/L (ref 11–51)

## 2021-04-24 LAB — ETHANOL: Alcohol, Ethyl (B): <10 mg/dL (ref <10)

## 2021-04-24 LAB — RESP PANEL BY RT-PCR (FLU A&B, COVID) ARPGX2
Influenza A by PCR: NEGATIVE
Influenza B by PCR: NEGATIVE
SARS Coronavirus 2 by RT PCR: NEGATIVE

## 2021-04-24 LAB — GLUCOSE, CAPILLARY
Glucose-Capillary: 178 mg/dL — ABNORMAL HIGH (ref 70–99)
Glucose-Capillary: 151 mg/dL — ABNORMAL HIGH (ref 70–99)

## 2021-04-24 LAB — HEMOGLOBIN A1C
Hgb A1c MFr Bld: 5.9 % — ABNORMAL HIGH (ref 4.8–5.6)
Mean Plasma Glucose: 122.63 mg/dL

## 2021-04-24 MED ORDER — PANTOPRAZOLE SODIUM 40 MG PO TBEC
40.0000 mg | DELAYED_RELEASE_TABLET | Freq: Every day | ORAL | Status: DC
  Administered 2021-04-26 – 2021-04-28 (×3): 40 mg via ORAL
  Filled 2021-04-24 (×3): qty 1

## 2021-04-24 MED ORDER — ONDANSETRON HCL 4 MG/2ML IJ SOLN
4.0000 mg | Freq: Once | INTRAMUSCULAR | Status: AC
  Administered 2021-04-24: 4 mg via INTRAVENOUS
  Filled 2021-04-24: qty 2

## 2021-04-24 MED ORDER — TRIAMTERENE-HCTZ 75-50 MG PO TABS
0.5000 | ORAL_TABLET | Freq: Every day | ORAL | Status: DC
  Filled 2021-04-24: qty 0.5

## 2021-04-24 MED ORDER — LEVOTHYROXINE SODIUM 25 MCG PO TABS
125.0000 ug | ORAL_TABLET | Freq: Every day | ORAL | Status: DC
  Administered 2021-04-26 – 2021-04-28 (×3): 125 ug via ORAL
  Filled 2021-04-24 (×4): qty 1

## 2021-04-24 MED ORDER — ONDANSETRON 4 MG PO TBDP
4.0000 mg | ORAL_TABLET | ORAL | Status: DC | PRN
  Administered 2021-04-24 – 2021-04-25 (×3): 4 mg via ORAL
  Filled 2021-04-24 (×3): qty 1

## 2021-04-24 MED ORDER — CLOPIDOGREL BISULFATE 75 MG PO TABS
75.0000 mg | ORAL_TABLET | Freq: Every day | ORAL | Status: DC
  Administered 2021-04-25 – 2021-04-28 (×4): 75 mg via ORAL
  Filled 2021-04-24 (×5): qty 1

## 2021-04-24 MED ORDER — MECLIZINE HCL 25 MG PO TABS
25.0000 mg | ORAL_TABLET | Freq: Three times a day (TID) | ORAL | Status: DC | PRN
  Administered 2021-04-24 – 2021-04-25 (×2): 25 mg via ORAL
  Filled 2021-04-24 (×4): qty 1

## 2021-04-24 MED ORDER — ONDANSETRON 4 MG PO TBDP: 4.0000 mg | ORAL_TABLET | Freq: Two times a day (BID) | ORAL | Status: DC

## 2021-04-24 MED ORDER — ACETAMINOPHEN 325 MG PO TABS
650.0000 mg | ORAL_TABLET | Freq: Four times a day (QID) | ORAL | Status: DC | PRN
  Administered 2021-04-25 – 2021-04-26 (×2): 650 mg via ORAL
  Filled 2021-04-24 (×2): qty 2

## 2021-04-24 MED ORDER — ASPIRIN 81 MG PO CHEW
81.0000 mg | CHEWABLE_TABLET | Freq: Every day | ORAL | Status: DC
Start: 2021-04-25 — End: 2021-04-28
  Administered 2021-04-25 – 2021-04-28 (×4): 81 mg via ORAL
  Filled 2021-04-24 (×4): qty 1

## 2021-04-24 MED ORDER — PRAVASTATIN SODIUM 20 MG PO TABS
40.0000 mg | ORAL_TABLET | Freq: Every evening | ORAL | Status: DC
  Filled 2021-04-24 (×2): qty 2

## 2021-04-24 MED ORDER — METOPROLOL TARTRATE 50 MG PO TABS
100.0000 mg | ORAL_TABLET | Freq: Two times a day (BID) | ORAL | Status: DC
  Administered 2021-04-26 – 2021-04-28 (×5): 100 mg via ORAL
  Filled 2021-04-24 (×6): qty 2

## 2021-04-24 MED ORDER — SODIUM CHLORIDE 0.9% FLUSH
3.0000 mL | Freq: Once | INTRAVENOUS | Status: AC
  Administered 2021-04-24: 3 mL via INTRAVENOUS

## 2021-04-24 MED ORDER — ENOXAPARIN SODIUM 40 MG/0.4ML IJ SOSY
40.0000 mg | PREFILLED_SYRINGE | INTRAMUSCULAR | Status: DC
  Administered 2021-04-24 – 2021-04-27 (×4): 40 mg via SUBCUTANEOUS
  Filled 2021-04-24 (×4): qty 0.4

## 2021-04-24 MED ORDER — ASPIRIN 81 MG PO CHEW
324.0000 mg | CHEWABLE_TABLET | Freq: Once | ORAL | Status: AC
  Administered 2021-04-24: 324 mg via ORAL
  Filled 2021-04-24: qty 4

## 2021-04-24 MED ORDER — POTASSIUM CHLORIDE IN NACL 20-0.45 MEQ/L-% IV SOLN
INTRAVENOUS | Status: DC
  Filled 2021-04-24: qty 1000

## 2021-04-24 MED ORDER — LACTATED RINGERS IV BOLUS
1000.0000 mL | Freq: Once | INTRAVENOUS | Status: AC
  Administered 2021-04-24: 1000 mL via INTRAVENOUS

## 2021-04-24 MED ORDER — NORTRIPTYLINE HCL 10 MG PO CAPS
10.0000 mg | ORAL_CAPSULE | Freq: Every day | ORAL | Status: DC
  Administered 2021-04-25 – 2021-04-27 (×3): 10 mg via ORAL
  Filled 2021-04-24 (×4): qty 1

## 2021-04-24 MED ORDER — DOCUSATE SODIUM 100 MG PO CAPS
100.0000 mg | ORAL_CAPSULE | Freq: Every day | ORAL | Status: DC
  Administered 2021-04-26 – 2021-04-28 (×3): 100 mg via ORAL
  Filled 2021-04-24 (×3): qty 1

## 2021-04-24 MED ORDER — INSULIN ASPART 100 UNIT/ML IJ SOLN
0.0000 [IU] | Freq: Three times a day (TID) | INTRAMUSCULAR | Status: DC
  Administered 2021-04-26: 2 [IU] via SUBCUTANEOUS
  Administered 2021-04-26: 3 [IU] via SUBCUTANEOUS
  Administered 2021-04-26: 2 [IU] via SUBCUTANEOUS
  Filled 2021-04-24 (×3): qty 1

## 2021-04-24 MED ORDER — IOHEXOL 350 MG/ML SOLN
100.0000 mL | Freq: Once | INTRAVENOUS | Status: AC | PRN
  Administered 2021-04-24: 100 mL via INTRAVENOUS

## 2021-04-24 MED ORDER — SENNA 8.6 MG PO TABS
1.0000 | ORAL_TABLET | Freq: Two times a day (BID) | ORAL | Status: DC
  Administered 2021-04-25 – 2021-04-28 (×6): 8.6 mg via ORAL
  Filled 2021-04-24 (×7): qty 1

## 2021-04-24 MED ORDER — METFORMIN HCL 500 MG PO TABS
1000.0000 mg | ORAL_TABLET | Freq: Two times a day (BID) | ORAL | Status: DC
  Administered 2021-04-24: 1000 mg via ORAL
  Filled 2021-04-24: qty 2

## 2021-04-24 NOTE — ED Provider Notes (Signed)
? ?[facility] ?Provider Note ? ? ? Event Date/Time  ? First MD Initiated Contact with Patient 04/24/21 1049   ?  (approximate) ? ? ?History  ? ?Near Syncope (Dizziness, lying on floor , vomiting, no thinners , no hit head ) ? ? ?HPI ? ?Christie Bryant is a 66 y.o. female who presents to the ED for evaluation of Near Syncope (Dizziness, lying on floor , vomiting, no thinners , no hit head ) ?  ?I reviewed PCP visit from 1/24.  Obese patient with history of DM, HTN, HLD.  No anticoagulation. ? ?Patient presents to the ED from home for evaluation of vertiginous dizziness.  History is initially difficult to clearly given the patient as she is quite symptomatic and miserable.  Sounds like she had sudden onset vertiginous dizziness this morning at 930a, causing her to fall to the ground and has been symptomatic constantly since that time.  No syncope and did not cause any significant trauma or striking her head. ? ? ?Physical Exam  ? ?Triage Vital Signs: ?ED Triage Vitals  ?Enc Vitals Group  ?   BP 04/24/21 1053 (!) 180/82  ?   Pulse Rate 04/24/21 1047 90  ?   Resp 04/24/21 1047 17  ?   Temp 04/24/21 1047 98.5 ?F (36.9 ?C)  ?   Temp Source 04/24/21 1047 Oral  ?   SpO2 04/24/21 1047 98 %  ?   Weight 04/24/21 1050 209 lb 7 oz (95 kg)  ?   Height 04/24/21 1050 5\' 8"  (1.727 m)  ?   Head Circumference --   ?   Peak Flow --   ?   Pain Score --   ?   Pain Loc --   ?   Pain Edu? --   ?   Excl. in GC? --   ? ? ?Most recent vital signs: ?Vitals:  ? 04/24/21 1047 04/24/21 1053  ?BP:  (!) 180/82  ?Pulse: 90   ?Resp: 17   ?Temp: 98.5 ?F (36.9 ?C)   ?SpO2: 98%   ? ? ?General: Awake, no distress.  Obese.  Lying on her right side and clearly uncomfortable.  Emesis bag in hand. ?CV:  Good peripheral perfusion.  ?Resp:  Normal effort.  ?Abd:  No distention.  ?MSK:  No deformity noted.  ?Neuro:  Difficult to get her to fully participate in examination as she is reticent to lay on her back ?Other:   ? ? ?ED Results /  Procedures / Treatments  ? ?Labs ?(all labs ordered are listed, but only abnormal results are displayed) ?Labs Reviewed  ?BASIC METABOLIC PANEL - Abnormal; Notable for the following components:  ?    Result Value  ? Sodium 134 (*)   ? Potassium 3.0 (*)   ? Glucose, Bld 213 (*)   ? Creatinine, Ser 1.05 (*)   ? GFR, Estimated 58 (*)   ? All other components within normal limits  ?HEPATIC FUNCTION PANEL - Abnormal; Notable for the following components:  ? Total Bilirubin 1.3 (*)   ? Indirect Bilirubin 1.1 (*)   ? All other components within normal limits  ?RESP PANEL BY RT-PCR (FLU A&B, COVID) ARPGX2  ?CBC  ?LIPASE, BLOOD  ?URINALYSIS, ROUTINE W REFLEX MICROSCOPIC  ?ETHANOL  ?PROTIME-INR  ?APTT  ?URINE DRUG SCREEN, QUALITATIVE ([facility] ONLY)  ? ? ?EKG ?Sinus rhythm, rate of 90 bpm.  Normal axis and evidence of incomplete left bundle.  No STEMI. ? ?RADIOLOGY ?CT head  reviewed by me without evidence of acute intracranial pathology ? ?Official radiology report(s): ?CT HEAD WO CONTRAST ( ) ? ?Result Date: 04/24/2021 ?CLINICAL DATA:  Provided history: New vertigo. Additional history provided: Code stroke. Dizziness. Lying on floor. Vomiting. EXAM: CT HEAD WITHOUT CONTRAST TECHNIQUE: Contiguous axial images were obtained from the base of the skull through the vertex without intravenous contrast. RADIATION DOSE REDUCTION: This exam was performed according to the departmental dose-optimization program which includes automated exposure control, adjustment of the mA and/or kV according to patient size and/or use of iterative reconstruction technique. COMPARISON:  No pertinent prior exams available for comparison. FINDINGS: Brain: Mild generalized cerebral atrophy. Mild patchy and ill-defined hypoattenuation within the cerebral white matter, nonspecific but compatible with chronic small vessel in disease. Partially empty sella turcica. There is no acute intracranial hemorrhage. No demarcated cortical infarct. No extra-axial fluid  collection. No evidence of an intracranial mass. No midline shift. Vascular: No hyperdense vessel.  Atherosclerotic calcifications. Skull: Normal. Negative for fracture or focal lesion. Sinuses/Orbits: Visualized orbits show no acute finding. Mild mucosal thickening within the bilateral maxillary sinuses. Other: Ovoid lesions with irregular internal calcification within the bilateral parietooccipital scalp, likely reflecting sebaceous cysts. ASPECTS ([place] Stroke Program Early CT Score) - Ganglionic level infarction (caudate, lentiform nuclei, internal capsule, insula, M1-M3 cortex): 7 - Supraganglionic infarction (M4-M6 cortex): 3 Total score (0-10 with 10 being normal): 10 These results were communicated to Dr. Selina Cooley At 1:07 pmon 4/13/2023by text page via the [facility] messaging system. IMPRESSION: No evidence of acute intracranial abnormality. Mild chronic small vessel ischemic changes within the cerebral white matter. Mild generalized cerebral atrophy. Electronically Signed   By: Jackey Loge D.O.   On: 04/24/2021 13:07   ? ?PROCEDURES and INTERVENTIONS: ? ?.1-3 Lead EKG Interpretation ?Performed by: Delton Prairie, MD ?Authorized by: Delton Prairie, MD  ? ?  Interpretation: normal   ?  ECG rate:  90 ?  ECG rate assessment: normal   ?  Rhythm: sinus rhythm   ?  Ectopy: none   ?  Conduction: normal   ?.Critical Care ?Performed by: Delton Prairie, MD ?Authorized by: Delton Prairie, MD  ? ?Critical care provider statement:  ?  Critical care time (minutes):  30 ?  Critical care time was exclusive of:  Separately billable procedures and treating other patients ?  Critical care was necessary to treat or prevent imminent or life-threatening deterioration of the following conditions:  CNS failure or compromise ?  Critical care was time spent personally by me on the following activities:  Development of treatment plan with patient or surrogate, discussions with consultants, evaluation of patient's response to treatment,  examination of patient, ordering and review of laboratory studies, ordering and review of radiographic studies, ordering and performing treatments and interventions, pulse oximetry, re-evaluation of patient's condition and review of old charts ? ?Medications  ?sodium chloride flush (NS) 0.9 % injection 3 mL (3 mLs Intravenous Given 04/24/21 1054)  ?ondansetron (ZOFRAN) injection 4 mg (4 mg Intravenous Given 04/24/21 1145)  ?lactated ringers bolus 1,000 mL (0 mLs Intravenous Stopped 04/24/21 1245)  ?iohexol (OMNIPAQUE) 350 MG/ML injection 100 mL (100 mLs Intravenous Contrast Given 04/24/21 1252)  ? ? ? ?IMPRESSION / MDM / ASSESSMENT AND PLAN / ED COURSE  ?I reviewed the triage vital signs and the nursing notes. ? ?66 year old female with metabolic syndrome presents to the ED with sudden onset vertigo concerning for an acute stroke requiring medical admission.  Initially tells me last known normal was 930 this morning,  so stroke alert process is pursued and neurology was consulted emergently.  Later history obtained indicates that her symptoms really started earlier around 2:00 this morning.  Her symptoms are of severe and constant vertigo that she has never felt before.  Mechanical fall due to this and emesis associated with this.  I see no evidence of weakness or neurologic deficits to the extremities, but she is persistently vertiginous and quite symptomatic.  Blood work with hypokalemia, that we will replete orally and IV.  Otherwise normal CBC.  CT of her head without evidence of ICH or signs of CVA.  We will consult with hospitalist for admission. ? ?Clinical Course as of 04/24/21 1327  ?Thu Apr 24, 2021  ?1234 After examining the patient and discussing with family, I paged Dr. Selina Cooley. [DS]  ?1238 I consult with Dr. Selina Cooley who agrees and recommends calling code stroke [DS]  ?1239 0930 [DS]  ?1304 Back from CT/CTA w perf [DS]  ?1319 Discuss with Dr. Selina Cooley who recommends hospitalist stroke workup [DS]  ?  ?Clinical  Course User Index ?[DS] Delton Prairie, MD  ? ? ? ?FINAL CLINICAL IMPRESSION(S) / ED DIAGNOSES  ? ?Final diagnoses:  ?Vertigo  ? ? ? ?Rx / DC Orders  ? ?ED Discharge Orders   ? ? None  ? ?  ? ? ? ?Note:  This document was

## 2021-04-24 NOTE — Progress Notes (Signed)
*  PRELIMINARY RESULTS* ?Echocardiogram ?2D Echocardiogram has been performed. ? ?Sequoyah Counterman, Dorene Sorrow ?04/24/2021, 2:45 PM ?

## 2021-04-24 NOTE — Assessment & Plan Note (Addendum)
--  monitor and replete PRN ?

## 2021-04-24 NOTE — Assessment & Plan Note (Addendum)
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

## 2021-04-24 NOTE — Subjective & Objective (Signed)
Christie Bryant, 66 y/o with h/o DM, HTN, hypothyroidism awoke this AM, stood suddenly and had severe vertigo. She describes the room as spinning and had associated nausea. She attempted to walk to the kitchen, had continued vertigo and fell, requiring assistance to stand. The vertigo , though less, persisted. She denies any prior h/o vertigo, recent ear infections, sinus infections, chest pain, Headache, focal weakness. Due to persistent symptoms she presents to [facility]-ED for evaluation.  ?

## 2021-04-24 NOTE — Assessment & Plan Note (Addendum)
--  cont Lopressor and Maxzide ?

## 2021-04-24 NOTE — ED Triage Notes (Signed)
Dizziness, lying on floor , vomiting, no thinners , no hit head , unknown loc , 4 mg zofran per ems, glucose 172 per ems  ?

## 2021-04-24 NOTE — Progress Notes (Signed)
CODE STROKE- PHARMACY COMMUNICATION ? ? ?Time CODE STROKE called/page received:12:42 ? ?Time response to CODE STROKE was made (in person): 12:50 ? ?Time Stroke Kit retrieved from [facility] (only if needed): outside of window, no TNKase ? ?Name of Provider/Nurse contacted:Stack ? ?Past Medical History:  ?Diagnosis Date  ? Chicken pox   ? Chronic kidney disease   ? Stones  ? Diabetes mellitus 2012  ? Dr. Gabriel Carina  ? Hyperlipidemia   ? Hypertension   ? Neuromuscular disorder ([place])   ? Recurrent boils   ? Thyroid disease   ? ?Prior to Admission medications   ?Medication Sig Start Date End Date Taking? Authorizing Provider  ?acetaminophen (TYLENOL) 325 MG tablet Take 2 tablets (650 mg total) by mouth every 6 (six) hours as needed for mild pain (or Fever >/= 101). 10/10/19   Terrilee Croak, MD  ?Cholecalciferol (VITAMIN D-3 PO) Take 2,000 Units by mouth daily.     [provider]  ?docusate sodium (COLACE) 100 MG capsule Take 100 mg by mouth daily.     [provider]  ?Dulaglutide (TRULICITY) 1.5 ZO/1.0RU SOPN Inject 1.5 mg into the skin once a week. 02/04/21 05/05/21  Valerie Roys, DO  ?Insulin Pen Needle (PEN NEEDLES 3/16") 31G X 5 MM MISC 1 each by Does not apply route once a week. 08/21/20   Johnson, Megan P, DO  ?levothyroxine (SYNTHROID) 125 MCG tablet TAKE 1 TABLET BY MOUTH ONCE DAILY ON  AN  EMPTY  STOMACH  WITH  A  GLASS  OF  WATER  AT  LEAST  30  TO  60  MINUTES  BEFORE  BREAKFAST 06/17/20   [provider]  ?metFORMIN (GLUCOPHAGE) 1000 MG tablet Take 1,000 mg by mouth 2 (two) times daily with a meal.    [provider]  ?metoprolol tartrate (LOPRESSOR) 100 MG tablet Take 1 tablet by mouth 2 (two) times daily. 04/29/20   [provider]  ?nortriptyline (PAMELOR) 10 MG capsule Take 1 capsule (10 mg total) by mouth at bedtime. 02/04/21   Park Liter P, DO  ?omeprazole (PRILOSEC) 20 MG capsule Take 20 mg by mouth daily.    [provider]  ?pravastatin (PRAVACHOL) 40 MG  tablet TAKE ONE TABLET BY MOUTH ONCE DAILY ?Patient taking differently: Take 40 mg by mouth every evening. 05/22/15   Jackolyn Confer, MD  ?triamterene-hydrochlorothiazide (MAXZIDE) 75-50 MG tablet Take 1/2 (one-half) tablet by mouth once daily 05/20/20   [provider]  ? ? ?Vira Blanco ,PharmD ?Clinical Pharmacist  ?04/24/2021  1:01 PM ? ? ?

## 2021-04-24 NOTE — ED Notes (Signed)
Informed RN bed assigned 

## 2021-04-24 NOTE — Assessment & Plan Note (Addendum)
Lst A1C 02/04/21 6.6 %. ?--BG has been within inpatient goal ?--d/c'ed BG checks and SSI ?  ?

## 2021-04-24 NOTE — Assessment & Plan Note (Addendum)
--  cont Synthroid ?

## 2021-04-24 NOTE — Consult Note (Signed)
NEUROLOGY CONSULTATION NOTE  ? ?Date of service: April 24, 2021 ?Patient Name: Christie Bryant ?MRN:  [id number] ?DOB:  Jul 20, 1955 ?Reason for consult: stroke code ?Requesting physician: Dr. Vladimir Crofts ?_ _ _   _ __   _ __ _ _  __ __   _ __   __ _ ? ?History of Present Illness  ? ?66 yo woman with hx CKD, DM2, HTN, HL, thyroid disorder, neuromuscular disorder NOS who presents with severe vertigo. LKW 0200 when she went to bed. Woke up at 0930 and when she tried to get out of bed she noticed severe vertigo, room spinning, nausea/vomiting. NIHSS = 0 but +severe vertigo with vomiting and bilateral horizontal direction changing nystagmus. No ataxia. CT head NAICP. TNK not administered 2/2 presentation outside of the window. CTA no LVO, CTP neg. ?  ?ROS  ? ?Per HPI: all other systems reviewed and are negative ? ?Past History  ? ?I have reviewed the following: ? ?Past Medical History:  ?Diagnosis Date  ? Chicken pox   ? Chronic kidney disease   ? Stones  ? Diabetes mellitus 2012  ? Dr. Gabriel Carina  ? Hyperlipidemia   ? Hypertension   ? Neuromuscular disorder ([place])   ? Recurrent boils   ? Thyroid disease   ? ?Past Surgical History:  ?Procedure Laterality Date  ? BIOPSY THYROID    ? normal, Dr. Gabriel Carina  ? BREAST REDUCTION SURGERY    ? COLONOSCOPY WITH PROPOFOL N/A 08/13/2014  ? Procedure: COLONOSCOPY WITH PROPOFOL;  Surgeon: Hulen Luster, MD;  Location: [facility] ENDOSCOPY;  Service: Gastroenterology;  Laterality: N/A;  ? COLONOSCOPY WITH PROPOFOL N/A 11/12/2014  ? Procedure: COLONOSCOPY WITH PROPOFOL;  Surgeon: Hulen Luster, MD;  Location: [facility] ENDOSCOPY;  Service: Gastroenterology;  Laterality: N/A;  ? COSMETIC SURGERY    ? gluteal abscess drainage  03/17/2013  ? TOTAL ABDOMINAL HYSTERECTOMY W/ BILATERAL SALPINGOOPHORECTOMY  01/13/1996  ? complete  ? TUBAL LIGATION    ? ?Family History  ?Problem Relation Age of Onset  ? Cancer Mother   ?     ovarian/uterus  ? Hyperlipidemia Father   ? Heart disease Father   ? Hypertension Father   ? Cancer  Father   ?     throat  ? Hypertension Sister   ? Hypertension Sister   ? Hypertension Sister   ? Liver disease Sister   ? Heart disease Brother   ? Hypertension Brother   ? Stroke Maternal Grandmother   ? Hypertension Maternal Grandmother   ? Hypertension Maternal Grandfather   ? Pancreatic cancer Maternal Grandfather   ? Stroke Paternal Grandmother   ? Stroke Paternal Grandfather   ? Hypertension Other   ? Heart disease Other   ? Sudden death Other   ? Diabetes Other   ? ?Social History  ? ?Socioeconomic History  ? Marital status: Married  ?  Spouse name: Not on file  ? Number of children: Not on file  ? Years of education: Not on file  ? Highest education level: Not on file  ?Occupational History  ? Not on file  ?Tobacco Use  ? Smoking status: Never  ? Smokeless tobacco: Never  ?Vaping Use  ? Vaping Use: Never used  ?Substance and Sexual Activity  ? Alcohol use: No  ? Drug use: No  ? Sexual activity: Not Currently  ?Other Topics Concern  ? Not on file  ?Social History Narrative  ? Lives in [place], from [place].   ?   ?  Work - Hospice  ? ?Social Determinants of Health  ? ?Financial Resource Strain: Low Risk   ? Difficulty of Paying Living Expenses: Not hard at all  ?Food Insecurity: No Food Insecurity  ? Worried About Programme researcher, broadcasting/film/video in the Last Year: Never true  ? Ran Out of Food in the Last Year: Never true  ?Transportation Needs: No Transportation Needs  ? Lack of Transportation (Medical): No  ? Lack of Transportation (Non-Medical): No  ?Physical Activity: Inactive  ? Days of Exercise per Week: 0 days  ? Minutes of Exercise per Session: 0 min  ?Stress: No Stress Concern Present  ? Feeling of Stress : Not at all  ?Social Connections: Moderately Integrated  ? Frequency of Communication with Friends and Family: More than three times a week  ? Frequency of Social Gatherings with Friends and Family: Once a week  ? Attends Religious Services: More than 4 times per year  ? Active Member of Clubs or  Organizations: No  ? Attends Banker Meetings: Never  ? Marital Status: Married  ? ?No Known Allergies ? ?Medications  ? ?(Not in a hospital admission) ?  ? ?No current facility-administered medications for this encounter. ? ?Current Outpatient Medications:  ?  acetaminophen (TYLENOL) 325 MG tablet, Take 2 tablets (650 mg total) by mouth every 6 (six) hours as needed for mild pain (or Fever >/= 101)., Disp: , Rfl:  ?  Cholecalciferol (VITAMIN D-3 PO), Take 2,000 Units by mouth daily. , Disp: , Rfl:  ?  docusate sodium (COLACE) 100 MG capsule, Take 100 mg by mouth daily. , Disp: , Rfl:  ?  Dulaglutide (TRULICITY) 1.5 MG/0.5ML SOPN, Inject 1.5 mg into the skin once a week., Disp: 6 mL, Rfl: 1 ?  Insulin Pen Needle (PEN NEEDLES 3/16") 31G X 5 MM MISC, 1 each by Does not apply route once a week., Disp: 100 each, Rfl: 12 ?  levothyroxine (SYNTHROID) 125 MCG tablet, TAKE 1 TABLET BY MOUTH ONCE DAILY ON  AN  EMPTY  STOMACH  WITH  A  GLASS  OF  WATER  AT  LEAST  30  TO  60  MINUTES  BEFORE  BREAKFAST, Disp: , Rfl:  ?  metFORMIN (GLUCOPHAGE) 1000 MG tablet, Take 1,000 mg by mouth 2 (two) times daily with a meal., Disp: , Rfl:  ?  metoprolol tartrate (LOPRESSOR) 100 MG tablet, Take 1 tablet by mouth 2 (two) times daily., Disp: , Rfl:  ?  nortriptyline (PAMELOR) 10 MG capsule, Take 1 capsule (10 mg total) by mouth at bedtime., Disp: 90 capsule, Rfl: 1 ?  omeprazole (PRILOSEC) 20 MG capsule, Take 20 mg by mouth daily., Disp: , Rfl:  ?  pravastatin (PRAVACHOL) 40 MG tablet, TAKE ONE TABLET BY MOUTH ONCE DAILY (Patient taking differently: Take 40 mg by mouth every evening.), Disp: 90 tablet, Rfl: 0 ?  triamterene-hydrochlorothiazide (MAXZIDE) 75-50 MG tablet, Take 1/2 (one-half) tablet by mouth once daily, Disp: , Rfl:  ? ?Vitals  ? ?Vitals:  ? 04/24/21 1047 04/24/21 1050 04/24/21 1053  ?BP:   (!) 180/82  ?Pulse: 90    ?Resp: 17    ?Temp: 98.5 ?F (36.9 ?C)    ?TempSrc: Oral    ?SpO2: 98%    ?Weight:  95 kg   ?Height:   5\' 8"  (1.727 m)   ?  ? ?Body mass index is 31.84 kg/m?. ? ?Physical Exam  ? ?Physical Exam ?Gen: A&O x4, NAD ?HEENT: Atraumatic, normocephalic;mucous membranes moist;  oropharynx clear, tongue without atrophy or fasciculations. ?Neck: Supple, trachea midline. ?Resp: CTAB, no w/r/r ?CV: RRR, no m/g/r; nml S1 and S2. 2+ symmetric peripheral pulses. ?Abd: soft/NT/ND; nabs x 4 quad ?Extrem: Nml bulk; no cyanosis, clubbing, or edema. ? ?Neuro: ?*MS: A&O x4. Follows multi-step commands.  ?*Speech: fluid, nondysarthric, able to name and repeat ?*CN:  ?  I: Deferred ?  II,III: PERRLA, VFF by confrontation, optic discs unable to be visualized 2/2 pupillary constriction ?  III,IV,VI: EOMI w/o bilateral horizontal direction-changing nystagmus, no ptosis ?  V: Sensation intact from V1 to V3 to LT ?  VII: Eyelid closure was full.  Smile symmetric. ?  VIII: Hearing intact to voice ?  IX,X: Voice normal, palate elevates symmetrically  ?  XI: SCM/trap 5/5 bilat   ?XII: Tongue protrudes midline, no atrophy or fasciculations  ? ?*Motor:   Normal bulk.  No tremor, rigidity or bradykinesia. No pronator drift. ? ?  Strength: Dlt Bic Tri WrE WrF FgS Gr HF KnF KnE PlF DoF  ?  Left 5 5 5 5 5 5 5 5 5 5 5 5   ?  Right 5 5 5 5 5 5 5 5 5 5 5 5   ? ? ?*Sensory: Intact to light touch, pinprick, temperature vibration throughout. Symmetric. Propioception intact bilat.  No double-simultaneous extinction.  ?*Coordination:  Finger-to-nose, heel-to-shin, rapid alternating motions were intact. ?*Reflexes:  2+ and symmetric throughout without clonus; toes down-going bilat ?*Gait: normal base, normal stride, normal turn. Negative Romberg. ? ?NIHSS = 0 ? ? ?Premorbid mRS = 0 ? ? ?Labs  ? ?CBC:  ?Recent Labs  ?Lab 04/24/21 ?1051  ?WBC 6.2  ?HGB 13.4  ?HCT 39.5  ?MCV 94.5  ?PLT 180  ? ? ?Basic Metabolic Panel:  ?Lab Results  ?Component Value Date  ? NA 134 (L) 04/24/2021  ? K 3.0 (L) 04/24/2021  ? CO2 23 04/24/2021  ? GLUCOSE 213 (H) 04/24/2021  ? BUN 17  04/24/2021  ? CREATININE 1.05 (H) 04/24/2021  ? CALCIUM 9.3 04/24/2021  ? GFRNONAA 58 (L) 04/24/2021  ? GFRAA >60 10/09/2019  ? ?Lipid Panel:  ?Lab Results  ?Component Value Date  ? LDLCALC 132 (H) 11/20/2013  ?

## 2021-04-24 NOTE — Progress Notes (Signed)
?   04/24/21 1255  ?Clinical Encounter Type  ?Visited With Family;Patient not available;Health care provider  ?Visit Type Initial;Code  ?Spiritual Encounters  ?Spiritual Needs Other (Comment) ?(social support)  ? ?Chaplain Burris checked on family well-being while Pt taken to CT. Spouse indicated concern for falling blood sugar. Chaplain Burris obtained a drink and crackers and offered family non-anxious, compassionate presence. ? ?Assessed any further needs; none at this time but made family aware of spiritual care availability at all hours. ?

## 2021-04-24 NOTE — ED Triage Notes (Signed)
No loc per patient  ?

## 2021-04-24 NOTE — H&P (Signed)
?History and Physical  ? ? ?Christie Bryant [id number] DOB: 03/19/1955 DOA: 04/24/2021 ? ?DOS: the patient was seen and examined on 04/24/2021 ? ?PCP: Christie Bryant, Christie Bryant, Bryant  ? ?Patient coming from: Home ? ?I have personally briefly reviewed [facility] ? ?[name], 66 y/o with h/o DM, HTN, hypothyroidism awoke this AM, stood suddenly and had severe vertigo. She describes the room as spinning and had associated nausea. She attempted to walk to the kitchen, had continued vertigo and fell, requiring assistance to stand. The vertigo , though less, persisted. She denies any prior h/o vertigo, recent ear infections, sinus infections, chest pain, Headache, focal weakness. Due to persistent symptoms she presents to [facility]ED for evaluation.   ? ?ED Course: T 98  155/87  93  176. Pertinent Lab: K 3.0, Glucose 213, Cr 1.05, CBCD nl, Neurology saw patient in ED - not candidate for thrombolytics. CTA w/o LVO, carotid stenosis. MRI and ECHO done, results pending. TRH called to admit for stroke eval.  ? ?Review of Systems:  ?Review of Systems  ?Constitutional: Negative.   ?HENT:  Negative for hearing loss, sinus pain and tinnitus.   ?Eyes:  Positive for photophobia. Negative for blurred vision, double vision and pain.  ?Respiratory: Negative.    ?Cardiovascular:  Positive for leg swelling. Negative for chest pain and palpitations.  ?Gastrointestinal:  Positive for nausea and vomiting.  ?Genitourinary: Negative.   ?Musculoskeletal: Negative.   ?Skin: Negative.   ?Neurological:  Positive for dizziness and tingling. Negative for focal weakness.  ?Psychiatric/Behavioral: Negative.    ? ?Past Medical History:  ?Diagnosis Date  ? Chicken pox   ? Chronic kidney disease   ? Stones  ? Diabetes mellitus 2012  ? Christie Bryant  ? Hyperlipidemia   ? Hypertension   ? Neuromuscular disorder ([facility])   ? Recurrent boils   ? Thyroid disease   ? ? ?Past Surgical History:  ?Procedure Laterality Date  ? BIOPSY THYROID    ?  normal, Christie Bryant  ? BREAST REDUCTION SURGERY    ? COLONOSCOPY WITH PROPOFOL N/A 08/13/2014  ? Procedure: COLONOSCOPY WITH PROPOFOL;  Surgeon: Christie CullensPaul Y Oh, Bryant;  [facility];  Service: Gastroenterology;  Laterality: N/A;  ? COLONOSCOPY WITH PROPOFOL N/A 11/12/2014  ? Procedure: COLONOSCOPY WITH PROPOFOL;  Surgeon: Christie CullensPaul Y Oh, Bryant;  [facility];  Service: Gastroenterology;  Laterality: N/A;  ? COSMETIC SURGERY    ? gluteal abscess drainage  03/17/2013  ? TOTAL ABDOMINAL HYSTERECTOMY W/ BILATERAL SALPINGOOPHORECTOMY  01/13/1996  ? complete  ? TUBAL LIGATION    ? ? ?Soc Hx - married 48 years. Two sons, 2 grands, 1 great grand. Homemaker and worked as Airline pilotaccountant. Lives with husband who is 6981 today.  Supportive family. I-ADLs ? ? reports that she has never smoked. She has never used smokeless tobacco. She reports that she does not drink alcohol and does not use drugs. ? ?No Known Allergies ? ?Family History  ?Problem Relation Age of Onset  ? Cancer Mother   ?     ovarian/uterus  ? Hyperlipidemia Father   ? Heart disease Father   ? Hypertension Father   ? Cancer Father   ?     throat  ? Hypertension Sister   ? Hypertension Sister   ? Hypertension Sister   ? Liver disease Sister   ? Heart disease Brother   ? Hypertension Brother   ? Stroke Maternal Grandmother   ? Hypertension Maternal  Grandmother   ? Hypertension Maternal Grandfather   ? Pancreatic cancer Maternal Grandfather   ? Stroke Paternal Grandmother   ? Stroke Paternal Grandfather   ? Hypertension Other   ? Heart disease Other   ? Sudden death Other   ? Diabetes Other   ? ? ?Prior to Admission medications   ?Medication Sig Start Date End Date Taking? Authorizing Provider  ?acetaminophen (TYLENOL) 325 MG tablet Take 2 tablets (650 mg total) by mouth every 6 (six) hours as needed for mild pain (or Fever >/= 101). 10/10/19   Christie Glass, Bryant  ?Cholecalciferol (VITAMIN D-3 PO) Take 2,000 Units by mouth daily.     Provider, Historical, Bryant   ?docusate sodium (COLACE) 100 MG capsule Take 100 mg by mouth daily.     Provider, Historical, Bryant  ?Dulaglutide (TRULICITY) 1.5 MG/0.5ML SOPN Inject 1.5 mg into the skin once a week. 02/04/21 05/05/21  Christie Bryant  ?Insulin Pen Needle (PEN NEEDLES 3/16") 31G X 5 MM MISC 1 each by Does not apply route once a week. 08/21/20   Christie Bryant  ?levothyroxine (SYNTHROID) 125 MCG tablet TAKE 1 TABLET BY MOUTH ONCE DAILY ON  AN  EMPTY  STOMACH  WITH  A  Bryant  OF  WATER  AT  LEAST  30  TO  60  MINUTES  BEFORE  BREAKFAST 06/17/20   Provider, Historical, Bryant  ?metFORMIN (GLUCOPHAGE) 1000 MG tablet Take 1,000 mg by mouth 2 (two) times daily with a meal.    Provider, Historical, Bryant  ?metoprolol tartrate (LOPRESSOR) 100 MG tablet Take 1 tablet by mouth 2 (two) times daily. 04/29/20   Provider, Historical, Bryant  ?nortriptyline (PAMELOR) 10 MG capsule Take 1 capsule (10 mg total) by mouth at bedtime. 02/04/21   Christie Bryant  ?omeprazole (PRILOSEC) 20 MG capsule Take 20 mg by mouth daily.    Provider, Historical, Bryant  ?pravastatin (PRAVACHOL) 40 MG tablet TAKE ONE TABLET BY MOUTH ONCE DAILY ?Patient taking differently: Take 40 mg by mouth every evening. 05/22/15   Christie Bryant  ?triamterene-hydrochlorothiazide (MAXZIDE) 75-50 MG tablet Take 1/2 (one-half) tablet by mouth once daily 05/20/20   Provider, Historical, Bryant  ? ? ?Physical Exam: ?Vitals:  ? 04/24/21 1047 04/24/21 1050 04/24/21 1053 04/24/21 1435  ?BP:   (!) 180/82 (!) 155/87  ?Pulse: 90   93  ?Resp: 17   17  ?Temp: 98.5 ?F (36.9 ?C)   98 ?F (36.7 ?C)  ?TempSrc: Oral   Oral  ?SpO2: 98%   92%  ?Weight:  95 kg    ?Height:  5\' 8"  (1.727 m)    ? ? ?Physical Exam ?Vitals and nursing note reviewed.  ?Constitutional:   ?   Appearance: Normal appearance. She is obese.  ?HENT:  ?   Head: Normocephalic and atraumatic.  ?   Right Ear: External ear normal.  ?   Left Ear: External ear normal.  ?   Nose: Nose normal.  ?   Mouth/Throat:  ?   Mouth: Mucous membranes  are moist.  ?   Pharynx: Oropharynx is clear.  ?Eyes:  ?   Extraocular Movements: Extraocular movements intact.  ?   Pupils: Pupils are equal, round, and reactive to light.  ?   Comments: Neuro reports lateal nystagmus - not seen by this examiner  ?Neck:  ?   Vascular: No carotid bruit.  ?Cardiovascular:  ?   Rate and Rhythm: Regular rhythm. Tachycardia present.  ?  Pulses: Normal pulses.  ?   Heart sounds: Normal heart sounds.  ?Pulmonary:  ?   Effort: Pulmonary effort is normal.  ?   Breath sounds: Normal breath sounds.  ?Abdominal:  ?   Palpations: Abdomen is soft.  ?   Tenderness: There is no abdominal tenderness. There is no guarding.  ?Musculoskeletal:     ?   General: Normal range of motion.  ?   Cervical back: Normal range of motion and neck supple.  ?   Comments: Wears knee high support hose 2/2 peripheral neuropathy and venous insufficiency  ?Skin: ?   General: Skin is warm and dry.  ?Neurological:  ?   Mental Status: She is alert and oriented to person, place, and time.  ?   Comments: CN - PERRLA EOMI, nl facial symmetry and movement, nl brow wrinkle. ?MS - 5/5 UE and LE extremity ?DTRs - 2+ radial, biceps, 1+ patella ?Sensation - decreased distal LE ?  ?Psychiatric:     ?   Mood and Affect: Mood normal.     ?   Behavior: Behavior normal.  ?  ? ?Labs on Admission: I have personally reviewed following labs and imaging studies ? ?CBC: ?Recent Labs  ?Lab 04/24/21 ?1051  ?WBC 6.2  ?HGB 13.4  ?HCT 39.5  ?MCV 94.5  ?PLT 180  ? ?Basic Metabolic Panel: ?Recent Labs  ?Lab 04/24/21 ?1051  ?NA 134*  ?K 3.0*  ?CL 102  ?CO2 23  ?GLUCOSE 213*  ?BUN 17  ?CREATININE 1.05*  ?CALCIUM 9.3  ? ?GFR: ?Estimated Creatinine Clearance: 62.6 mL/min (A) (by C-G formula based on SCr of 1.05 mg/dL (H)). ?Liver Function Tests: ?Recent Labs  ?Lab 04/24/21 ?1051  ?AST 27  ?ALT 22  ?ALKPHOS 51  ?BILITOT 1.3*  ?PROT 7.4  ?ALBUMIN 3.9  ? ?Recent Labs  ?Lab 04/24/21 ?1051  ?LIPASE 33  ? ?No results for input(s): AMMONIA in the last 168  hours. ?Coagulation Profile: ?Recent Labs  ?Lab 04/24/21 ?1051  ?INR 1.1  ? ?Cardiac Enzymes: ?No results for input(s): CKTOTAL, CKMB, CKMBINDEX, TROPONINI in the last 168 hours. ?BNP (last 3 results) ?No results fo

## 2021-04-25 DIAGNOSIS — I63542 Cerebral infarction due to unspecified occlusion or stenosis of left cerebellar artery: Secondary | ICD-10-CM | POA: Diagnosis present

## 2021-04-25 DIAGNOSIS — E785 Hyperlipidemia, unspecified: Secondary | ICD-10-CM | POA: Diagnosis present

## 2021-04-25 DIAGNOSIS — I639 Cerebral infarction, unspecified: Secondary | ICD-10-CM | POA: Diagnosis not present

## 2021-04-25 DIAGNOSIS — R29700 NIHSS score 0: Secondary | ICD-10-CM | POA: Diagnosis present

## 2021-04-25 DIAGNOSIS — I10 Essential (primary) hypertension: Secondary | ICD-10-CM | POA: Diagnosis not present

## 2021-04-25 DIAGNOSIS — Z800 Family history of malignant neoplasm of digestive organs: Secondary | ICD-10-CM | POA: Diagnosis not present

## 2021-04-25 DIAGNOSIS — E1142 Type 2 diabetes mellitus with diabetic polyneuropathy: Secondary | ICD-10-CM

## 2021-04-25 DIAGNOSIS — E876 Hypokalemia: Secondary | ICD-10-CM | POA: Diagnosis present

## 2021-04-25 DIAGNOSIS — Z6831 Body mass index (BMI) 31.0-31.9, adult: Secondary | ICD-10-CM | POA: Diagnosis not present

## 2021-04-25 DIAGNOSIS — Z833 Family history of diabetes mellitus: Secondary | ICD-10-CM | POA: Diagnosis not present

## 2021-04-25 DIAGNOSIS — Z79899 Other long term (current) drug therapy: Secondary | ICD-10-CM | POA: Diagnosis not present

## 2021-04-25 DIAGNOSIS — E039 Hypothyroidism, unspecified: Secondary | ICD-10-CM

## 2021-04-25 DIAGNOSIS — Z8673 Personal history of transient ischemic attack (TIA), and cerebral infarction without residual deficits: Secondary | ICD-10-CM

## 2021-04-25 DIAGNOSIS — Z7984 Long term (current) use of oral hypoglycemic drugs: Secondary | ICD-10-CM | POA: Diagnosis not present

## 2021-04-25 DIAGNOSIS — E669 Obesity, unspecified: Secondary | ICD-10-CM | POA: Diagnosis present

## 2021-04-25 DIAGNOSIS — Z8249 Family history of ischemic heart disease and other diseases of the circulatory system: Secondary | ICD-10-CM | POA: Diagnosis not present

## 2021-04-25 DIAGNOSIS — Z90710 Acquired absence of both cervix and uterus: Secondary | ICD-10-CM | POA: Diagnosis not present

## 2021-04-25 DIAGNOSIS — Z823 Family history of stroke: Secondary | ICD-10-CM | POA: Diagnosis not present

## 2021-04-25 DIAGNOSIS — Z794 Long term (current) use of insulin: Secondary | ICD-10-CM | POA: Diagnosis not present

## 2021-04-25 DIAGNOSIS — Z20822 Contact with and (suspected) exposure to covid-19: Secondary | ICD-10-CM | POA: Diagnosis present

## 2021-04-25 DIAGNOSIS — R42 Dizziness and giddiness: Secondary | ICD-10-CM | POA: Diagnosis present

## 2021-04-25 LAB — URINE DRUG SCREEN, QUALITATIVE (ARMC ONLY)
Amphetamines, Ur Screen: NOT DETECTED
Barbiturates, Ur Screen: NOT DETECTED
Benzodiazepine, Ur Scrn: NOT DETECTED
Cannabinoid 50 Ng, Ur ~~LOC~~: NOT DETECTED
Cocaine Metabolite,Ur ~~LOC~~: NOT DETECTED
MDMA (Ecstasy)Ur Screen: NOT DETECTED
Methadone Scn, Ur: NOT DETECTED
Opiate, Ur Screen: NOT DETECTED
Phencyclidine (PCP) Ur S: NOT DETECTED
Tricyclic, Ur Screen: POSITIVE — AB

## 2021-04-25 LAB — URINALYSIS, ROUTINE W REFLEX MICROSCOPIC
Bacteria, UA: NONE SEEN
Bilirubin Urine: NEGATIVE
Glucose, UA: 50 mg/dL — AB
Hgb urine dipstick: NEGATIVE
Ketones, ur: NEGATIVE mg/dL
Nitrite: NEGATIVE
Protein, ur: NEGATIVE mg/dL
Specific Gravity, Urine: 1.04 — ABNORMAL HIGH (ref 1.005–1.030)
pH: 6 (ref 5.0–8.0)

## 2021-04-25 LAB — BASIC METABOLIC PANEL
Anion gap: 9 (ref 5–15)
BUN: 13 mg/dL (ref 8–23)
CO2: 26 mmol/L (ref 22–32)
Calcium: 9.3 mg/dL (ref 8.9–10.3)
Chloride: 102 mmol/L (ref 98–111)
Creatinine, Ser: 1.01 mg/dL — ABNORMAL HIGH (ref 0.44–1.00)
GFR, Estimated: >60 mL/min (ref >60)
Glucose, Bld: 108 mg/dL — ABNORMAL HIGH (ref 70–99)
Potassium: 3.4 mmol/L — ABNORMAL LOW (ref 3.5–5.1)
Sodium: 137 mmol/L (ref 135–145)

## 2021-04-25 LAB — GLUCOSE, CAPILLARY
Glucose-Capillary: 111 mg/dL — ABNORMAL HIGH (ref 70–99)
Glucose-Capillary: 131 mg/dL — ABNORMAL HIGH (ref 70–99)
Glucose-Capillary: 148 mg/dL — ABNORMAL HIGH (ref 70–99)
Glucose-Capillary: 123 mg/dL — ABNORMAL HIGH (ref 70–99)
Glucose-Capillary: 114 mg/dL — ABNORMAL HIGH (ref 70–99)

## 2021-04-25 LAB — LDL CHOLESTEROL, DIRECT: Direct LDL: 104.5 mg/dL — ABNORMAL HIGH (ref 0–99)

## 2021-04-25 MED ORDER — ATORVASTATIN CALCIUM 20 MG PO TABS
80.0000 mg | ORAL_TABLET | Freq: Every day | ORAL | Status: DC
  Administered 2021-04-25 – 2021-04-27 (×3): 80 mg via ORAL
  Filled 2021-04-25 (×3): qty 4

## 2021-04-25 MED ORDER — METOCLOPRAMIDE HCL 5 MG/ML IJ SOLN
10.0000 mg | Freq: Four times a day (QID) | INTRAMUSCULAR | Status: DC
  Administered 2021-04-25 – 2021-04-26 (×5): 10 mg via INTRAVENOUS
  Filled 2021-04-25 (×5): qty 2

## 2021-04-25 MED ORDER — TRIAMTERENE-HCTZ 37.5-25 MG PO TABS
1.0000 | ORAL_TABLET | Freq: Every day | ORAL | Status: DC
  Administered 2021-04-26 – 2021-04-28 (×3): 1 via ORAL
  Filled 2021-04-25 (×4): qty 1

## 2021-04-25 MED ORDER — TRIAMTERENE-HCTZ 37.5-25 MG PO TABS
2.0000 | ORAL_TABLET | Freq: Every day | ORAL | Status: DC
  Filled 2021-04-25: qty 2

## 2021-04-25 MED ORDER — MECLIZINE HCL 25 MG PO TABS
25.0000 mg | ORAL_TABLET | Freq: Three times a day (TID) | ORAL | Status: DC
  Administered 2021-04-25 – 2021-04-28 (×9): 25 mg via ORAL
  Filled 2021-04-25 (×9): qty 1

## 2021-04-25 NOTE — Progress Notes (Signed)
[facility]  -  at [facility] ? ? ?PATIENT NAME: Christie Bryant   ? ?MR#:  [id number] ? ?DATE OF BIRTH:  1955-04-22 ? ?SUBJECTIVE:  ? ?Pt came i in with acute onset dizziness and unable to maintain balance. Complains of severe nausea ?daughter-in-law at bedside. 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 ? ? ?VITALS:  ?Blood pressure (!) 161/72, pulse 88, temperature 98.4 ?F (36.9 ?C), resp. rate 15, height 5\' 8"  (1.727 m), weight 95 kg, SpO2 95 %. ? ?PHYSICAL EXAMINATION:  ? ?GENERAL:  65 y.o.-year-old patient lying in the bed with no acute distress.  ?LUNGS: Normal breath sounds bilaterally, no wheezing, rales, rhonchi.  ?CARDIOVASCULAR: S1, S2 normal. No murmurs, rubs, or gallops.  ?ABDOMEN: Soft, nontender, nondistended. Bowel sounds present.  ?EXTREMITIES: No  edema b/l.    ?NEUROLOGIC: nonfocal  patient is alert and awake ?SKIN: No obvious rash, lesion, or ulcer.  ? ?LABORATORY PANEL:  ?CBC ?Recent Labs  ?Lab 04/24/21 ?1051  ?WBC 6.2  ?HGB 13.4  ?HCT 39.5  ?PLT 180  ? ? ?Chemistries  ?Recent Labs  ?Lab 04/24/21 ?1051 04/25/21 ?0428  ?NA 134* 137  ?K 3.0* 3.4*  ?CL 102 102  ?CO2 23 26  ?GLUCOSE 213* 108*  ?BUN 17 13  ?CREATININE 1.05* 1.01*  ?CALCIUM 9.3 9.3  ?AST 27  --   ?ALT 22  --   ?ALKPHOS 51  --   ?BILITOT 1.3*  --   ? ?Cardiac Enzymes ?No results for input(s): TROPONINI in the last 168 hours. ?RADIOLOGY:  ?CT HEAD WO CONTRAST (04/27/21) ? ?Result Date: 04/24/2021 ?CLINICAL DATA:  Provided history: New vertigo. Additional history provided: Code stroke. Dizziness. Lying on floor. Vomiting. EXAM: CT HEAD WITHOUT CONTRAST TECHNIQUE: Contiguous axial images were obtained from the base of the skull through the vertex without intravenous contrast. RADIATION DOSE REDUCTION: This exam was performed according to the departmental dose-optimization program which includes automated exposure control, adjustment of the mA and/or kV according to patient size and/or  use of iterative reconstruction technique. COMPARISON:  No pertinent prior exams available for comparison. FINDINGS: Brain: Mild generalized cerebral atrophy. Mild patchy and ill-defined hypoattenuation within the cerebral white matter, nonspecific but compatible with chronic small vessel in disease. Partially empty sella turcica. There is no acute intracranial hemorrhage. No demarcated cortical infarct. No extra-axial fluid collection. No evidence of an intracranial mass. No midline shift. Vascular: No hyperdense vessel.  Atherosclerotic calcifications. Skull: Normal. Negative for fracture or focal lesion. Sinuses/Orbits: Visualized orbits show no acute finding. Mild mucosal thickening within the bilateral maxillary sinuses. Other: Ovoid lesions with irregular internal calcification within the bilateral parietooccipital scalp, likely reflecting sebaceous cysts. ASPECTS (Alberta Stroke Program Early CT Score) - Ganglionic level infarction (caudate, lentiform nuclei, internal capsule, insula, M1-M3 cortex): 7 - Supraganglionic infarction (M4-M6 cortex): 3 Total score (0-10 with 10 being normal): 10 These results were communicated to Dr. 04/26/2021 At 1:07 pmon 4/13/2023by text page via the [facility] messaging system. IMPRESSION: No evidence of acute intracranial abnormality. Mild chronic small vessel ischemic changes within the cerebral white matter. Mild generalized cerebral atrophy. Electronically Signed   By: [facility] D.O.   On: 04/24/2021 13:07  ? ?MR BRAIN WO CONTRAST ? ?Result Date: 04/24/2021 ?CLINICAL DATA:  Neuro deficit, acute, stroke suspected vertigo EXAM: MRI HEAD WITHOUT CONTRAST TECHNIQUE: Multiplanar, multiecho pulse sequences of the brain and surrounding structures were obtained without intravenous contrast. COMPARISON:  Same day CTA and CT head.  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 Associated edema without significant mass effect. A few additional punctate acute infarcts in the  left parieto-occipital region. There are mild scattered T2/FLAIR hyperintensities elsewhere in the white matter, nonspecific but compatible with chronic microvascular ischemic disease. No acute hemorrhage, mass lesion, midline shift, or hydrocephalus. Vascular: Better evaluated on same day CTA. Skull and upper cervical spine: Normal marrow signal. Sinuses/Orbits: Clear sinuses.  No acute orbital findings. Other: No mastoid effusions. IMPRESSION: 1. 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 Associated edema without significant mass effect. 2. A few additional punctate acute infarcts in the left parieto-occipital region. Electronically Signed   By: Feliberto Harts M.D.   On: 04/24/2021 15:54  ? ?ECHOCARDIOGRAM COMPLETE BUBBLE STUDY ? ?Result Date: 04/24/2021 ?   ECHOCARDIOGRAM REPORT   Patient Name:   Christie Bryant Date of Exam: 04/24/2021 Medical Rec #:  [id number]    Height:       68.0 in Accession #:    [id number]   Weight:       209.4 lb Date of Birth:  08-25-1955    BSA:          2.084 m? Patient Age:    65 years     BP:           155/87 mmHg Patient Gender: F            HR:           93 bpm. Exam Location:  [facility] Procedure: 2D Echo, Cardiac Doppler, Color Doppler and Saline Contrast Bubble            Study Indications:     Stroke 434.91 / I63.9  History:         Patient has no prior history of Echocardiogram examinations.                  Risk Factors:Diabetes and Hypertension. CKD.  Sonographer:     Cristela Blue Referring Phys:  [id number] Malachi Carl STACK Diagnosing Phys: Sena Slate  Sonographer Comments: Suboptimal apical window and no subcostal window. IMPRESSIONS  1. Left ventricular ejection fraction, by estimation, is 60 to 65%. The left ventricle has normal function. The left ventricle has no regional wall motion abnormalities. Left ventricular diastolic parameters were normal.  2. Right ventricular systolic function is normal. The right ventricular size is normal.  3. The mitral valve is grossly normal. No  evidence of mitral valve regurgitation.  4. The aortic valve is grossly normal. Aortic valve regurgitation is not visualized. No aortic stenosis is present.  5. Agitated saline contrast bubble study was negative, with no evidence of any interatrial shunt. Conclusion(s)/Recommendation(s): TECHNICALLY DIFFICULT STUDY. FINDINGS  Left Ventricle: Left ventricular ejection fraction, by estimation, is 60 to 65%. The left ventricle has normal function. The left ventricle has no regional wall motion abnormalities. The left ventricular internal cavity size was normal in size. There is  borderline left ventricular hypertrophy. Left ventricular diastolic parameters were normal. Right Ventricle: The right ventricular size is normal. Right vetricular wall thickness was not well visualized. Right ventricular systolic function is normal. Left Atrium: Left atrial size was normal in size. Right Atrium: Right atrial size was normal in size. Pericardium: There is no evidence of pericardial effusion. Mitral Valve: The mitral valve is grossly normal. No evidence of mitral valve regurgitation. Tricuspid Valve: The tricuspid valve is not well visualized. Tricuspid valve regurgitation is not demonstrated. Aortic Valve: The aortic valve is grossly normal.  Aortic valve regurgitation is not visualized. No aortic stenosis is present. Aortic valve mean gradient measures 3.0 mmHg. Aortic valve peak gradient measures 5.4 mmHg. Aortic valve area, by VTI measures 3.40 cm?. Pulmonic Valve: The pulmonic valve was not well visualized. Aorta: The aortic root is normal in size and structure. IAS/Shunts: The interatrial septum was not well visualized. Agitated saline contrast was given intravenously to evaluate for intracardiac shunting. Agitated saline contrast bubble study was negative, with no evidence of any interatrial shunt.  LEFT VENTRICLE PLAX 2D LVIDd:         5.00 cm   Diastology LVIDs:         3.30 cm   LV e' medial:    8.05 cm/s LV PW:          1.20 cm   LV E/e' medial:  7.3 LV IVS:        1.00 cm   LV e' lateral:   9.36 cm/s LVOT diam:     2.10 cm   LV E/e' lateral: 6.3 LV SV:         60 LV SV Index:   29 LVOT Area:     3.46 cm?  RIGHT VENTRIC

## 2021-04-25 NOTE — Plan of Care (Signed)
  Problem: Education: Goal: Knowledge of General Education information will improve Description: Including pain rating scale, medication(s)/side effects and non-pharmacologic comfort measures Outcome: Progressing   Problem: Clinical Measurements: Goal: Respiratory complications will improve Outcome: Progressing Goal: Cardiovascular complication will be avoided Outcome: Progressing   Problem: Safety: Goal: Ability to remain free from injury will improve Outcome: Progressing   

## 2021-04-25 NOTE — Progress Notes (Signed)
PT Cancellation Note ? ?Patient Details ?Name: Janeice Stegall Brindisi ?MRN: [id number] ?DOB: [birth year] ? ? ?Cancelled Treatment:    Reason Eval/Treat Not Completed: Other (comment).  OT just finished with pt and reporting pt refusing OOB at this time d/t dizziness.  Will re-attempt PT evaluation at a later date/time. ? ?Hendricks Limes, PT ?04/25/21, 3:44 PM ? ?

## 2021-04-25 NOTE — Plan of Care (Signed)
Brief Neurology Note ? ?MRI brain - acute infarct L cerebellum with edema but no sig mass effect (personal review) ?TTE - no sig abnl, no intracardiac clot, (-) PFO ? ?Final recommendations: ?- Permissive HTN x48 hrs from sx onset goal BP <220/110. PRN labetalol or hydralazine if BP above these parameters. Avoid oral antihypertensives. After 48 hrs goal normotension, avoid hypotension ?- Atorvastatin 80mg  daily ?- ASA 81mg  daily + plavix 75mg  daily x21 days f/b ASA 81mg  daily monotherapy after that ?- Meclizine, zofran prn for symptomatic relief ?- PT ?- Stroke education ?- I will arrange outpatient neurology f/u ? ?Stroke workup completed. Neuro to sign off, but please re-engage if additional neurologic concerns arise. ? ? , MD ?[facility] ?[phone] ? ?If 7pm- 7am, please page neurology on call as listed in AMION. ? ?

## 2021-04-25 NOTE — Progress Notes (Signed)
PT Cancellation Note ? ?Patient Details ?Name: Christie Bryant ?MRN: [id number] ?DOB: August 13, 1955 ? ? ?Cancelled Treatment:    Reason Eval/Treat Not Completed: Other (comment).  PT consult received.  Chart reviewed.  Upon therapist arrival, pt laying in bed with lights dim.  Pt declining PT and OT (pt reporting that therapy was "unnecessary").  Pt's visitor reports that pt only unable to get up d/t dizziness (any movement makes symptoms worse and makes pt sick) and if that was under control, pt would not have any difficulties getting up and mobilizing.  OT and nursing notified that pt declining therapy services at this time.  Will monitor pt's status at this time. ? ?Hendricks Limes, PT ?04/25/21, 9:37 AM ? ?

## 2021-04-25 NOTE — Evaluation (Signed)
Occupational Therapy Evaluation ?Patient Details ?Name: Christie Bryant ?MRN: [id number] ?DOB: Jan 12, 1956 ?Today's Date: 04/25/2021 ? ? ?History of Present Illness Christie Bryant, 66 y/o with h/o DM, HTN, hypothyroidism awoke this AM, stood suddenly and had severe vertigo. She describes the room as spinning and had associated nausea. She attempted to walk to the kitchen, had continued vertigo and fell, requiring assistance to stand. 04/25/21 MRI brain - acute infarct L cerebellum with edema but no sig mass effect  ? ?Clinical Impression ?  ?Christie Bryant was seen for OT evaluation this date. Prior to hospital admission, pt was Independent in mobility and I/ADLs including caregiver for her husband. Pt lives with spouse in home c 4 STE. Pt presents to acute OT demonstrating impaired ADL performance and functional mobility 2/2 decreased activity tolerance and functional balance deficits. Pt currently requires SETUP self-drinking at bed level. Pt limited by dizziness however has strength/ROM necessary to complete bed level grooming/dressing. Unable to tolerate OOB t/f this session however MOD I rolling at bed level and sup<>long sit, tolerates ~1 min long sitting prior to reporting nausea and returning to supine. Pt would benefit from skilled OT to address noted impairments and functional limitations (see below for any additional details). Upon hospital discharge, recommend AIR pending completion of OOB assessment.  ?   ? ?Recommendations for follow up therapy are one component of a multi-disciplinary discharge planning process, led by the attending physician.  Recommendations may be updated based on patient status, additional functional criteria and insurance authorization.  ? ?Follow Up Recommendations ? Acute inpatient rehab (3hours/day) (pending OOB assessment)  ?  ?Assistance Recommended at Discharge Intermittent Supervision/Assistance  ?Patient can return home with the following A lot of help with walking and/or transfers;A little  help with bathing/dressing/bathroom ? ?  ?Functional Status Assessment ? Patient has had a recent decline in their functional status and demonstrates the ability to make significant improvements in function in a reasonable and predictable amount of time.  ?Equipment Recommendations ? BSC/3in1  ?  ?Recommendations for Other Services   ? ? ?  ?Precautions / Restrictions Precautions ?Precautions: Fall ?Restrictions ?Weight Bearing Restrictions: No  ? ?  ? ?Mobility Bed Mobility ?Overal bed mobility: Modified Independent ?  ?  ?  ?  ?  ?  ?General bed mobility comments: rolling and sup<>long sit ?  ? ?Transfers ?  ?  ?  ?  ?  ?  ?  ?  ?  ?General transfer comment: pt defers citing dizziness ?  ? ?  ?Balance Overall balance assessment: Needs assistance ?Sitting-balance support: No upper extremity supported, Feet supported ?Sitting balance-Leahy Scale: Good ?Sitting balance - Comments: long sitting ?  ?  ?  ?  ?  ?  ?  ?  ?  ?  ?  ?  ?  ?  ?  ?   ? ?ADL either performed or assessed with clinical judgement  ? ?ADL Overall ADL's : Needs assistance/impaired ?  ?  ?  ?  ?  ?  ?  ?  ?  ?  ?  ?  ?  ?  ?  ?  ?  ?  ?  ?General ADL Comments: Pt limited by dizziness however has strength/ROM necessary to complete bed level grooming/dressing. Unable to tolerate ADL t/f this session however MOD I rolling at bed level  ? ? ? ? ?Pertinent Vitals/Pain Pain Assessment ?Pain Assessment: No/denies pain  ? ? ? ?Hand Dominance   ?  ?Extremity/Trunk  Assessment Upper Extremity Assessment ?Upper Extremity Assessment: Overall WFL for tasks assessed ?  ?Lower Extremity Assessment ?Lower Extremity Assessment: Overall WFL for tasks assessed ?  ?  ?  ?Communication Communication ?Communication: No difficulties ?  ?Cognition Arousal/Alertness: Awake/alert ?Behavior During Therapy: Flat affect ?Overall Cognitive Status: Within Functional Limits for tasks assessed ?  ?  ?  ?  ?  ?  ?  ?  ?  ?  ?  ?  ?  ?  ?  ?  ?  ?  ?  ? ?Home Living Family/patient  expects to be discharged to:: Private residence ?Living Arrangements: Spouse/significant other ?Available Help at Discharge: Friend(s);Available PRN/intermittently ?Type of Home: House ?Home Access: Stairs to enter ?Entrance Stairs-Number of Steps: 4 ?Entrance Stairs-Rails: Right ?Home Layout: One level ?  ?  ?Bathroom Shower/Tub: Tub/shower unit ?  ?Bathroom Toilet: Standard ?  ?  ?  ?  ?  ?  ? ?  ?Prior Functioning/Environment Prior Level of Function : Independent/Modified Independent ?  ?  ?  ?  ?  ?  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  ? ?  ?  ?OT Problem List: Decreased activity tolerance;Impaired balance (sitting and/or standing);Decreased safety awareness ?  ?   ?OT Treatment/Interventions: Self-care/ADL training;Therapeutic exercise;Energy conservation;DME and/or AE instruction;Therapeutic activities;Patient/family education;Balance training  ?  ?OT Goals(Current goals can be found in the care plan section) Acute Rehab OT Goals ?Patient Stated Goal: to go home ?OT Goal Formulation: With patient ?Time For Goal Achievement: 05/09/21 ?Potential to Achieve Goals: Good ?ADL Goals ?Pt Will Perform Grooming: with modified independence;sitting ?Pt Will Perform Lower Body Dressing: with modified independence;sitting/lateral leans ?Pt Will Transfer to Toilet: with modified independence;ambulating;bedside commode  ?OT Frequency: Min 4X/week ?  ? ?Co-evaluation   ?  ?  ?  ?  ? ?  ?AM-PAC OT "6 Clicks" Daily Activity     ?Outcome Measure Help from another person eating meals?: A Little ?Help from another person taking care of personal grooming?: A Little ?Help from another person toileting, which includes using toliet, bedpan, or urinal?: A Lot ?Help from another person bathing (including washing, rinsing, drying)?: A Little ?Help from another person to put on and taking off regular upper body  clothing?: A Little ?Help from another person to put on and taking off regular lower body clothing?: A Little ?6 Click Score: 17 ?  ?End of Session   ? ?Activity Tolerance: Other (comment) (limited by dizziness) ?Patient left: in bed;with call bell/phone within reach;with family/visitor present ? ?OT Visit Diagnosis: Other abnormalities of gait and mobility (R26.89);Muscle weakness (generalized) (M62.81)  ?              ?Time: 6314-9702 ?OT Time Calculation (min): 19 min ?Charges:  OT General Charges ?$OT Visit: 1 Visit ?OT Evaluation ?$OT Eval Moderate Complexity: 1 Mod ? ?Kathie Dike, M.S. OTR/L  ?04/25/21, 4:08 PM  ?ascom [phone] ? ?

## 2021-04-25 NOTE — Care Management (Signed)
Patient was not feeling well today, dizzy and unable to participate in PT.  TOC waiting on PT recommendations to determine discharge needs. ?

## 2021-04-25 NOTE — Plan of Care (Signed)
?  Problem: Health Behavior/Discharge Planning: ?Goal: Ability to manage health-related needs will improve ?Outcome: Progressing ?  ?Problem: Clinical Measurements: ?Goal: Will remain free from infection ?Outcome: Progressing ?  ?Problem: Nutrition: ?Goal: Adequate nutrition will be maintained ?Outcome: Progressing ?  ?Problem: Activity: ?Goal: Risk for activity intolerance will decrease ?Outcome: Progressing ?  ?Problem: Coping: ?Goal: Level of anxiety will decrease ?Outcome: Progressing ?  ?

## 2021-04-26 DIAGNOSIS — I639 Cerebral infarction, unspecified: Secondary | ICD-10-CM | POA: Diagnosis not present

## 2021-04-26 LAB — GLUCOSE, CAPILLARY
Glucose-Capillary: 135 mg/dL — ABNORMAL HIGH (ref 70–99)
Glucose-Capillary: 137 mg/dL — ABNORMAL HIGH (ref 70–99)
Glucose-Capillary: 160 mg/dL — ABNORMAL HIGH (ref 70–99)

## 2021-04-26 MED ORDER — POTASSIUM CHLORIDE CRYS ER 20 MEQ PO TBCR
40.0000 meq | EXTENDED_RELEASE_TABLET | Freq: Once | ORAL | Status: AC
  Administered 2021-04-26: 40 meq via ORAL
  Filled 2021-04-26: qty 2

## 2021-04-26 MED ORDER — ONDANSETRON HCL 4 MG/2ML IJ SOLN: 4.0000 mg | Freq: Four times a day (QID) | INTRAMUSCULAR | Status: DC | PRN

## 2021-04-26 NOTE — Evaluation (Signed)
Physical Therapy Evaluation ?Patient Details ?Name: Christie Bryant ?MRN: [id number] ?DOB: 1955-12-08 ?Today's Date: 04/26/2021 ? ?History of Present Illness ? Christie Bryant, 66 y/o with h/o DM, HTN, hypothyroidism awoke this AM, stood suddenly and had severe vertigo. She describes the room as spinning and had associated nausea. She attempted to walk to the kitchen, had continued vertigo and fell, requiring assistance to stand. 04/25/21 MRI brain - acute infarct L cerebellum with edema but no sig mass effect  ?Clinical Impression ? Patient received in bed, dark room. States she is still very dizzy, but cooperative with PT evaluation. She is mod independent with bed mobility. Transfers with min A, ambulated to bathroom and back to recliner 25 feet with min A and reaching out for counters, walls, etc for stabilization. Patient with some nausea after mobility, but no vomiting. She is left in recliner with alarm set, needs met. Patient will continue to benefit from skilled PT while here to improve functional independence and safety with mobility.    ?   ? ?Recommendations for follow up therapy are one component of a multi-disciplinary discharge planning process, led by the attending physician.  Recommendations may be updated based on patient status, additional functional criteria and insurance authorization. ? ?Follow Up Recommendations Acute inpatient rehab (3hours/day) ? ?  ?Assistance Recommended at Discharge Frequent or constant Supervision/Assistance  ?Patient can return home with the following ? A little help with walking and/or transfers;A little help with bathing/dressing/bathroom;Assist for transportation;Help with stairs or ramp for entrance;Assistance with cooking/housework ? ?  ?Equipment Recommendations Rolling walker (2 wheels)  ?Recommendations for Other Services ? Rehab consult  ?  ?Functional Status Assessment Patient has had a recent decline in their functional status and demonstrates the ability to make  significant improvements in function in a reasonable and predictable amount of time.  ? ?  ?Precautions / Restrictions Precautions ?Precautions: Fall ?Restrictions ?Weight Bearing Restrictions: No  ? ?  ? ?Mobility ? Bed Mobility ?Overal bed mobility: Modified Independent ?  ?  ?  ?  ?  ?  ?General bed mobility comments: patient able to come to sitting edge of bed independently with bed rail, increased time needed due to dizziness ?  ? ?Transfers ?Overall transfer level: Needs assistance ?Equipment used: None ?Transfers: Sit to/from Stand ?Sit to Stand: Min assist ?  ?  ?  ?  ?  ?General transfer comment: able to stand holding to counter. Static standing is good even without UE support ?  ? ?Ambulation/Gait ?Ambulation/Gait assistance: Min assist ?Gait Distance (Feet): 25 Feet ?Assistive device: 1 person hand held assist ?Gait Pattern/deviations: Step-through pattern, Decreased step length - right, Decreased step length - left, Decreased stride length, Staggering left, Staggering right ?Gait velocity: decr ?  ?  ?General Gait Details: patient able to ambulate to bathroom and then to recliner requires at least single UE support and min A for unsteadiness with ambulation. ? ?Stairs ?  ?  ?  ?  ?  ? ?Wheelchair Mobility ?  ? ?Modified Rankin (Stroke Patients Only) ?  ? ?  ? ?Balance Overall balance assessment: Needs assistance ?Sitting-balance support: Feet supported ?Sitting balance-Leahy Scale: Good ?  ?  ?Standing balance support: Bilateral upper extremity supported, Single extremity supported, During functional activity, Reliant on assistive device for balance ?Standing balance-Leahy Scale: Fair ?Standing balance comment: requires assistance, at least single UE support ?  ?  ?  ?  ?  ?  ?  ?  ?  ?  ?  ?   ? ? ? ?  Pertinent Vitals/Pain Pain Assessment ?Pain Assessment: No/denies pain  ? ? ?Home Living Family/patient expects to be discharged to:: Private residence ?Living Arrangements: Spouse/significant  other ?Available Help at Discharge: Family;Friend(s);Available PRN/intermittently ?Type of Home: House ?Home Access: Stairs to enter ?Entrance Stairs-Rails: Right ?Entrance Stairs-Number of Steps: 4 ?  ?Home Layout: One level ?Home Equipment: None ?   ?  ?Prior Function Prior Level of Function : Independent/Modified Independent ?  ?  ?  ?  ?  ?  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  ? ? ?Hand Dominance  ?   ? ?  ?Extremity/Trunk Assessment  ? Upper Extremity Assessment ?Upper Extremity Assessment: Defer to OT evaluation ?  ? ?Lower Extremity Assessment ?Lower Extremity Assessment: Overall WFL for tasks assessed ?  ? ?Cervical / Trunk Assessment ?Cervical / Trunk Assessment: Normal  ?Communication  ? Communication: No difficulties  ?Cognition Arousal/Alertness: Awake/alert ?Behavior During Therapy: Flat affect ?Overall Cognitive Status: Within Functional Limits for tasks assessed ?  ?  ?  ?  ?  ?  ?  ?  ?  ?  ?  ?  ?  ?  ?  ?  ?  ?  ?  ? ?  ?General Comments   ? ?  ?Exercises    ? ?Assessment/Plan  ?  ?PT Assessment Patient needs continued PT services  ?PT Problem List Decreased mobility;Decreased balance;Decreased activity tolerance;Decreased knowledge of use of DME ? ?   ?  ?PT Treatment Interventions DME instruction;Gait training;Stair training;Therapeutic activities;Patient/family education;Balance training   ? ?PT Goals (Current goals can be found in the Care Plan section)  ?Acute Rehab PT Goals ?Patient Stated Goal: to feel better ?PT Goal Formulation: With patient ?Time For Goal Achievement: 05/03/21 ?Potential to Achieve Goals: Fair ? ?  ?Frequency Min 2X/week ?  ? ? ?Co-evaluation   ?  ?  ?  ?  ? ? ?  ?AM-PAC PT "6 Clicks" Mobility  ?Outcome Measure Help needed turning from your back to your side while in a flat bed without using bedrails?: None ?Help needed moving from lying on your  back to sitting on the side of a flat bed without using bedrails?: None ?Help needed moving to and from a bed to a chair (including a wheelchair)?: A Little ?Help needed standing up from a chair using your arms (e.g., wheelchair or bedside chair)?: A Little ?Help needed to walk in hospital room?: A Little ?Help needed climbing 3-5 steps with a railing? : A Lot ?6 Click Score: 19 ? ?  ?End of Session Equipment Utilized During Treatment: Gait belt ?Activity Tolerance: Other (comment) (limited by dizziness) ?Patient left: in chair;with chair alarm set;with call bell/phone within reach ?Nurse Communication: Mobility status ?PT Visit Diagnosis: Difficulty in walking, not elsewhere classified (R26.2);Unsteadiness on feet (R26.81);Ataxic gait (R26.0);Dizziness and giddiness (R42) ?  ? ?Time: 1607-3710 ?PT Time Calculation (min) (ACUTE ONLY): 21 min ? ? ?Charges:   PT Evaluation ?$PT Eval Moderate Complexity: 1 Mod ?PT Treatments ?$Gait Training: 8-22 mins ?  ?   ? ? ?Amanda Cockayne, PT, GCS ?04/26/21,11:32 AM ? ?

## 2021-04-26 NOTE — Progress Notes (Signed)
Occupational Therapy Treatment ?Patient Details ?Name: Christie Bryant ?MRN: [id number] ?DOB: 1955-10-10 ?Today's Date: 04/26/2021 ? ? ?History of present illness Christie Bryant, 66 y/o with h/o DM, HTN, hypothyroidism awoke this AM, stood suddenly and had severe vertigo. She describes the room as spinning and had associated nausea. She attempted to walk to the kitchen, had continued vertigo and fell, requiring assistance to stand. 04/25/21 MRI brain - acute infarct L cerebellum with edema but no sig mass effect ?  ?OT comments ? Chart reviewed to date, pt greeted in bed with daughter in law present. Pt is agreeable to OT tx session. Improved tolerance for mobility on this date with pt amb to bathroom with intermittent HHA, reaching outwardly for support with intermittent vcs provided for safe technique. Toilet transfer and toileting completed with CGA, grooming tasks at sink level with CGA. Dizziness reported throughout, no nausea reported. Vision assessment completed, please see below. Education provided re: location of infarcts and subsequent symptoms. Pt was independent in all ADL/IADL PTA, would continue to benefit from ongoing OT to address functional deficits and to facilitate return to PLOF. Pt is left as received, NAD, all needs met. OT will continue to follow.   ? ?Recommendations for follow up therapy are one component of a multi-disciplinary discharge planning process, led by the attending physician.  Recommendations may be updated based on patient status, additional functional criteria and insurance authorization. ?   ?Follow Up Recommendations ? Acute inpatient rehab (3hours/day)  ?  ?Assistance Recommended at Discharge Intermittent Supervision/Assistance  ?Patient can return home with the following ? A lot of help with walking and/or transfers;A little help with bathing/dressing/bathroom ?  ?Equipment Recommendations ? BSC/3in1  ?  ?Recommendations for Other Services   ? ?  ?Precautions / Restrictions  Precautions ?Precautions: Fall ?Restrictions ?Weight Bearing Restrictions: No  ? ? ?  ? ?Mobility Bed Mobility ?Overal bed mobility: Modified Independent, Needs Assistance ?Bed Mobility: Supine to Sit, Sit to Supine ?  ?  ?Supine to sit: HOB elevated, Modified independent (Device/Increase time) ?Sit to supine: HOB elevated, Modified independent (Device/Increase time) ?  ?  ?  ? ?Transfers ?Overall transfer level: Needs assistance ?Equipment used: None ?Transfers: Sit to/from Stand ?Sit to Stand: Min guard ?  ?  ?  ?  ?  ?  ?  ?  ?Balance Overall balance assessment: Needs assistance ?Sitting-balance support: Feet supported ?Sitting balance-Leahy Scale: Good ?  ?  ?Standing balance support: Single extremity supported, During functional activity ?Standing balance-Leahy Scale: Fair ?  ?  ?  ?  ?  ?  ?  ?  ?  ?  ?  ?  ?   ? ?ADL either performed or assessed with clinical judgement  ? ?ADL Overall ADL's : Needs assistance/impaired ?  ?  ?Grooming: Wash/dry hands;Standing;Cueing for safety;Min guard ?Grooming Details (indicate cue type and reason): sink level ?Upper Body Bathing: Minimal assistance;Sitting ?Upper Body Bathing Details (indicate cue type and reason): washing hair at edge of bed ?  ?  ?  ?  ?  ?  ?Toilet Transfer: Copy;Ambulation;Minimal assistance ?Toilet Transfer Details (indicate cue type and reason): reaching outward for support, intermittent HHA ?Toileting- Water quality scientist and Hygiene: Min guard;Sit to/from stand ?  ?  ?  ?Functional mobility during ADLs: Min guard;Cueing for safety;Minimal assistance (pt reaching for outward support while amb to bathroom due to reported dizziness, intermittent HHA) ?  ?  ? ?Extremity/Trunk Assessment   ?  ?  ?  ?  ?  ? ?  Vision   ?Vision Assessment?: Yes ?Ocular Range of Motion: Within Functional Limits ?Tracking/Visual Pursuits: Able to track stimulus in all quads without difficulty ?Convergence: Within functional limits ?Visual  Fields: No apparent deficits ?Additional Comments: tracking, scanning all appear WFL. No nystagmus noted. ?  ?Perception   ?  ?Praxis   ?  ? ?Cognition Arousal/Alertness: Awake/alert ?Behavior During Therapy: [facility] for tasks assessed/performed ?Overall Cognitive Status: Within Functional Limits for tasks assessed ?  ?  ?  ?  ?  ?  ?  ?  ?  ?  ?  ?  ?  ?  ?  ?  ?  ?  ?  ?   ?Exercises Other Exercises ?Other Exercises: edu pt and Christie Bryant re: stroke location and corresponding symptoms, home safety, DME/AE use for safe ADL completion, falls pervention, compensatory strategies for dizziness with mobility ? ?  ?Shoulder Instructions   ? ? ?  ?General Comments    ? ? ?Pertinent Vitals/ Pain       Pain Assessment ?Pain Assessment: No/denies pain ? ?Home Living   ?  ?  ?  ?  ?  ?  ?  ?  ?  ?  ?  ?  ?  ?  ?  ?  ?  ?  ? ?  ?Prior Functioning/Environment    ?  ?  ?  ?   ? ?Frequency ?    ? ? ? ? ?  ?Progress Toward Goals ? ?OT Goals(current goals can now be found in the care plan section) ? Progress towards OT goals: Progressing toward goals ? ?Acute Rehab OT Goals ?Patient Stated Goal: go home ?OT Goal Formulation: With patient ?Time For Goal Achievement: 05/10/21 ?Potential to Achieve Goals: Good  ?Plan Discharge plan remains appropriate   ? ?Co-evaluation ? ? ?   ?  ?  ?  ?  ? ?  ?AM-PAC OT "6 Clicks" Daily Activity     ?Outcome Measure ? ? Help from another person eating meals?: None ?Help from another person taking care of personal grooming?: None ?Help from another person toileting, which includes using toliet, bedpan, or urinal?: None ?Help from another person bathing (including washing, rinsing, drying)?: A Little ?Help from another person to put on and taking off regular upper body clothing?: None ?Help from another person to put on and taking off regular lower body clothing?: A Little ?6 Click Score: 22 ? ?  ?End of Session   ? ?OT Visit Diagnosis: Other abnormalities of gait and mobility (R26.89);Muscle weakness  (generalized) (M62.81) ?  ?Activity Tolerance Patient tolerated treatment well ?  ?Patient Left in bed;with call bell/phone within reach;with family/visitor present ?  ?Nurse Communication Mobility status ?  ? ?   ? ?Time: 1916-6060 ?OT Time Calculation (min): 23 min ? ?Charges: OT General Charges ?$OT Visit: 1 Visit ?OT Treatments ?$Self Care/Home Management : 23-37 mins ? ?Shanon Payor, OTD OTR/L  ?04/26/21, 4:39 PM  ?

## 2021-04-26 NOTE — Progress Notes (Signed)
Inpatient Rehab Admissions Coordinator:  ? ?Per therapy recommendations, patient was screened for CIR candidacy by Megan Salon, MS, CCC-SLP. At this time, Pt. Has not yet attempted OOB with therapies and is not tolerating/participating well enough that I believe she could tolerate the intensity of CIR.   Pt. may have potential to progress to becoming a potential CIR candidate, so CIR admissions team will follow and monitor for progress and participation with therapies and place consult order if Pt. appears to be an appropriate candidate. Please contact me with any questions.  ? ?Megan Salon, MS, CCC-SLP ?Rehab Admissions Coordinator  ?[phone] (celll) ?[phone] (office) ? ? ?

## 2021-04-26 NOTE — Progress Notes (Signed)
?  Progress Note ? ? ?Patient: Christie Bryant ZBF:[id number] DOB: 1955/07/30 DOA: 04/24/2021     1 ?DOS: the patient was seen and examined on 04/26/2021 ?  ?Brief hospital course: ?No notes on file ? ?Assessment and Plan: ?* Cerebellar stroke ([place]) ?left inferior cerebellum and left parietal occipital region ?- Atorvastatin $RemoveBeforeD'80mg'hKcbOuFRQaOgJO$  daily ?- ASA $Remo'81mg'KzIGO$  daily + plavix $Remove'75mg'LQyjlPf$  daily x21 days f/b ASA $Remov'81mg'RazBIe$  daily monotherapy after that ?--outpatient neurology f/u ?--PT/OT ?--cont tele to assess for Afib ? ?Vertigo ?--likely 2/2 cerebellar stroke ?Plan: ?--meclizine 25 mg TID  ?--zofran PRN ?--d/c scheduled IV reglan ? ?Hypertension ?--cont Lopressor and Maxzide ? ?Hypokalemia ?--monitor and replete PRN ? ?DM type 2 with diabetic peripheral neuropathy ([place]) ?[place] A1C 02/04/21 6.6 %. ?--BG has been within inpatient goal ?--d/c BG checks and SSI ?  ? ?Hypothyroidism ?--cont Synthroid ? ? ? ? ?  ? ?Subjective:  ?Pt reported much improvement in dizziness.  Was able to get out of bed and work with PT today.  Able to eat some today. ? ? ?Physical Exam: ? ?Constitutional: NAD, AAOx3, sitting in recliner ?HEENT: conjunctivae and lids normal, EOMI ?CV: No cyanosis.   ?RESP: normal respiratory effort, on RA ?SKIN: warm, dry ?Neuro: II - XII grossly intact.   ?Psych: Normal mood and affect.  Appropriate judgement and reason ? ? ?Data Reviewed: ? ?Family Communication:  ? ?Disposition: ?Status is: Inpatient ? ? Planned Discharge Destination:  CIR ? ? ? ?Time spent: 50 minutes ? ?Author: ?Enzo Bi, MD ?04/26/2021 7:15 PM ? ?For on call review [email or web address].  ?

## 2021-04-26 NOTE — Assessment & Plan Note (Addendum)
left inferior cerebellum and left parietal occipital region ?- Atorvastatin 80mg  daily ?- ASA 81mg  daily + plavix 75mg  daily x21 days f/b ASA 81mg  daily monotherapy after that ?--outpatient neurology f/u ?--PT/OT ?--cont tele to assess for Afib ?

## 2021-04-27 DIAGNOSIS — I639 Cerebral infarction, unspecified: Secondary | ICD-10-CM | POA: Diagnosis not present

## 2021-04-27 LAB — BASIC METABOLIC PANEL
Anion gap: 8 (ref 5–15)
BUN: 14 mg/dL (ref 8–23)
CO2: 24 mmol/L (ref 22–32)
Calcium: 9.4 mg/dL (ref 8.9–10.3)
Chloride: 102 mmol/L (ref 98–111)
Creatinine, Ser: 1 mg/dL (ref 0.44–1.00)
GFR, Estimated: >60 mL/min (ref >60)
Glucose, Bld: 201 mg/dL — ABNORMAL HIGH (ref 70–99)
Potassium: 3.5 mmol/L (ref 3.5–5.1)
Sodium: 134 mmol/L — ABNORMAL LOW (ref 135–145)

## 2021-04-27 LAB — CBC
HCT: 40.2 % (ref 36.0–46.0)
Hemoglobin: 14.1 g/dL (ref 12.0–15.0)
MCH: 32.5 pg (ref 26.0–34.0)
MCHC: 35.1 g/dL (ref 30.0–36.0)
MCV: 92.6 fL (ref 80.0–100.0)
Platelets: 187 10*3/uL (ref 150–400)
RBC: 4.34 MIL/uL (ref 3.87–5.11)
RDW: 13.5 % (ref 11.5–15.5)
WBC: 6.9 10*3/uL (ref 4.0–10.5)
nRBC: 0 % (ref 0.0–0.2)

## 2021-04-27 LAB — GLUCOSE, CAPILLARY: Glucose-Capillary: 177 mg/dL — ABNORMAL HIGH (ref 70–99)

## 2021-04-27 LAB — MAGNESIUM: Magnesium: 2 mg/dL (ref 1.7–2.4)

## 2021-04-27 NOTE — Progress Notes (Signed)
Physical Therapy Treatment Patient Details Name: Christie Bryant MRN: [id number] DOB: November 15, 1955 Today's Date: 04/27/2021   History of Present Illness Christie Bryant, 66 y/o with h/o DM, HTN, hypothyroidism awoke this AM, stood suddenly and had severe vertigo. She describes the room as spinning and had associated nausea. She attempted to walk to the kitchen, had continued vertigo and fell, requiring assistance to stand. 04/25/21 MRI brain - acute infarct L cerebellum with edema but no sig mass effect    PT Comments    Pt agrees with encouragement on second attempt.  She is able to get in and out of bed on her own.  Stands and walks 4' with RW and min guard/assist with balance deficits noted.  She remains limited by dizziness.  Verbal review of HEP once she self initiates return to bed.  Pt has refused CIR and states she wants to return home.  She is not interested in any overnight rehab options but agrees to HHPT.  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  Pt continues with deficits and would benefit from increased support at home if available to her.   Recommendations for follow up therapy are one component of a multi-disciplinary discharge planning process, led by the attending physician.  Recommendations may be updated based on patient status, additional functional criteria and insurance authorization.  Follow Up Recommendations  Home health PT     Assistance Recommended at Discharge Frequent or constant Supervision/Assistance  Patient can return home with the following A little help with walking and/or transfers;A little help with bathing/dressing/bathroom;Assist for transportation;Help with stairs or ramp for entrance;Assistance with cooking/housework   Equipment Recommendations  Rolling walker (2 wheels);BSC/3in1    Recommendations for Other Services Rehab consult     Precautions / Restrictions Precautions Precautions: Fall Restrictions Weight Bearing Restrictions: No      Mobility  Bed Mobility Overal bed mobility: Modified Independent, Needs Assistance Bed Mobility: Supine to Sit, Sit to Supine     Supine to sit: HOB elevated, Modified independent (Device/Increase time) Sit to supine: HOB elevated, Modified independent (Device/Increase time)        Transfers Overall transfer level: Needs assistance Equipment used: None Transfers: Sit to/from Stand Sit to Stand: Min guard                Ambulation/Gait Ambulation/Gait assistance: Min guard, Min assist Gait Distance (Feet): 50 Feet Assistive device: Rolling walker (2 wheels) Gait Pattern/deviations: Step-through pattern, Decreased step length - right, Decreased step length - left, Decreased stride length, Staggering left, Staggering right Gait velocity: decr     General Gait Details: trial of walker today with improved gait   Stairs             Wheelchair Mobility    Modified Rankin (Stroke Patients Only)       Balance Overall balance assessment: Needs assistance Sitting-balance support: Feet supported Sitting balance-Leahy Scale: Good     Standing balance support: Single extremity supported, During functional activity Standing balance-Leahy Scale: Fair Standing balance comment: improved with walker but continued to need +1 for safety                            Cognition Arousal/Alertness: Awake/alert Behavior During Therapy: WFL for tasks assessed/performed Overall Cognitive Status: Within Functional Limits for tasks assessed  Exercises      General Comments        Pertinent Vitals/Pain Pain Assessment Pain Assessment: No/denies pain    Home Living                          Prior Function            PT Goals (current goals can now be found in the care plan section) Progress towards PT goals: Progressing toward goals    Frequency    Min 2X/week      PT Plan       Co-evaluation              AM-PAC PT "6 Clicks" Mobility   Outcome Measure  Help needed turning from your back to your side while in a flat bed without using bedrails?: None Help needed moving from lying on your back to sitting on the side of a flat bed without using bedrails?: None Help needed moving to and from a bed to a chair (including a wheelchair)?: A Little Help needed standing up from a chair using your arms (e.g., wheelchair or bedside chair)?: None Help needed to walk in hospital room?: A Little Help needed climbing 3-5 steps with a railing? : A Little 6 Click Score: 21    End of Session Equipment Utilized During Treatment: Gait belt Activity Tolerance: Other (comment) (limited by dizziness) Patient left: in chair;with chair alarm set;with call bell/phone within reach Nurse Communication: Mobility status PT Visit Diagnosis: Difficulty in walking, not elsewhere classified (R26.2);Unsteadiness on feet (R26.81);Ataxic gait (R26.0);Dizziness and giddiness (R42)     Time: 9470-9628 PT Time Calculation (min) (ACUTE ONLY): 8 min  Charges:  $Gait Training: 8-22 mins                   Christie Bryant, PTA 04/27/21, 12:55 PM

## 2021-04-27 NOTE — Progress Notes (Signed)
Inpatient Rehab Admissions Coordinator:  ? ?Per acute MD, Pt. Declining CIR, wishing to go home. I will d/c CIR consult.  ? ?Megan Salon, MS, CCC-SLP ?Rehab Admissions Coordinator  ?[phone] (celll) ?[phone] (office) ? ?

## 2021-04-27 NOTE — Progress Notes (Signed)
Inpatient Rehab Admissions Coordinator:  ? ?Per therapy recommendations,  patient was screened for CIR candidacy by Sussan Meter, MS, CCC-SLP. At this time, Pt. Appears to be a a potential candidate for CIR. I will place   order for rehab consult per protocol for full assessment. Please contact me any with questions. ? ?Master Touchet, MS, CCC-SLP ?Rehab Admissions Coordinator  ?[phone] (celll) ?[phone] (office) ? ?

## 2021-04-27 NOTE — Progress Notes (Signed)
?  Progress Note ? ? ?Patient: Christie Bryant ZSW:[id number] DOB: [birth year] DOA: 04/24/2021     2 ?DOS: the patient was seen and examined on 04/27/2021 ?  ?Brief hospital course: ?No notes on file ? ?Assessment and Plan: ?* Cerebellar stroke ([place]) ?left inferior cerebellum and left parietal occipital region ?- Atorvastatin 16m daily ?- ASA 85mdaily + plavix 7530maily x21 days f/b ASA 76m82mily monotherapy after that ?--outpatient neurology f/u ?--PT/OT ?--cont tele to assess for Afib ? ?Vertigo ?--likely 2/2 cerebellar stroke ?Plan: ?--meclizine 25 mg TID  ?--zofran PRN ? ?Hypertension ?--cont Lopressor and Maxzide ? ?Hypokalemia ?--monitor and replete PRN ? ?DM type 2 with diabetic peripheral neuropathy ([facility])[place] A1C 02/04/21 6.6 %. ?--BG has been within inpatient goal ?--d/c'ed BG checks and SSI ?  ? ?Hypothyroidism ?--cont Synthroid ? ? ? ? ?  ? ?Subjective:  ?Pt still had dizziness, but improved, and did not vomit.  Pt still declined CIR and wanted to go home with HH. [facility]? ?Physical Exam: ? ?Constitutional: NAD, AAOx3, sitting in recliner ?HEENT: conjunctivae and lids normal, EOMI ?CV: No cyanosis.   ?RESP: normal respiratory effort, on RA ?Extremities: No effusions, edema in BLE ?SKIN: warm, dry ?Neuro: II - XII grossly intact.   ?Psych: Normal mood and affect.  Appropriate judgement and reason ? ? ?Data Reviewed: ? ?Family Communication: daughter-in-law updated at bedside today ? ?Disposition: ?Status is: Inpatient ? ? Planned Discharge Destination: Home with Home Health ? ? ? ?Time spent: 35 minutes ? ?Author: ?TinaEnzo Bi ?04/27/2021 3:16 PM ? ?For on call review [email or web address]

## 2021-04-27 NOTE — TOC Initial Note (Signed)
Transition of Care (TOC) - Initial/Assessment Note  ? ? ?Patient Details  ?Name: Christie Bryant ?MRN: [id number] ?Date of Birth: 29-Nov-1955 ? ?Transition of Care (TOC) CM/SW Contact:    ?Harriet Masson, RN ?Phone Number: [phone] ?04/27/2021, 10:53 AM ? ?Clinical Narrative:                 ?Recommended PT/OT with [facility]. RN spoke with the daughter in law Joseph Art) and the pt today. Pt receptive to [facility] services with [facility]-spoke with Meg who accepted HHPT/OT to start on Tuesday for this patient. Pt lives with her spouse and has support system for transportation to all medical appointments and upon her discharge from the hospital. Pt can afford all her medications with no acute needs. Pt already has RW and decline 3-1commode recommended. Only steps to get into her home with other needs presented at this time. ? ?TOC will continue to be available for any other d/c needs. ? ?Expected Discharge Plan: [place] ?Barriers to Discharge: Barriers Resolved ? ? ?Patient Goals and CMS Choice ?  ?  ?Choice offered to / list presented to : Patient ? ?Expected Discharge Plan and Services ?Expected Discharge Plan: [place] ?  ?Discharge Planning Services: CM Consult ?Post Acute Care Choice: Home Health ?Living arrangements for the past 2 months: [place] ?                ?  ?  ?  ?  ?  ?HH Arranged: PT, OT ?[place] Agency: [place] ?Date HH Agency Contacted: 04/27/21 ?Time [place]: [id number] spoke with at [place]: [place] ? ?Prior Living Arrangements/Services ?Living arrangements for the past 2 months: [place] ?Lives with:: Spouse ?Patient language and need for interpreter reviewed:: Yes ?Do you feel safe going back to the place where you live?: Yes      ?Need for Family Participation in Patient Care: Yes (Comment) ?Care giver support system in place?: Yes (comment) ?  ?Criminal Activity/Legal Involvement Pertinent to Current  Situation/Hospitalization: No - Comment as needed ? ?Activities of Daily Living ?  ?  ? ?Permission Sought/Granted ?  ?Permission granted to share information with : Yes, Verbal Permission Granted ?   ?   ?   ?   ? ?Emotional Assessment ?Appearance:: Appears stated age ?Attitude/Demeanor/Rapport: Engaged ?Affect (typically observed): Accepting ?Orientation: : Oriented to Self, Oriented to Place, Oriented to  Time, Oriented to Situation ?Alcohol / Substance Use: Not Applicable ?Psych Involvement: No (comment) ? ?Admission diagnosis:  Vertigo [R42] ?CVA (cerebral vascular accident) [facility]) [I63.9] ?Patient Active Problem List  ? Diagnosis Date Noted  ? Cerebellar stroke ([place]) 04/25/2021  ? Vertigo 04/24/2021  ? Hypokalemia 04/24/2021  ? Gout 08/21/2020  ? Edema 09/05/2019  ? Varicose veins with inflammation 09/05/2019  ? DM type 2 with diabetic peripheral neuropathy ([place]) 08/03/2018  ? Hypertension 09/08/2011  ? Hypothyroidism 09/08/2011  ? Hyperlipidemia with target low density lipoprotein (LDL) cholesterol less than 70 mg/dL 09/08/2011  ? Neuropathy in diabetes ([place]) 09/08/2011  ? ?PCP:  Valerie Roys, DO ?Pharmacy:   ?[facility] [address] ?[place] [address] ?Phone: [phone] Fax: [phone] ? ? ? ? ?Social Determinants of Health (SDOH) Interventions ?  ? ?Readmission Risk Interventions ?   ? View : No data to display.  ?  ?  ?  ? ? ? ?

## 2021-04-28 LAB — BASIC METABOLIC PANEL
Anion gap: 10 (ref 5–15)
BUN: 17 mg/dL (ref 8–23)
CO2: 23 mmol/L (ref 22–32)
Calcium: 9.8 mg/dL (ref 8.9–10.3)
Chloride: 100 mmol/L (ref 98–111)
Creatinine, Ser: 1 mg/dL (ref 0.44–1.00)
GFR, Estimated: >60 mL/min (ref >60)
Glucose, Bld: 189 mg/dL — ABNORMAL HIGH (ref 70–99)
Potassium: 3.2 mmol/L — ABNORMAL LOW (ref 3.5–5.1)
Sodium: 133 mmol/L — ABNORMAL LOW (ref 135–145)

## 2021-04-28 LAB — CBC
HCT: 43.7 % (ref 36.0–46.0)
Hemoglobin: 15.4 g/dL — ABNORMAL HIGH (ref 12.0–15.0)
MCH: 33 pg (ref 26.0–34.0)
MCHC: 35.2 g/dL (ref 30.0–36.0)
MCV: 93.6 fL (ref 80.0–100.0)
Platelets: 195 10*3/uL (ref 150–400)
RBC: 4.67 MIL/uL (ref 3.87–5.11)
RDW: 13.6 % (ref 11.5–15.5)
WBC: 7.6 10*3/uL (ref 4.0–10.5)
nRBC: 0 % (ref 0.0–0.2)

## 2021-04-28 LAB — MAGNESIUM: Magnesium: 2.2 mg/dL (ref 1.7–2.4)

## 2021-04-28 MED ORDER — MECLIZINE HCL 25 MG PO TABS: 25.0000 mg | ORAL_TABLET | Freq: Three times a day (TID) | ORAL | 2 refills | Status: DC | PRN

## 2021-04-28 MED ORDER — ATORVASTATIN CALCIUM 80 MG PO TABS
80.0000 mg | ORAL_TABLET | Freq: Every day | ORAL | 0 refills | Status: DC
Start: 2021-04-28 — End: 2021-08-05

## 2021-04-28 MED ORDER — ASPIRIN 81 MG PO CHEW: 81.0000 mg | CHEWABLE_TABLET | Freq: Every day | ORAL | Status: DC

## 2021-04-28 MED ORDER — CLOPIDOGREL BISULFATE 75 MG PO TABS: 75.0000 mg | ORAL_TABLET | Freq: Every day | ORAL | 0 refills | Status: DC

## 2021-04-28 MED ORDER — POTASSIUM CHLORIDE CRYS ER 20 MEQ PO TBCR
40.0000 meq | EXTENDED_RELEASE_TABLET | Freq: Once | ORAL | Status: AC
  Administered 2021-04-28: 40 meq via ORAL
  Filled 2021-04-28: qty 2

## 2021-04-28 NOTE — Discharge Summary (Signed)
? ?Physician Discharge Summary ? ? ?Christie Bryant  female DOB: October 08, 1955  ?[id number] ? ?PCP: Valerie Roys, DO ? ?Admit date: 04/24/2021 ?Discharge date: 04/28/2021 ? ?Admitted From: hom ?Disposition:  home.  Pt declined SNF rehab. ?Home Health: Yes ?CODE STATUS: Full code ? ?Discharge Instructions   ? ? Ambulatory referral to Neurology   Complete by: As directed ?  ? Discharge instructions   Complete by: As directed ?  ? You have had a stroke that caused your severe dizziness.  Please take aspirin 81 mg and plavix together for 21 days, and after that, just aspirin.  Your cholesterol medication is changed to stronger Lipitor. ? ? ?Dr. Enzo Bi ?- ?-  ? ?  ? ?Hospital Course:  ?For full details, please see H&P, progress notes, consult notes and ancillary notes.  ?Briefly,  ?Ms Christie Bryant, 66 y/o with h/o DM, HTN, hypothyroidism who awoke morning of presentation, stood suddenly and had severe vertigo. She described the room as spinning and had associated nausea. She attempted to walk to the kitchen, had continued vertigo and fell, requiring assistance to stand.  ? ?* Cerebellar stroke ([place]) ?left inferior cerebellum and left parietal occipital region ?- changed home pravastatin to Atorvastatin $RemoveBeforeD'80mg'dhpWADKWrHiLdW$  daily ?- ASA $Remo'81mg'pBTyL$  daily + plavix $Remove'75mg'ZgbWTbb$  daily x21 days f/b ASA $Remov'81mg'LJhMpd$  daily monotherapy after that ?--outpatient neurology f/u ?--cont tele during hospitalization did not note Afib. ?--Pt declined SNF rehab and wanted to go home. ?  ?Vertigo ?--likely 2/2 cerebellar stroke.  Dizziness improved prior to discharge. ?--meclizine 25 mg TID during hospitalization and as PRN after discharge. ?  ?Hypertension ?--cont Lopressor and Maxzide ?  ?Hypokalemia ?--monitored and repleted PRN ?  ?DM type 2 with diabetic peripheral neuropathy ([place]) ?Lst A1C 02/04/21 6.6 %. ?--BG has been within inpatient goal ?--discharged back on home regimen as below. ?             ?Hypothyroidism ?--cont Synthroid ? ? ?Discharge Diagnoses:  ?Principal  Problem: ?  Cerebellar stroke ([place]) ?Active Problems: ?  Vertigo ?  Hypertension ?  Hypothyroidism ?  DM type 2 with diabetic peripheral neuropathy ([place]) ?  Hypokalemia ? ? ?30 Day Unplanned Readmission Risk Score   ? ?Flowsheet Row ED to Hosp-Admission (Current) from 04/24/2021 in [place]  ?30 Day Unplanned Readmission Risk Score (%) 10.85 Filed at 04/28/2021 0401  ? ?  ? ? This score is the patient's risk of an unplanned readmission within 30 days of being discharged (0 -100%). The score is based on dignosis, age, lab data, medications, orders, and past utilization.   ?Low:  0-14.9   Medium: 15-21.9   High: 22-29.9   Extreme: 30 and above ? ?  ? ?  ? ? ?Discharge Instructions: ? ?Allergies as of 04/28/2021   ?No Known Allergies ?  ? ?  ?Medication List  ?  ? ?STOP taking these medications   ? ?pravastatin 40 MG tablet ?Commonly known as: PRAVACHOL ?  ? ?  ? ?TAKE these medications   ? ?acetaminophen 325 MG tablet ?Commonly known as: TYLENOL ?Take 2 tablets (650 mg total) by mouth every 6 (six) hours as needed for mild pain (or Fever >/= 101). ?  ?aspirin 81 MG chewable tablet ?Chew 1 tablet (81 mg total) by mouth daily. ?  ?atorvastatin 80 MG tablet ?Commonly known as: LIPITOR ?Take 1 tablet (80 mg total) by mouth at bedtime. ?  ?clopidogrel 75 MG tablet ?Commonly known as: PLAVIX ?Take  1 tablet (75 mg total) by mouth daily for 21 days. ?Start taking on: April 29, 2021 ?  ?docusate sodium 100 MG capsule ?Commonly known as: COLACE ?Take 100 mg by mouth daily. ?  ?levothyroxine 125 MCG tablet ?Commonly known as: SYNTHROID ?TAKE 1 TABLET BY MOUTH ONCE DAILY ON  AN  EMPTY  STOMACH  WITH  A  GLASS  OF  WATER  AT  LEAST  30  TO  60  MINUTES  BEFORE  BREAKFAST ?  ?meclizine 25 MG tablet ?Commonly known as: ANTIVERT ?Take 1 tablet (25 mg total) by mouth 3 (three) times daily as needed for dizziness. ?  ?metFORMIN 1000 MG tablet ?Commonly known as: GLUCOPHAGE ?Take 1,000 mg by  mouth 2 (two) times daily with a meal. ?  ?metoprolol tartrate 100 MG tablet ?Commonly known as: LOPRESSOR ?Take 1 tablet by mouth 2 (two) times daily. ?  ?nortriptyline 10 MG capsule ?Commonly known as: PAMELOR ?Take 1 capsule (10 mg total) by mouth at bedtime. ?  ?omeprazole 20 MG capsule ?Commonly known as: PRILOSEC ?Take 20 mg by mouth daily. ?  ?Pen Needles 3/16" 31G X 5 MM Misc ?1 each by Does not apply route once a week. ?  ?Tyler Aas FlexTouch 200 UNIT/ML FlexTouch Pen ?Generic drug: insulin degludec ?Inject 0-40 Units into the skin at bedtime. ?  ?triamterene-hydrochlorothiazide 75-50 MG tablet ?Commonly known as: MAXZIDE ?Take 1/2 (one-half) tablet by mouth once daily ?  ?Trulicity 1.5 NI/6.2VO Sopn ?Generic drug: Dulaglutide ?Inject 1.5 mg into the skin once a week. ?  ?VITAMIN D-3 PO ?Take 2,000 Units by mouth daily. ?  ? ?  ? ?  ?  ? ? ?  ?Durable Medical Equipment  ?(From admission, onward)  ?  ? ? ?  ? ?  Start     Ordered  ? 04/28/21 0742  For home use only DME 3 n 1  Once       ? 04/28/21 0741  ? 04/28/21 0741  For home use only DME Walker rolling  Once       ?Question Answer Comment  ?Walker: With 5 Inch Wheels   ?Patient needs a walker to treat with the following condition Vertigo due to acute cerebrovascular disease   ?  ? 04/28/21 0741  ? ?  ?  ? ?  ? ? ? Follow-up Information   ? ? Johnson, Megan P, DO Follow up in 1 week(s).   ?Specialty: Family Medicine ?Contact information: ?[address] ?[place] [address] ?[phone] ? ? ?  ?  ? ?  ?  ? ?  ? ? ?No Known Allergies ? ? ?The results of significant diagnostics from this hospitalization (including imaging, microbiology, ancillary and laboratory) are listed below for reference.  ? ?Consultations: ? ? ?Procedures/Studies: ?CT HEAD WO CONTRAST (5MM) ? ?Result Date: 04/24/2021 ?CLINICAL DATA:  Provided history: New vertigo. Additional history provided: Code stroke. Dizziness. Lying on floor. Vomiting. EXAM: CT HEAD WITHOUT CONTRAST TECHNIQUE:  Contiguous axial images were obtained from the base of the skull through the vertex without intravenous contrast. RADIATION DOSE REDUCTION: This exam was performed according to the departmental dose-optimization program which includes automated exposure control, adjustment of the mA and/or kV according to patient size and/or use of iterative reconstruction technique. COMPARISON:  No pertinent prior exams available for comparison. FINDINGS: Brain: Mild generalized cerebral atrophy. Mild patchy and ill-defined hypoattenuation within the cerebral white matter, nonspecific but compatible with chronic small vessel in disease. Partially empty sella  turcica. There is no acute intracranial hemorrhage. No demarcated cortical infarct. No extra-axial fluid collection. No evidence of an intracranial mass. No midline shift. Vascular: No hyperdense vessel.  Atherosclerotic calcifications. Skull: Normal. Negative for fracture or focal lesion. Sinuses/Orbits: Visualized orbits show no acute finding. Mild mucosal thickening within the bilateral maxillary sinuses. Other: Ovoid lesions with irregular internal calcification within the bilateral parietooccipital scalp, likely reflecting sebaceous cysts. ASPECTS ([place] Stroke Program Early CT Score) - Ganglionic level infarction (caudate, lentiform nuclei, internal capsule, insula, M1-M3 cortex): 7 - Supraganglionic infarction (M4-M6 cortex): 3 Total score (0-10 with 10 being normal): 10 These results were communicated to Dr. Quinn Axe At 1:07 pmon 4/13/2023by text page via the [facility] messaging system. IMPRESSION: No evidence of acute intracranial abnormality. Mild chronic small vessel ischemic changes within the cerebral white matter. Mild generalized cerebral atrophy. Electronically Signed   By: Kellie Simmering D.O.   On: 04/24/2021 13:07  ? ?MR BRAIN WO CONTRAST ? ?Result Date: 04/24/2021 ?CLINICAL DATA:  Neuro deficit, acute, stroke suspected vertigo EXAM: MRI HEAD WITHOUT CONTRAST  TECHNIQUE: Multiplanar, multiecho pulse sequences of the brain and surrounding structures were obtained without intravenous contrast. COMPARISON:  Same day CTA and CT head. FINDINGS: Brain: Acute infarct in the inferior left ce

## 2021-04-28 NOTE — TOC Transition Note (Signed)
Transition of Care (TOC) - CM/SW Discharge Note ? ? ?Patient Details  ?Name: Christie Bryant ?MRN: [id number] ?Date of Birth: 1955-11-26 ? ?Transition of Care (TOC) CM/SW Contact:  ?Edin Skarda A Moneka Mcquinn, LCSW ?Phone Number: ?04/28/2021, 9:28 AM ? ? ?Clinical Narrative:   Christie Bryant will service at d/c. Meg with [facility] notified. ? ? ? ?Final next level of care: Home w Home Health Services ?Barriers to Discharge: No Barriers Identified ? ? ?Patient Goals and CMS Choice ?  ?  ?Choice offered to / list presented to : Patient ? ?Discharge Placement ?  ?           ?  ?  ?  ?  ? ?Discharge Plan and Services ?  ?Discharge Planning Services: CM Consult ?Post Acute Care Choice: Home Health          ?  ?  ?  ?  ?  ?HH Arranged: PT, OT ?HH Agency: [facility] ?Date HH Agency Contacted: 04/27/21 ?Time HH Agency Contacted: 1052 ?Representative spoke with at [facility] Agency: Meg ? ?Social Determinants of Health (SDOH) Interventions ?  ? ? ?Readmission Risk Interventions ?   ? View : No data to display.  ?  ?  ?  ? ? ? ? ? ?

## 2021-04-28 NOTE — Progress Notes (Signed)
PT Cancellation Note ? ?Patient Details ?Name: Christie Bryant ?MRN: [id number] ?DOB: 07/28/1955 ? ? ?Cancelled Treatment:    Reason Eval/Treat Not Completed: Other (comment) ? ?Discharge orders in.  Offered session but she declined session. Stated she was up earlier in bathroom.  Stated she feels better overall but still endorses weakness and unsteadiness.  Encouraged to use RW and +1 assist at home. ? ? ?Danielle Dess ?04/28/2021, 8:55 AM ?

## 2021-04-30 ENCOUNTER — Telehealth: Payer: Self-pay | Admitting: *Deleted

## 2021-04-30 DIAGNOSIS — I69398 Other sequelae of cerebral infarction: Secondary | ICD-10-CM | POA: Diagnosis not present

## 2021-04-30 DIAGNOSIS — N189 Chronic kidney disease, unspecified: Secondary | ICD-10-CM | POA: Diagnosis not present

## 2021-04-30 DIAGNOSIS — E1122 Type 2 diabetes mellitus with diabetic chronic kidney disease: Secondary | ICD-10-CM | POA: Diagnosis not present

## 2021-04-30 DIAGNOSIS — E876 Hypokalemia: Secondary | ICD-10-CM | POA: Diagnosis not present

## 2021-04-30 DIAGNOSIS — Z794 Long term (current) use of insulin: Secondary | ICD-10-CM | POA: Diagnosis not present

## 2021-04-30 DIAGNOSIS — I10 Essential (primary) hypertension: Secondary | ICD-10-CM | POA: Diagnosis not present

## 2021-04-30 DIAGNOSIS — E1140 Type 2 diabetes mellitus with diabetic neuropathy, unspecified: Secondary | ICD-10-CM | POA: Diagnosis not present

## 2021-04-30 DIAGNOSIS — R42 Dizziness and giddiness: Secondary | ICD-10-CM | POA: Diagnosis not present

## 2021-04-30 DIAGNOSIS — G709 Myoneural disorder, unspecified: Secondary | ICD-10-CM | POA: Diagnosis not present

## 2021-04-30 DIAGNOSIS — Z7985 Long-term (current) use of injectable non-insulin antidiabetic drugs: Secondary | ICD-10-CM | POA: Diagnosis not present

## 2021-04-30 NOTE — Telephone Encounter (Signed)
Transition Care Management Follow-up Telephone Call ?Date of discharge and from where: [facility] 04-28-2021 ?How have you been since you were released from the hospital? Feeling ok still dizzy ?Any questions or concerns? No ? ?Items Reviewed: ?Did the pt receive and understand the discharge instructions provided? Yes  ?Medications obtained and verified? Yes  ?Other? No  ?Any new allergies since your discharge? No  ?Dietary orders reviewed? No ?Do you have support at home? Yes  ? ?Home Care and Equipment/Supplies: ?Were home health services ordered?  ?If so, what is the name of the agency?   ?Has the agency set up a time to come to the patient's home?  ?Were any new equipment or medical supplies ordered?   ?What is the name of the medical supply agency?  ?Were you able to get the supplies/equipment?  ?Do you have any questions related to the use of the equipment or supplies?  ? ?Functional Questionnaire: (I = Independent and D = Dependent) ?ADLs: I ? ?Bathing/Dressing- I ? ?Meal Prep- I ? ?Eating- I ? ?Maintaining continence- I ? ?Transferring/Ambulation- I ? ?Managing Meds- I ? ?Follow up appointments reviewed: ? ?PCP Hospital f/u appt confirmed? Yes  Scheduled to see 05-05-2021 . ?Specialist Hospital f/u appt confirmed? No  Scheduled to see  on  Are transportation arrangements needed? No  ?If their condition worsens, is the pt aware to call PCP or go to the Emergency Dept.? Yes ?Was the patient provided with contact information for the PCP's office or ED? Yes ?Was to pt encouraged to call back with questions or concerns? Yes ?  ?

## 2021-05-01 ENCOUNTER — Telehealth: Payer: Self-pay | Admitting: Family Medicine

## 2021-05-01 NOTE — Telephone Encounter (Signed)
Called and gave verbal orders per Dr. Johnson.  °

## 2021-05-01 NOTE — Telephone Encounter (Signed)
Copied from CRM [id number]. Topic: Quick Communication - Home Health Verbal Orders ?>> Apr 30, 2021  4:55 PM Pawlus, Maxine Glenn A wrote: ?Caller/Agency: [facility]  ?Callback Number: [phone] ?Requesting: PT ?Frequency: 2x2, 1x2 ? ?Caller also wanted it noted that the pt was started on clopidogrel (PLAVIX) while in the ED. ?

## 2021-05-01 NOTE — Telephone Encounter (Signed)
OK for verbal orders. Please get scheduled for hospital follow up ?

## 2021-05-05 ENCOUNTER — Ambulatory Visit (INDEPENDENT_AMBULATORY_CARE_PROVIDER_SITE_OTHER): Payer: HMO | Admitting: Family Medicine

## 2021-05-05 ENCOUNTER — Telehealth: Payer: HMO

## 2021-05-05 ENCOUNTER — Encounter: Payer: Self-pay | Admitting: Family Medicine

## 2021-05-05 VITALS — BP 102/69 | HR 73 | Temp 98.1°F | Wt 213.6 lb

## 2021-05-05 DIAGNOSIS — E876 Hypokalemia: Secondary | ICD-10-CM

## 2021-05-05 DIAGNOSIS — Z8673 Personal history of transient ischemic attack (TIA), and cerebral infarction without residual deficits: Secondary | ICD-10-CM | POA: Diagnosis not present

## 2021-05-05 DIAGNOSIS — E1142 Type 2 diabetes mellitus with diabetic polyneuropathy: Secondary | ICD-10-CM | POA: Diagnosis not present

## 2021-05-05 MED ORDER — METFORMIN HCL 1000 MG PO TABS: 1000.0000 mg | ORAL_TABLET | Freq: Two times a day (BID) | ORAL | 1 refills | Status: DC

## 2021-05-05 MED ORDER — TRIAMTERENE-HCTZ 75-50 MG PO TABS: ORAL_TABLET | ORAL | 1 refills | Status: DC

## 2021-05-05 MED ORDER — MECLIZINE HCL 25 MG PO TABS: 25.0000 mg | ORAL_TABLET | Freq: Three times a day (TID) | ORAL | 6 refills | Status: DC | PRN

## 2021-05-05 MED ORDER — METOPROLOL TARTRATE 100 MG PO TABS: 100.0000 mg | ORAL_TABLET | Freq: Two times a day (BID) | ORAL | 1 refills | Status: DC

## 2021-05-05 NOTE — Assessment & Plan Note (Signed)
Rechecking labs today. Continue to monitor. Call with any concerns.  °

## 2021-05-05 NOTE — Assessment & Plan Note (Addendum)
Doing well with A1c of 5.9 in the hospital down from 6.6. Continue diet and exercise. Continue current regimen. Goal is still to increase trulicity and get her off insulin- but will hold for now due to cost and recent stroke. Will get CCM involved for help with cost- we will see about getting 3mg  trulicity approved rather than 1.5mg . Call with any concerns. Continue to monitor.  ?

## 2021-05-05 NOTE — Assessment & Plan Note (Signed)
Continues with vertigo. Will continue PT and meclizine. Recheck 1 month. Call with any concerns. Has appointment in July with neurology, but would like to be seen sooner. Referral for [place] made today. ?

## 2021-05-05 NOTE — Progress Notes (Signed)
? ?BP 102/69   Pulse 73   Temp 98.1 ?F (36.7 ?C)   Wt 213 lb 9.6 oz (96.9 kg)   SpO2 98%   BMI 32.48 kg/m?   ? ?Subjective:  ? ? Patient ID: Christie Bryant, female    DOB: 03/14/1955, 66 y.o.   MRN: [id number] ? ?HPI: ?Christie Bryant is a 66 y.o. female ? ?Chief Complaint  ?Patient presents with  ? Diabetes  ?  Patient states she has about 2 shots left of trulicity left. Patient states she is in the donut hole with her insurance and can't afford the refills. Is requesting patient assistance.   ? cerebellar stroke  ?  Patient was admitted to [facility] for 5 days due to having a stroke on 04/24/21. Patient states she feels woozy today.   ? Hospitalization Follow-up  ? ?Transition of Care Hospital Follow up.  ? ?Hospital/Facility: [facility] ?D/C Physician: Dr. Billie Ruddy ?D/C Date: 04/28/21 ? ?Records Requested: 05/05/21 ?Records Received: 05/05/21 ?Records Reviewed: 05/05/21 ? ?Diagnoses on Discharge:   Cerebellar stroke ([place]) ?  Vertigo ?  Hypertension ?  Hypothyroidism ?  DM type 2 with diabetic peripheral neuropathy ([place]) ?  Hypokalemia ? ?Date of interactive Contact within 48 hours of discharge: 04/28/21 and 04/30/21 ?Contact was through: direct and phone ? ?Date of 7 day or 14 day face-to-face visit:  05/05/21  within 7 days ? ?Outpatient Encounter Medications as of 05/05/2021  ?Medication Sig Note  ? aspirin 81 MG chewable tablet Chew 1 tablet (81 mg total) by mouth daily.   ? atorvastatin (LIPITOR) 80 MG tablet Take 1 tablet (80 mg total) by mouth at bedtime.   ? Cholecalciferol (VITAMIN D-3 PO) Take 2,000 Units by mouth daily.    ? clopidogrel (PLAVIX) 75 MG tablet Take 1 tablet (75 mg total) by mouth daily for 21 days.   ? docusate sodium (COLACE) 100 MG capsule Take 100 mg by mouth daily.    ? Dulaglutide (TRULICITY) 1.5 KH/9.9HF SOPN Inject 1.5 mg into the skin once a week.   ? Insulin Pen Needle (PEN NEEDLES 3/16") 31G X 5 MM MISC 1 each by Does not apply route once a week.   ? levothyroxine (SYNTHROID) 125 MCG tablet TAKE 1 TABLET  BY MOUTH ONCE DAILY ON  AN  EMPTY  STOMACH  WITH  A  GLASS  OF  WATER  AT  LEAST  30  TO  60  MINUTES  BEFORE  BREAKFAST   ? nortriptyline (PAMELOR) 10 MG capsule Take 1 capsule (10 mg total) by mouth at bedtime.   ? omeprazole (PRILOSEC) 20 MG capsule Take 20 mg by mouth daily.   ? TRESIBA FLEXTOUCH 200 UNIT/ML FlexTouch Pen Inject 0-40 Units into the skin at bedtime.   ? [DISCONTINUED] meclizine (ANTIVERT) 25 MG tablet Take 1 tablet (25 mg total) by mouth 3 (three) times daily as needed for dizziness.   ? [DISCONTINUED] metFORMIN (GLUCOPHAGE) 1000 MG tablet Take 1,000 mg by mouth 2 (two) times daily with a meal.   ? [DISCONTINUED] metoprolol tartrate (LOPRESSOR) 100 MG tablet Take 1 tablet by mouth 2 (two) times daily. 05/05/2021: One tablet in the AM, One tablet at night   ? [DISCONTINUED] triamterene-hydrochlorothiazide (MAXZIDE) 75-50 MG tablet Take 1/2 (one-half) tablet by mouth once daily   ? acetaminophen (TYLENOL) 325 MG tablet Take 2 tablets (650 mg total) by mouth every 6 (six) hours as needed for mild pain (or Fever >/= 101).   ? meclizine (ANTIVERT) 25  MG tablet Take 1 tablet (25 mg total) by mouth 3 (three) times daily as needed for dizziness.   ? metFORMIN (GLUCOPHAGE) 1000 MG tablet Take 1 tablet (1,000 mg total) by mouth 2 (two) times daily with a meal.   ? metoprolol tartrate (LOPRESSOR) 100 MG tablet Take 1 tablet (100 mg total) by mouth 2 (two) times daily.   ? triamterene-hydrochlorothiazide (MAXZIDE) 75-50 MG tablet Take 1/2 (one-half) tablet by mouth once daily   ? ?No facility-administered encounter medications on file as of 05/05/2021.  ?Per Hospitalist: "Hospital Course:  ?For full details, please see H&P, progress notes, consult notes and ancillary notes.  ?Briefly,  ?Christie Bryant, 66 y/o with h/o DM, HTN, hypothyroidism who awoke morning of presentation, stood suddenly and had severe vertigo. She described the room as spinning and had associated nausea. She attempted to walk to the kitchen, had  continued vertigo and fell, requiring assistance to stand.  ?  ?* Cerebellar stroke ([place]) ?left inferior cerebellum and left parietal occipital region ?- changed home pravastatin to Atorvastatin $RemoveBeforeD'80mg'mLJOeqFnkCoIcA$  daily ?- ASA $Remo'81mg'IPfEO$  daily + plavix $Remove'75mg'zWgIixD$  daily x21 days f/b ASA $Remov'81mg'zXSnoQ$  daily monotherapy after that ?--outpatient neurology f/u ?--cont tele during hospitalization did not note Afib. ?--Pt declined SNF rehab and wanted to go home. ?  ?Vertigo ?--likely 2/2 cerebellar stroke.  Dizziness improved prior to discharge. ?--meclizine 25 mg TID during hospitalization and as PRN after discharge. ?  ?Hypertension ?--cont Lopressor and Maxzide ?  ?Hypokalemia ?--monitored and repleted PRN ?  ?DM type 2 with diabetic peripheral neuropathy ([place]) ?Lst A1C 02/04/21 6.6 %. ?--BG has been within inpatient goal ?--discharged back on home regimen as below. ?             ?Hypothyroidism ?--cont Synthroid" ? ?Diagnostic Tests Reviewed: CLINICAL DATA:  Provided history: New vertigo. Additional history ?provided: Code stroke. Dizziness. Lying on floor. Vomiting. ?  ?EXAM: ?CT HEAD WITHOUT CONTRAST ?  ?TECHNIQUE: ?Contiguous axial images were obtained from the base of the skull ?through the vertex without intravenous contrast. ?  ?RADIATION DOSE REDUCTION: This exam was performed according to the ?departmental dose-optimization program which includes automated ?exposure control, adjustment of the mA and/or kV according to ?patient size and/or use of iterative reconstruction technique. ?  ?COMPARISON:  No pertinent prior exams available for comparison. ?  ?FINDINGS: ?Brain: ?  ?Mild generalized cerebral atrophy. ?  ?Mild patchy and ill-defined hypoattenuation within the cerebral ?white matter, nonspecific but compatible with chronic small vessel ?in disease. ?  ?Partially empty sella turcica. ?  ?There is no acute intracranial hemorrhage. ?  ?No demarcated cortical infarct. ?  ?No extra-axial fluid collection. ?  ?No evidence of an intracranial  mass. ?  ?No midline shift. ?  ?Vascular: No hyperdense vessel.  Atherosclerotic calcifications. ?  ?Skull: Normal. Negative for fracture or focal lesion. ?  ?Sinuses/Orbits: Visualized orbits show no acute finding. Mild ?mucosal thickening within the bilateral maxillary sinuses. ?  ?Other: Ovoid lesions with irregular internal calcification within ?the bilateral parietooccipital scalp, likely reflecting sebaceous ?cysts. ?  ?ASPECTS [facility] Stroke Program Early CT Score) ?  ?- Ganglionic level infarction (caudate, lentiform nuclei, internal ?capsule, insula, M1-M3 cortex): 7 ?  ?- Supraganglionic infarction (M4-M6 cortex): 3 ?  ?Total score (0-10 with 10 being normal): 10 ?  ?These results were communicated to Dr. Quinn Axe At 1:07 pmon ?4/13/2023by text page via the [facility] messaging system. ?  ?IMPRESSION: ?No evidence of acute intracranial abnormality. ?  ?Mild chronic small vessel ischemic changes  within the cerebral white ?matter. ?  ?Mild generalized cerebral atrophy. ? ?CLINICAL DATA:  Acute neuro deficit.  Vertigo ?  ?EXAM: ?CT ANGIOGRAPHY HEAD AND NECK ?  ?CT PERFUSION BRAIN ?  ?TECHNIQUE: ?Multidetector CT imaging of the head and neck was performed using ?the standard protocol during bolus administration of intravenous ?contrast. Multiplanar CT image reconstructions and MIPs were ?obtained to evaluate the vascular anatomy. Carotid stenosis ?measurements (when applicable) are obtained utilizing NASCET ?criteria, using the distal internal carotid diameter as the ?denominator. ?  ?Multiphase CT imaging of the brain was performed following IV bolus ?contrast injection. Subsequent parametric perfusion maps were ?calculated using RAPID software. ?  ?RADIATION DOSE REDUCTION: This exam was performed according to the ?departmental dose-optimization program which includes automated ?exposure control, adjustment of the mA and/or kV according to ?patient size and/or use of iterative reconstruction technique. ?   ?CONTRAST:  165mL OMNIPAQUE IOHEXOL 350 MG/ML SOLN ?  ?COMPARISON:  None. ?  ?FINDINGS: ?CTA NECK FINDINGS ?  ?Aortic arch: Standard branching. Imaged portion shows no evidence of ?aneurysm or dissection. No significan

## 2021-05-06 LAB — CBC WITH DIFFERENTIAL/PLATELET
Basophils Absolute: 0 10*3/uL (ref 0.0–0.2)
Basos: 0 %
EOS (ABSOLUTE): 0.2 10*3/uL (ref 0.0–0.4)
Eos: 3 %
Hematocrit: 41.9 % (ref 34.0–46.6)
Hemoglobin: 15.1 g/dL (ref 11.1–15.9)
Immature Grans (Abs): 0 10*3/uL (ref 0.0–0.1)
Immature Granulocytes: 1 %
Lymphocytes Absolute: 2 10*3/uL (ref 0.7–3.1)
Lymphs: 27 %
MCH: 34.2 pg — ABNORMAL HIGH (ref 26.6–33.0)
MCHC: 36 g/dL — ABNORMAL HIGH (ref 31.5–35.7)
MCV: 95 fL (ref 79–97)
Monocytes Absolute: 0.7 10*3/uL (ref 0.1–0.9)
Monocytes: 9 %
Neutrophils Absolute: 4.6 10*3/uL (ref 1.4–7.0)
Neutrophils: 60 %
Platelets: 240 10*3/uL (ref 150–450)
RBC: 4.42 x10E6/uL (ref 3.77–5.28)
RDW: 14.7 % (ref 11.7–15.4)
WBC: 7.5 10*3/uL (ref 3.4–10.8)

## 2021-05-06 LAB — BASIC METABOLIC PANEL
BUN/Creatinine Ratio: 13 (ref 12–28)
BUN: 17 mg/dL (ref 8–27)
CO2: 20 mmol/L (ref 20–29)
Calcium: 10.3 mg/dL (ref 8.7–10.3)
Chloride: 97 mmol/L (ref 96–106)
Creatinine, Ser: 1.3 mg/dL — ABNORMAL HIGH (ref 0.57–1.00)
Glucose: 181 mg/dL — ABNORMAL HIGH (ref 70–99)
Potassium: 3.8 mmol/L (ref 3.5–5.2)
Sodium: 135 mmol/L (ref 134–144)
eGFR: 45 mL/min/{1.73_m2} — ABNORMAL LOW (ref >59)

## 2021-05-09 ENCOUNTER — Telehealth: Payer: Self-pay

## 2021-05-09 NOTE — Progress Notes (Signed)
I have contacted [facility] about the status on patients Trulciity medication. The representative has sent a order request to the pharmacy and states the pharmacy will be sending out the medication out with in next week and should hopefully receive the medication to patients home with 1-2 weeks. I have left a voicemail to the patient and left my call back number if needed.  ? ?Rance Muir, RMA ?Health Concierge ? ?

## 2021-05-12 ENCOUNTER — Telehealth: Payer: Self-pay

## 2021-05-12 NOTE — Chronic Care Management (AMB) (Signed)
?  Care Management  ? ?Note ? ?05/12/2021 ?Name: Christie Bryant MRN: [id number] DOB: [birth year] ? ?Christie Bryant is a 66 y.o. year old female who is a primary care patient of Dorcas Carrow, DO and is actively engaged with the care management team. I reached out to [employer] by phone today to assist with re-scheduling a follow up visit with the Pharmacist ? ?Follow up plan: ?Unsuccessful telephone outreach attempt made. A HIPAA compliant phone message was left for the patient providing contact information and requesting a return call.  ?The care management team will reach out to the patient again over the next 7 days.  ?If patient returns call to provider office, please advise to call Embedded Care Management Care Guide [name]  at [phone] ? ?[name], RMA ?Care Guide, Embedded Care Coordination ?[place]  Care Management  ?[place], [place] [address] ?Direct Dial: [phone] ?[email or web address] ?[email or web address]: [email or web address]  ? ?

## 2021-05-12 NOTE — Chronic Care Management (AMB) (Signed)
?  Care Management  ? ?Note ? ?05/12/2021 ?Name: Christie Bryant MRN: [id number] DOB: 1955-11-28 ? ?Christie Bryant is a 66 y.o. year old female who is a primary care patient of Dorcas Carrow, DO and is actively engaged with the care management team. I reached out to [employer] by phone today to assist with re-scheduling a follow up visit with the Pharmacist ? ?Follow up plan: ?Telephone appointment with care management team member scheduled for:05/26/2021 ? ?[name], RMA ?Care Guide, Embedded Care Coordination ?[place]  Care Management  ?[place], [place] [address] ?Direct Dial: [phone] ?[email or web address] ?Website: [email or web address]  ? ?

## 2021-05-14 DIAGNOSIS — I10 Essential (primary) hypertension: Secondary | ICD-10-CM | POA: Diagnosis not present

## 2021-05-14 DIAGNOSIS — E1140 Type 2 diabetes mellitus with diabetic neuropathy, unspecified: Secondary | ICD-10-CM | POA: Diagnosis not present

## 2021-05-14 DIAGNOSIS — Z7985 Long-term (current) use of injectable non-insulin antidiabetic drugs: Secondary | ICD-10-CM | POA: Diagnosis not present

## 2021-05-14 DIAGNOSIS — G709 Myoneural disorder, unspecified: Secondary | ICD-10-CM | POA: Diagnosis not present

## 2021-05-14 DIAGNOSIS — N189 Chronic kidney disease, unspecified: Secondary | ICD-10-CM | POA: Diagnosis not present

## 2021-05-14 DIAGNOSIS — Z794 Long term (current) use of insulin: Secondary | ICD-10-CM | POA: Diagnosis not present

## 2021-05-14 DIAGNOSIS — I69398 Other sequelae of cerebral infarction: Secondary | ICD-10-CM | POA: Diagnosis not present

## 2021-05-14 DIAGNOSIS — E876 Hypokalemia: Secondary | ICD-10-CM | POA: Diagnosis not present

## 2021-05-14 DIAGNOSIS — R42 Dizziness and giddiness: Secondary | ICD-10-CM | POA: Diagnosis not present

## 2021-05-14 DIAGNOSIS — E1122 Type 2 diabetes mellitus with diabetic chronic kidney disease: Secondary | ICD-10-CM | POA: Diagnosis not present

## 2021-05-15 ENCOUNTER — Other Ambulatory Visit (INDEPENDENT_AMBULATORY_CARE_PROVIDER_SITE_OTHER): Payer: HMO | Admitting: Family Medicine

## 2021-05-15 DIAGNOSIS — Z7985 Long-term (current) use of injectable non-insulin antidiabetic drugs: Secondary | ICD-10-CM

## 2021-05-15 DIAGNOSIS — I69398 Other sequelae of cerebral infarction: Secondary | ICD-10-CM

## 2021-05-15 DIAGNOSIS — W19XXXD Unspecified fall, subsequent encounter: Secondary | ICD-10-CM

## 2021-05-15 DIAGNOSIS — R42 Dizziness and giddiness: Secondary | ICD-10-CM

## 2021-05-15 DIAGNOSIS — E876 Hypokalemia: Secondary | ICD-10-CM

## 2021-05-15 DIAGNOSIS — I10 Essential (primary) hypertension: Secondary | ICD-10-CM

## 2021-05-15 DIAGNOSIS — Z794 Long term (current) use of insulin: Secondary | ICD-10-CM | POA: Insufficient documentation

## 2021-05-15 DIAGNOSIS — N189 Chronic kidney disease, unspecified: Secondary | ICD-10-CM

## 2021-05-15 DIAGNOSIS — E1142 Type 2 diabetes mellitus with diabetic polyneuropathy: Secondary | ICD-10-CM

## 2021-05-15 DIAGNOSIS — G709 Myoneural disorder, unspecified: Secondary | ICD-10-CM

## 2021-05-15 DIAGNOSIS — E785 Hyperlipidemia, unspecified: Secondary | ICD-10-CM

## 2021-05-15 DIAGNOSIS — E039 Hypothyroidism, unspecified: Secondary | ICD-10-CM

## 2021-05-15 NOTE — Progress Notes (Signed)
Received home health orders orders from [facility]. ?Start of care 04/30/21.   Certification and orders from 04/30/21 through 06/28/21 are reviewed, signed and faxed back to home health company. ? ?Need of intermittent skilled services at home: Decrease fall risk, Physical therapy after stroke, vitals monitoring following hospitalization and stroke.  ? ?The home health care plan has been established by me and will be reviewed and updated as needed to maximize patient recovery.  I certify that all home health services have been and will be furnished to the patient while under my care. ? ?Face-to-face encounter in which the need for home health services was established: 04/28/21 at discharge from [facility]. See discharge summary ? ?Patient is receiving home health services for the following diagnoses: ?Problem List Items Addressed This Visit   ? ?  ? Cardiovascular and Mediastinum  ? Hypertension (Chronic)  ?  ? Endocrine  ? 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)  ? Hypothyroidism  ?  ? Nervous and Auditory  ? Myoneural disorder ([place])  ?  ? Other  ? Hyperlipidemia with target low density lipoprotein (LDL) cholesterol less than 70 mg/dL  ? Hypokalemia  ? Long term (current) use of insulin ([place])  ? ?Other Visit Diagnoses   ? ? Other sequelae of cerebral infarction    -  Primary  ? Dizziness      ? Chronic kidney disease, unspecified CKD stage      ? Long-term (current) use of injectable non-insulin antidiabetic drugs      ? Fall, subsequent encounter      ? ?  ? ? ? ?Park Liter, DO  ? ?

## 2021-05-16 ENCOUNTER — Encounter: Payer: Self-pay | Admitting: Family Medicine

## 2021-05-20 ENCOUNTER — Encounter: Payer: Self-pay | Admitting: Diagnostic Neuroimaging

## 2021-05-20 ENCOUNTER — Ambulatory Visit: Payer: HMO | Admitting: Diagnostic Neuroimaging

## 2021-05-20 VITALS — BP 119/74 | HR 75 | Ht 70.0 in | Wt 212.4 lb

## 2021-05-20 DIAGNOSIS — E1142 Type 2 diabetes mellitus with diabetic polyneuropathy: Secondary | ICD-10-CM | POA: Diagnosis not present

## 2021-05-20 DIAGNOSIS — I63442 Cerebral infarction due to embolism of left cerebellar artery: Secondary | ICD-10-CM

## 2021-05-20 DIAGNOSIS — I639 Cerebral infarction, unspecified: Secondary | ICD-10-CM | POA: Diagnosis not present

## 2021-05-20 NOTE — Patient Instructions (Signed)
?  EMBOLIC LEFT CEREBELLAR STROKE (cryptogenic; also punctate infarcts in left parietal and occipital regions) ? ?- continue aspirin 81mg  daily, atorvastatin 80, metformin, insulin, metoprolol, maxide ? ?- refer to TEE if negative then implanted loop recorder ?

## 2021-05-20 NOTE — Progress Notes (Signed)
? ?[facility] ? ?PATIENT: Christie Bryant ?DOB: 04-08-1955 ? ?REFERRING CLINICIAN: Johnson, Megan P, DO ?HISTORY FROM: patient ?REASON FOR VISIT: new consult  ? ? ?HISTORICAL ? ?CHIEF COMPLAINT:  ?Chief Complaint  ?Patient presents with  ? History of Stroke  ?  [facility]  "completed PT, doing well, still some dizziness, some walking issues, woozy"   ? ? ?HISTORY OF PRESENT ILLNESS:  ? ?66 year old female here for evaluation of stroke.  History of hypertension, diabetes, hyperlipidemia. ? ?04/24/2021 patient was at home when all of a sudden she had sudden severe vertigo, nausea, gait difficulty.  She was having difficulty standing up.  Patient went to the hospital for evaluation.  Stroke work-up was completed and patient was found to have left medial cerebellar ischemic infarction.  Punctate infarcts also noted in the left occipital and parietal regions.  Patient was treated with aspirin Plavix for 21 days and now on aspirin 81 mg daily.  Patient was not on antiplatelet before the stroke. ? ?Since that time symptoms have greatly improved.  She only has very mild residual swimmy headedness. ? ? ? ?REVIEW OF SYSTEMS: Full 14 system review of systems performed and negative with exception of: as per HPI. ? ?ALLERGIES: ?No Known Allergies ? ?HOME MEDICATIONS: ?Outpatient Medications Prior to Visit  ?Medication Sig Dispense Refill  ? acetaminophen (TYLENOL) 325 MG tablet Take 2 tablets (650 mg total) by mouth every 6 (six) hours as needed for mild pain (or Fever >/= 101).    ? aspirin 81 MG chewable tablet Chew 1 tablet (81 mg total) by mouth daily.    ? atorvastatin (LIPITOR) 80 MG tablet Take 1 tablet (80 mg total) by mouth at bedtime. 90 tablet 0  ? Cholecalciferol (VITAMIN D-3 PO) Take 2,000 Units by mouth daily.     ? docusate sodium (COLACE) 100 MG capsule Take 100 mg by mouth daily.     ? Insulin Pen Needle (PEN NEEDLES 3/16") 31G X 5 MM MISC 1 each by Does not apply  route once a week. 100 each 12  ? levothyroxine (SYNTHROID) 125 MCG tablet TAKE 1 TABLET BY MOUTH ONCE DAILY ON  AN  EMPTY  STOMACH  WITH  A  GLASS  OF  WATER  AT  LEAST  30  TO  60  MINUTES  BEFORE  BREAKFAST    ? meclizine (ANTIVERT) 25 MG tablet Take 1 tablet (25 mg total) by mouth 3 (three) times daily as needed for dizziness. 90 tablet 6  ? metFORMIN (GLUCOPHAGE) 1000 MG tablet Take 1 tablet (1,000 mg total) by mouth 2 (two) times daily with a meal. 180 tablet 1  ? metoprolol tartrate (LOPRESSOR) 100 MG tablet Take 1 tablet (100 mg total) by mouth 2 (two) times daily. 180 tablet 1  ? nortriptyline (PAMELOR) 10 MG capsule Take 1 capsule (10 mg total) by mouth at bedtime. 90 capsule 1  ? omeprazole (PRILOSEC) 20 MG capsule Take 20 mg by mouth daily.    ? TRESIBA FLEXTOUCH 200 UNIT/ML FlexTouch Pen Inject 0-40 Units into the skin at bedtime.    ? triamterene-hydrochlorothiazide (MAXZIDE) 75-50 MG tablet Take 1/2 (one-half) tablet by mouth once daily 45 tablet 1  ? clopidogrel (PLAVIX) 75 MG tablet Take 1 tablet (75 mg total) by mouth daily for 21 days. 21 tablet 0  ? ?No facility-administered medications prior to visit.  ? ? ?PAST MEDICAL HISTORY: ?Past Medical History:  ?Diagnosis Date  ?  Chicken pox   ? Chronic kidney disease   ? Stones  ? Diabetes mellitus 2012  ? Dr. Tedd Sias  ? Hyperlipidemia   ? Hypertension   ? Neuromuscular disorder (HCC)   ? Recurrent boils   ? Stroke (HCC) 04/24/2021  ? Thyroid disease   ? ? ?PAST SURGICAL HISTORY: ?Past Surgical History:  ?Procedure Laterality Date  ? BIOPSY THYROID    ? normal, Dr. Tedd Sias  ? BREAST REDUCTION SURGERY    ? COLONOSCOPY WITH PROPOFOL N/A 08/13/2014  ? Procedure: COLONOSCOPY WITH PROPOFOL;  Surgeon: Wallace Cullens, MD;  Location: [facility] ENDOSCOPY;  Service: Gastroenterology;  Laterality: N/A;  ? COLONOSCOPY WITH PROPOFOL N/A 11/12/2014  ? Procedure: COLONOSCOPY WITH PROPOFOL;  Surgeon: Wallace Cullens, MD;  Location: [facility] ENDOSCOPY;  Service: Gastroenterology;  Laterality:  N/A;  ? COSMETIC SURGERY    ? gluteal abscess drainage  03/17/2013  ? TOTAL ABDOMINAL HYSTERECTOMY W/ BILATERAL SALPINGOOPHORECTOMY  01/13/1996  ? complete  ? TUBAL LIGATION    ? ? ?FAMILY HISTORY: ?Family History  ?Problem Relation Age of Onset  ? Cancer Mother   ?     ovarian/uterus  ? Hyperlipidemia Father   ? Heart disease Father   ? Hypertension Father   ? Cancer Father   ?     throat  ? Hypertension Sister   ? Hypertension Sister   ? Hypertension Sister   ? Liver disease Sister   ? Heart disease Brother   ? Hypertension Brother   ? Stroke Maternal Grandmother   ? Hypertension Maternal Grandmother   ? Hypertension Maternal Grandfather   ? Pancreatic cancer Maternal Grandfather   ? Stroke Paternal Grandmother   ? Stroke Paternal Grandfather   ? Hypertension Other   ? Heart disease Other   ? Sudden death Other   ? Diabetes Other   ? ? ?SOCIAL HISTORY: ?Social History  ? ?Socioeconomic History  ? Marital status: Married  ?  Spouse name: Leonette Most  ? Number of children: 2  ? Years of education: Not on file  ? Highest education level: Some college, no degree  ?Occupational History  ? Not on file  ?Tobacco Use  ? Smoking status: Never  ? Smokeless tobacco: Never  ?Vaping Use  ? Vaping Use: Never used  ?Substance and Sexual Activity  ? Alcohol use: No  ? Drug use: No  ? Sexual activity: Not Currently  ?Other Topics Concern  ? Not on file  ?Social History Narrative  ? Lives in [place] with husband, from [place].   ? Work - Hospice  ? ?Social Determinants of Health  ? ?Financial Resource Strain: Low Risk   ? Difficulty of Paying Living Expenses: Not hard at all  ?Food Insecurity: No Food Insecurity  ? Worried About Programme researcher, broadcasting/film/video in the Last Year: Never true  ? Ran Out of Food in the Last Year: Never true  ?Transportation Needs: No Transportation Needs  ? Lack of Transportation (Medical): No  ? Lack of Transportation (Non-Medical): No  ?Physical Activity: Inactive  ? Days of Exercise per Week: 0 days  ?  Minutes of Exercise per Session: 0 min  ?Stress: No Stress Concern Present  ? Feeling of Stress : Not at all  ?Social Connections: Moderately Integrated  ? Frequency of Communication with Friends and Family: More than three times a week  ? Frequency of Social Gatherings with Friends and Family: Once a week  ? Attends Religious Services: More than 4 times per year  ?  Active Member of Clubs or Organizations: No  ? Attends BankerClub or Organization Meetings: Never  ? Marital Status: Married  ?Intimate Partner Violence: Not At Risk  ? Fear of Current or Ex-Partner: No  ? Emotionally Abused: No  ? Physically Abused: No  ? Sexually Abused: No  ? ? ? ?PHYSICAL EXAM ? ?GENERAL EXAM/CONSTITUTIONAL: ?Vitals:  ?Vitals:  ? 05/20/21 1114  ?BP: 119/74  ?Pulse: 75  ?Weight: 212 lb 6.4 oz (96.3 kg)  ?Height: 5\' 10"  (1.778 m)  ? ?Body mass index is 30.48 kg/m?. ?Wt Readings from Last 3 Encounters:  ?05/20/21 212 lb 6.4 oz (96.3 kg)  ?05/05/21 213 lb 9.6 oz (96.9 kg)  ?04/24/21 209 lb 7 oz (95 kg)  ? ?Patient is in no distress; well developed, nourished and groomed; neck is supple ? ?CARDIOVASCULAR: ?Examination of carotid arteries is normal; no carotid bruits ?Regular rate and rhythm, no murmurs ?Examination of peripheral vascular system by observation and palpation is normal ? ?EYES: ?Ophthalmoscopic exam of optic discs and posterior segments is normal; no papilledema or hemorrhages ?No results found. ? ?MUSCULOSKELETAL: ?Gait, strength, tone, movements noted in Neurologic exam below ? ?NEUROLOGIC: ?MENTAL STATUS:  ?   ? View : No data to display.  ?  ?  ?  ? ?awake, alert, oriented to person, place and time ?recent and remote memory intact ?normal attention and concentration ?language fluent, comprehension intact, naming intact ?fund of knowledge appropriate ? ?CRANIAL NERVE:  ?2nd - no papilledema on fundoscopic exam ?2nd, 3rd, 4th, 6th - pupils equal and reactive to light, visual fields full to confrontation, extraocular muscles  intact, no nystagmus ?5th - facial sensation symmetric ?7th - facial strength symmetric ?8th - hearing intact ?9th - palate elevates symmetrically, uvula midline ?11th - shoulder shrug symmetric ?12th - ton

## 2021-05-26 ENCOUNTER — Telehealth: Payer: Self-pay

## 2021-05-26 ENCOUNTER — Ambulatory Visit: Payer: HMO

## 2021-05-26 DIAGNOSIS — E1142 Type 2 diabetes mellitus with diabetic polyneuropathy: Secondary | ICD-10-CM

## 2021-05-26 DIAGNOSIS — I10 Essential (primary) hypertension: Secondary | ICD-10-CM

## 2021-05-26 DIAGNOSIS — E785 Hyperlipidemia, unspecified: Secondary | ICD-10-CM

## 2021-05-26 NOTE — Progress Notes (Signed)
I have completed the dose change on patients Trulicity patient assistance application to 3 mg. I have also faxed the page to patient's PCP Valerie Roys office at [phone].   Corrie Mckusick, [place]

## 2021-05-26 NOTE — Progress Notes (Signed)
? ?Chronic Care Management ?Pharmacy Note ? ?05/26/2021 ?Name:  LIANY MUMPOWER MRN:  [id number] DOB:  May 25, 1955 ? ?Summary: ?Hx of stroke. Patient to be considered for loop recorder through cardiology. Recommend lipid panel 06/02/21 pcp visit - will be just past 4 weeks on atorvastatin 80 mg daily at this point. Target LDL <55, next step would be addition of zetia  ?DM. Confirmed that patient is actively receiving trulicity 1.5 mg once weekly. HC to fax RX to PCP for signature on trulicity 24m once weekly to see if we can get that approved ?CP f/u tel visit 11/2021 ? ?Subjective: ?TALAZAE CRYMESis an 66y.o. year old female who is a primary patient of JValerie Roys DO.  The CCM team was consulted for assistance with disease management and care coordination needs.   ? ?Engaged with patient by telephone for follow up visit in response to provider referral for pharmacy case management and/or care coordination services.  ? ?Consent to Services:  ?The patient was given information about Chronic Care Management services, agreed to services, and gave verbal consent prior to initiation of services.  Please see initial visit note for detailed documentation.  ? ?Patient Care Team: ?JValerie Roys DO as PCP - General (Family Medicine) ?SChristene Lye MD (General Surgery) ?WJackolyn Confer MD (Internal Medicine) ?SJudi Cong MD as Physician Assistant (Endocrinology) ?[facility]in [facility](Ph[facility]) ? ?Hospital visits: ?Medication Reconciliation was completed by comparing discharge summary, patient?s EMR and Pharmacy list, and upon discussion with patient. ? ?Admitted to the hospital on 04/2021 due to ischemic stroke.  ? ?[facility] ?-started atorvastatin 80 mg, ASA 81 mg indefinitely, Plavix x 3 weeks due to stroke hx ? ?Medication Changes at Hospital Discharge: ?-Changed n/a ? ?Medications Discontinued at Hospital Discharge: ?-Stopped pravastatin due to starting on  atorvastatin ? ?Medications that remain the same after Hospital Discharge:??  ?-All other medications will remain the same.   ? ?Care Gaps ?-None noted  ? ?[facility] ?-no gaps noted ? ?Objective: ? ?Lab Results  ?Component Value Date  ? CREATININE 1.30 (H) 05/05/2021  ? CREATININE 1.00 04/28/2021  ? CREATININE 1.00 04/27/2021  ? ? ?Lab Results  ?Component Value Date  ? HGBA1C 5.9 (H) 04/24/2021  ? HGBA1C 6.6 (H) 02/04/2021  ? HGBA1C 7.4 (H) 09/30/2020  ? ?Last diabetic Eye exam:  ?Lab Results  ?Component Value Date/Time  ? HMDIABEYEEXA No Retinopathy 02/13/2021 12:00 AM  ?  ?Last diabetic Foot exam:  ?Lab Results  ?Component Value Date/Time  ? HMDIABFOOTEX Normal 11/20/2013 12:00 AM  ?  ? ?   ?Component Value Date/Time  ? CHOL 199 11/20/2013 0838  ? CHOL 147 03/16/2013 0801  ? CHOL 203 (H) 12/05/2012 0933  ? TRIG 151.0 (H) 11/20/2013 05027 ? TRIG 82.0 03/16/2013 0801  ? TRIG 131.0 12/05/2012 0933  ? HDL 37.20 (L) 11/20/2013 07412 ? HDL 42.30 03/16/2013 0801  ? HDL 47.10 12/05/2012 0933  ? CHOLHDL 5 11/20/2013 0838  ? VLDL 30.2 11/20/2013 0838  ? LL[place]132 (H) 11/20/2013 08786 ? L[place]88 03/16/2013 0801  ? LDLDIRECT 104.5 (H) 04/24/2021 1843  ? LDLDIRECT 142.6 12/05/2012 0933  ? ? ? ?  Latest Ref Rng & Units 04/24/2021  ? 10:51 AM 10/06/2019  ? 11:49 PM 11/20/2013  ?  8:38 AM  ?Hepatic Function  ?Total Protein 6.5 - 8.1 g/dL 7.4   7.7   7.5    ?Albumin 3.5 - 5.0 g/dL  3.9   3.8   3.5    ?AST 15 - 41 U/L _0 ?ALT 0 - 44 U/L _1 ?Alk Phosphatase 38 - 126 U/L 51   62   64    ?Total Bilirubin 0.3 - 1.2 mg/dL 1.3   1.5   1.0    ?Bilirubin, Direct 0.0 - 0.2 mg/dL 0.2      ? ? ?Lab Results  ?Component Value Date/Time  ? TSH 6.71 (H) 03/16/2013 08:01 AM  ? TSH 6.96 (H) 01/16/2013 08:30 AM  ? FREET4 0.95 01/16/2013 08:30 AM  ? ? ? ?  Latest Ref Rng & Units 05/05/2021  ?  1:41 PM 04/28/2021  ?  4:48 AM 04/27/2021  ?  6:09 AM  ?CBC  ?WBC 3.4 - 10.8 x10E3/uL 7.5   7.6   6.9    ?Hemoglobin 11.1 - 15.9 g/dL  15.1   15.4   14.1    ?Hematocrit 34.0 - 46.6 % 41.9   43.7   40.2    ?Platelets 150 - 450 x10E3/uL 240   195   187    ? ? ?No results found for: VD25OH ? ?Clinical ASCVD:  ?The ASCVD Risk score (Arnett DK, et al., 2019) failed to calculate for the following reasons: ?  The patient has a prior MI or stroke diagnosis   ?Social History  ? ?Tobacco Use  ?Smoking Status Never  ?Smokeless Tobacco Never  ? ?BP Readings from Last 3 Encounters:  ?05/20/21 119/74  ?05/05/21 102/69  ?04/28/21 127/75  ? ?Pulse Readings from Last 3 Encounters:  ?05/20/21 75  ?05/05/21 73  ?04/28/21 70  ? ?Wt Readings from Last 3 Encounters:  ?05/20/21 212 lb 6.4 oz (96.3 kg)  ?05/05/21 213 lb 9.6 oz (96.9 kg)  ?04/24/21 209 lb 7 oz (95 kg)  ? ?BMI Readings from Last 3 Encounters:  ?05/20/21 30.48 kg/m?  ?05/05/21 32.48 kg/m?  ?04/24/21 31.84 kg/m?  ? ? ?Assessment: Review of patient past medical history, allergies, medications, health status, including review of consultants reports, laboratory and other test data, was performed as part of comprehensive evaluation and provision of chronic care management services.  ? ?SDOH:  (Social Determinants of Health) assessments and interventions performed: yes, today.  ?SDOH Interventions   ? ?Flowsheet Row Most Recent Value  ?SDOH Interventions   ?Food Insecurity Interventions Intervention Not Indicated  ?Transportation Interventions Intervention Not Indicated  ? ?  ? ? ?SDOH Interventions   ? ?Flowsheet Row Most Recent Value  ?SDOH Interventions   ?Food Insecurity Interventions Intervention Not Indicated  ?Transportation Interventions Intervention Not Indicated  ? ?  ? ? ?[place] ? ?No Known Allergies ? ?Medications Reviewed Today   ? ? Reviewed by [name], [facility] (Pharmacist) on 05/26/21 at 1145  Med List Status: <None>  ? ?Medication Order Taking? Sig Documenting Provider Last Dose Status Informant  ?acetaminophen (TYLENOL) 325 MG tablet [id number] Yes Take 2 tablets (650 mg total) by mouth  every 6 (six) hours as needed for mild pain (or Fever >/= 101). Terrilee Croak, MD  Active Self  ?aspirin 81 MG chewable tablet [id number] Yes Chew 1 tablet (81 mg total) by mouth daily. Enzo Bi, MD  Active   ?atorvastatin (LIPITOR) 80 MG tablet [id number] Yes Take 1 tablet (80 mg total) by mouth at bedtime. Enzo Bi, MD  Active   ?Cholecalciferol (VITAMIN D-3 PO) [id number]  Take  2,000 Units by mouth daily.  [provider]  Active Self  ?docusate sodium (COLACE) 100 MG capsule [id number]  Take 100 mg by mouth daily.  [provider]  Active Self  ?Insulin Pen Needle (PEN NEEDLES 3/16") 31G X 5 MM MISC [id number]  1 each by Does not apply route once a week. Park Liter P, DO  Active Self  ?levothyroxine (SYNTHROID) 125 MCG tablet [id number] Yes TAKE 1 TABLET BY MOUTH ONCE DAILY ON  AN  EMPTY  STOMACH  WITH  A  GLASS  OF  WATER  AT  LEAST  30  TO  60  MINUTES  BEFORE  BREAKFAST [provider]  Active Self  ?meclizine (ANTIVERT) 25 MG tablet [id number]  Take 1 tablet (25 mg total) by mouth 3 (three) times daily as needed for dizziness. Park Liter P, DO  Active   ?         ?Med Note ([place] May 20, 2021 11:15 AM) 05/20/21 taking 2 x a day  ?metFORMIN (GLUCOPHAGE) 1000 MG tablet [id number] Yes Take 1 tablet (1,000 mg total) by mouth 2 (two) times daily with a meal. Wynetta Emery, Megan P, DO  Active   ?metoprolol tartrate (LOPRESSOR) 100 MG tablet [id number] Yes Take 1 tablet (100 mg total) by mouth 2 (two) times daily. Johnson, Megan P, DO  Active   ?nortriptyline (PAMELOR) 10 MG capsule [id number] Yes Take 1 capsule (10 mg total) by mouth at bedtime. Park Liter P, DO  Active Self  ?omeprazole (PRILOSEC) 20 MG capsule [id number] Yes Take 20 mg by mouth daily. [provider]  Active Self  ?TRESIBA FLEXTOUCH 200 UNIT/ML FlexTouch Pen [id number] Yes Inject 0-40 Units into the skin at bedtime. [provider]  Active Self  ?triamterene-hydrochlorothiazide (MAXZIDE) 75-50  MG tablet [id number] Yes Take 1/2 (one-half) tablet by mouth once daily Wynetta Emery, Megan P, DO  Active   ? ?  ?  ? ?  ? ? ?Patient Active Problem List  ? Diagnosis Date Noted  ? 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

## 2021-05-26 NOTE — Patient Instructions (Signed)
Christie Bryant, ? ?Thank you for talking with me today. I have included our care plan/goals in the following pages.  ? ?Please review and call me at [phone] with any questions. ? ?Thanks! ?Christie Bryant  ? ?Christie Bryant, PharmD ?Clinical Pharmacist  ?[phone] ? ?Care Plan : ccm pharmacy care plan  ?Updates made by Christie Bryant, [facility] since 05/26/2021 12:00 AM  ?  ? ?Problem: hld htn dmii hypothyroidism gout stroke hx   ?Priority: High  ?  ? ?Long-Range Goal: disease management   ?Start Date: 09/30/2020  ?Expected End Date: 09/30/2021  ?Recent Progress: On track  ?Priority: High  ?Note:   ? ?Current Barriers:  ?Unable to independently afford treatment regimen ? ?Pharmacist Clinical Goal(s):  ?Patient will verbalize ability to afford treatment regimen ?achieve adherence to monitoring guidelines and medication adherence to achieve therapeutic efficacy through collaboration with PharmD and provider.  ? ?Interventions: ?1:1 collaboration with Christie Carrow, DO regarding development and update of comprehensive plan of care as evidenced by provider attestation and co-signature ?Inter-disciplinary care team collaboration (see longitudinal plan of care) ?Comprehensive medication review performed; medication list updated in electronic medical record ? ?Hypertension (BP goal <130/80) ?-Controlled ?-Current treatment: ?Maxide ?Appropriate, effective, safe, accessible  ?Lopressor ?Query appropriate ?-Medications previously tried:   ?-Current home readings: was in 120s systolic when doing PT recently ?-Current dietary habits: see DM ?-Current exercise habits: see DM ?-Denies hypotensive/hypertensive symptoms ?-Educated on BP goals and benefits of medications for prevention of heart attack, stroke and kidney damage; ?-Counseled to monitor BP at home as directed, document, and provide log at future appointments ?-Counseled on diet and exercise extensively ?Recommended to continue current medication ? ?Hyperlipidemia: (LDL goal <  55 ?-Uncontrolled ?-New start atorvastatin 80 mg post stroke 04/2021 ?-Current treatment: ?Atorvastatin 80 mg once daily  ?Appropriate, effective, safe, accessible  ?-Medications previously tried: pravastatin prior to stroke   ?-Current dietary patterns: see dm ?-Current exercise habits: see dm  ?-Educated on Cholesterol goals;  ?Benefits of statin for ASCVD risk reduction; ?Exercise goal of 150 minutes per week; ?-Recommended to continue current medication ?-Is being considered for loop recorder through cardiology. Do not see indication for beta blocker, will hold off on any changes and review at next visit. ?Recommend lipid panel 06/02/21 pcp visit - will be just past 4 weeks on atorvastatin 80 mg daily at this point ? ?Diabetes (A1c goal <7%) ?-Controlled ?-Current medications: ?Metformin 1000 mg twice daily  ?Appropriate, effective, safe, accessible  ?Christie Bryant  ?Appropriate, effective, safe, accessible  ?Trulicity 1.5 mg once weekly  ?Appropriate, effective, safe, accessible  ?-Medications previously tried:   ?-Current home glucose readings ?May 2023: 145 at night, 95 in AM  ?-Denies hypoglycemic/hyperglycemic symptoms ?-Current meal patterns:  ?May 2023: chocolate as target area. Some fruits and vegetables. Eating less overall - decreased appetite after stroke.  ?-Current exercise:  ?May 2023: just finished PT classes. Some vertigo/balance issues, plans to pick up exercise as this improves.  ?-Educated on A1c and blood sugar goals; ?-Counseled to check feet daily and get yearly eye exams ?-Counseled on diet and exercise extensively ?Recommended to continue current medication ?HC to send Rx for trulicity 3 mg once weekly to PCP ? ?Patient Goals/Self-Care Activities ?Patient will:  ?- take medications as prescribed as evidenced by patient report and record review ?collaborate with provider on medication access solutions ? ?Medication Assistance: Trulicity obtained through [employer] medication assistance program.   Enrollment ends 01/11/2022 ?  ? ? ?The patient verbalized understanding of  instructions provided today and agreed to receive a MyChart copy of patient instruction and/or educational materials. ?Telephone follow up appointment with pharmacy team member scheduled for: See next appointment with "Care Management Staff" under "What's Next" below.    ?

## 2021-05-27 ENCOUNTER — Telehealth: Payer: Self-pay | Admitting: *Deleted

## 2021-05-27 NOTE — Telephone Encounter (Signed)
Transition Care Management Unsuccessful Follow-up Telephone Call ? ?Date of discharge and from where:  [facility] 05-23-2021 ? ?Attempts:  1st Attempt ? ?Reason for unsuccessful TCM follow-up call:  Left voice message ? ?  ?

## 2021-05-27 NOTE — Telephone Encounter (Signed)
Transition Care Management Follow-up Telephone Call ?Date of discharge and from where:  5-12 ?How have you been since you were released from the hospital? Doing good a little dizzy ?Any questions or concerns? No ? ?Items Reviewed: ?Did the pt receive and understand the discharge instructions provided? Yes  ?Medications obtained and verified? Yes  ?Other? No  ?Any new allergies since your discharge? No  ?Dietary orders reviewed? No ?Do you have support at home? Yes  ? ?Home Care and Equipment/Supplies: ?Were home health services ordered? yes ?If so, what is the name of the agency? [facility]  ?Has the agency set up a time to come to the patient's home? yes ?Were any new equipment or medical supplies ordered?  No ?What is the name of the medical supply agency?  ?Were you able to get the supplies/equipment? not applicable ?Do you have any questions related to the use of the equipment or supplies? No ? ?Functional Questionnaire: (I = Independent and D = Dependent) ?ADLs: i ? ?Bathing/Dressing- i ? ?Meal Prep- i ? ?Eating- i ? ?Maintaining continence- i ? ?Transferring/Ambulation- i ? ?Managing Meds- i ? ?Follow up appointments reviewed: ? ?PCP Hospital f/u appt confirmed? Yes  . ?Specialist Hospital f/u appt confirmed? No  Scheduled. ?Are transportation arrangements needed? No  ?If their condition worsens, is the pt aware to call PCP or go to the Emergency Dept.? Yes ?Was the patient provided with contact information for the PCP's office or ED? Yes ?Was to pt encouraged to call back with questions or concerns? yes  ?

## 2021-05-30 IMAGING — MR MR FOOT*L* W/O CM
4 of 5 series · 19 of 40 positions shown · non-contrast
Comparison: X-ray 10/07/2019

CLINICAL DATA: Left foot cellulitis.  Diabetic.

EXAM:
MRI OF THE LEFT FOOT WITHOUT CONTRAST
TECHNIQUE: Multiplanar, multisequence MR imaging of the left forefoot was
performed. No intravenous contrast was administered.

[Series 4: T1 · coronal · 4.0mm · 0.27mm/px · 5 of 35 slices shown (1 of 2)]
[im 1/35]
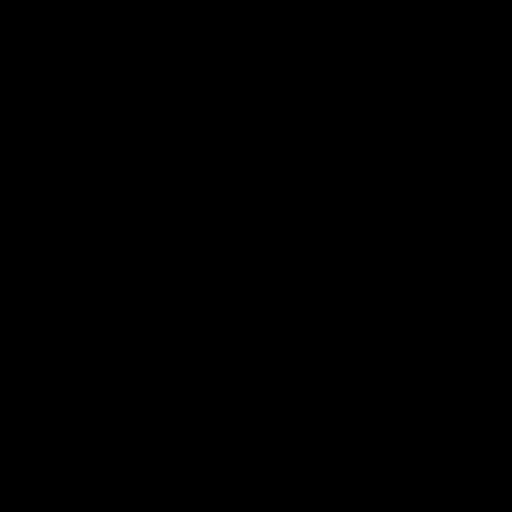
[im 4/35]
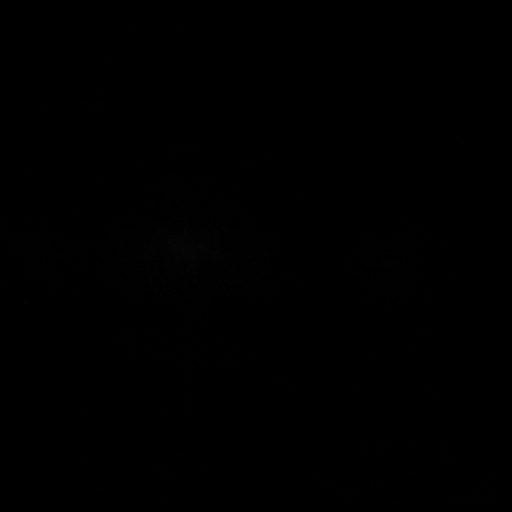
[im 12/35]
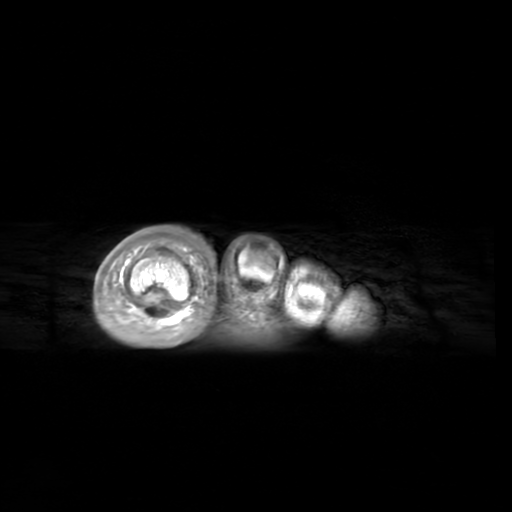
[im 19/35]
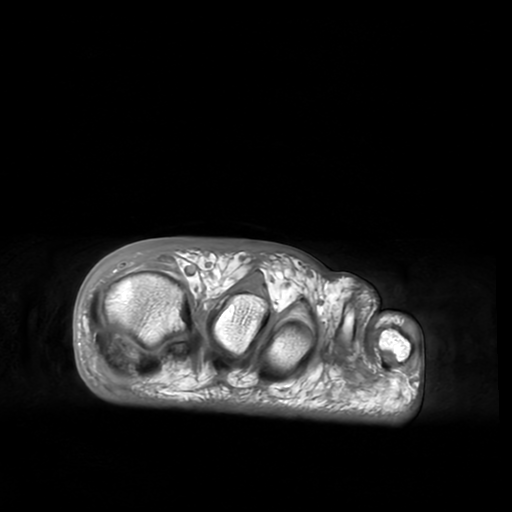
[im 31/35]
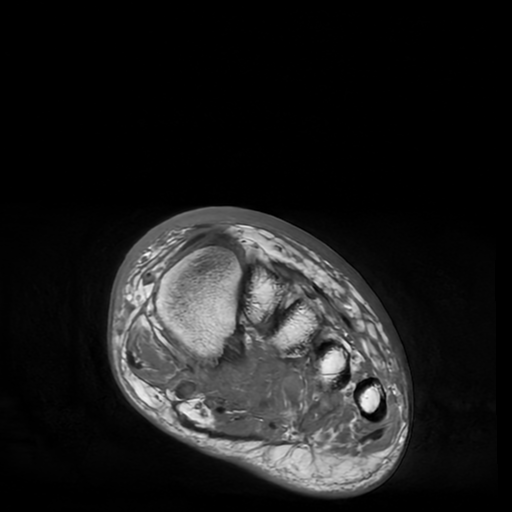

[Series 5: T2 fat-sat · coronal · 4.0mm · 0.27mm/px · 8 of 35 slices shown]
[im 1/35]
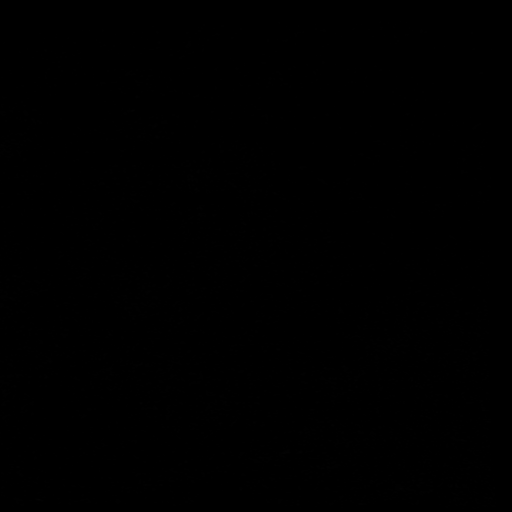
[im 4/35]
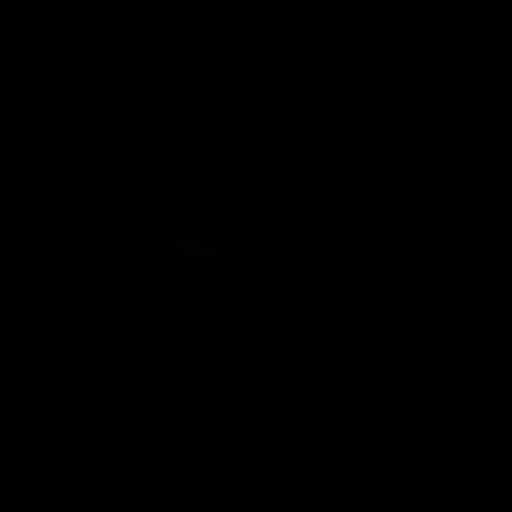
[im 12/35]
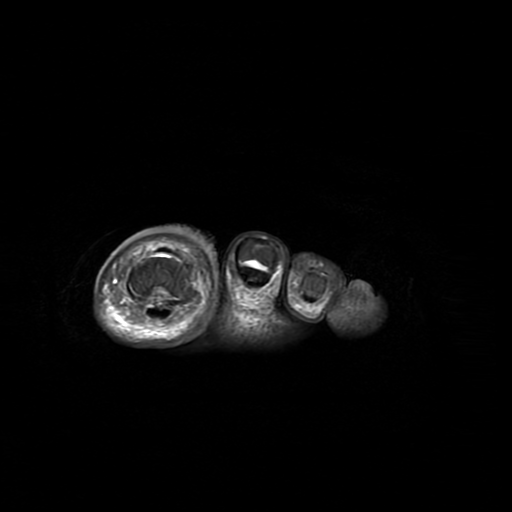
[im 16/35]
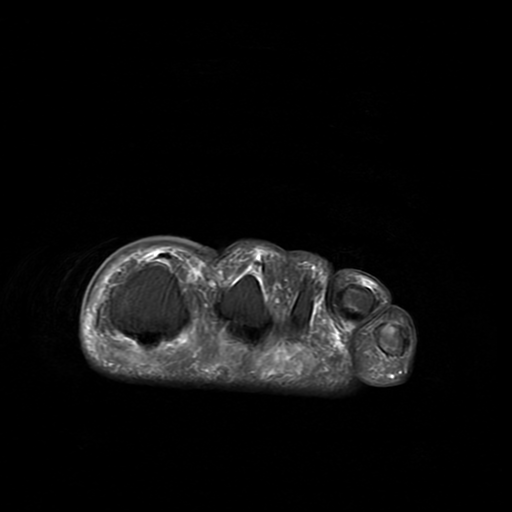
[im 19/35]
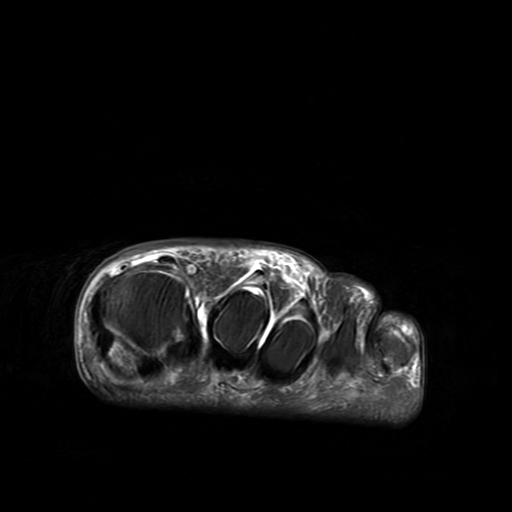
[im 23/35]
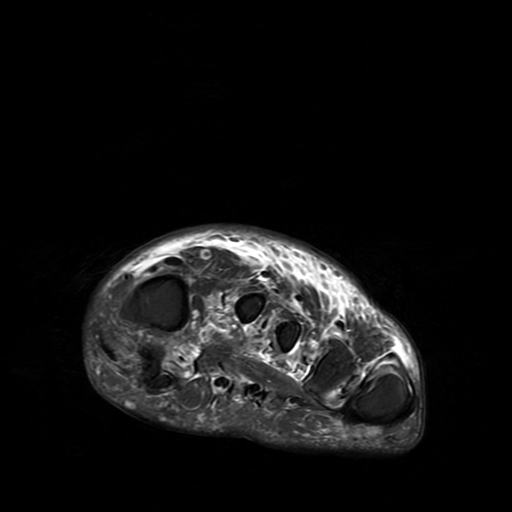
[im 31/35]
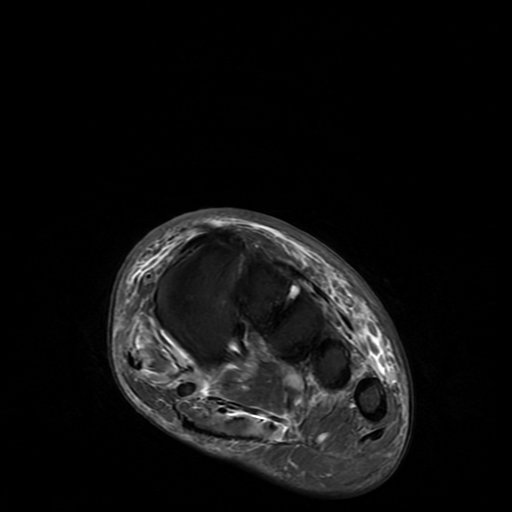
[im 35/35]
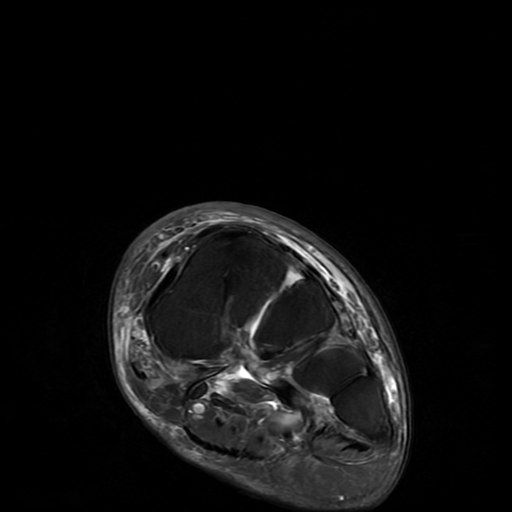

[Series 8: STIR · sagittal · 3.0mm · 0.39mm/px · 3 of 29 slices shown]
[im 5/29]
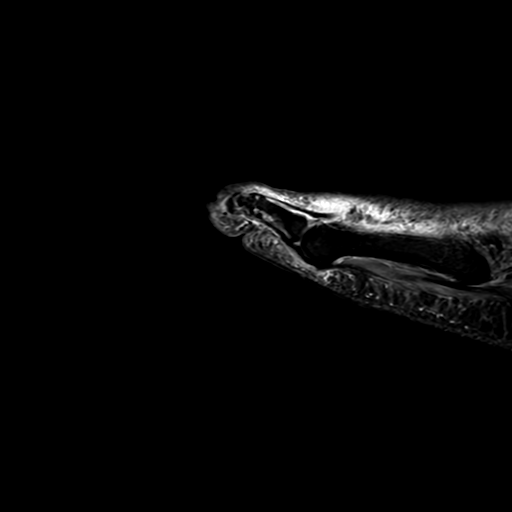
[im 17/29]
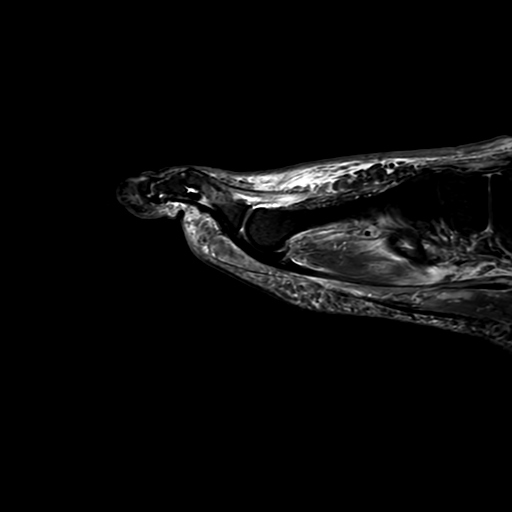
[im 25/29]
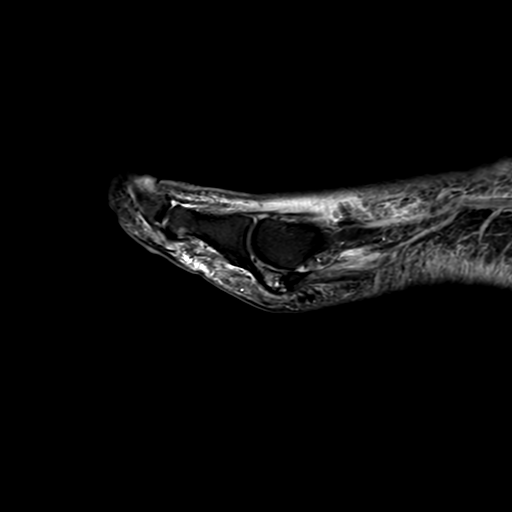

[Series 9: T1 · oblique · 3.0mm · 0.31mm/px · 3 of 19 slices shown (2 of 2)]
[im 4/19]
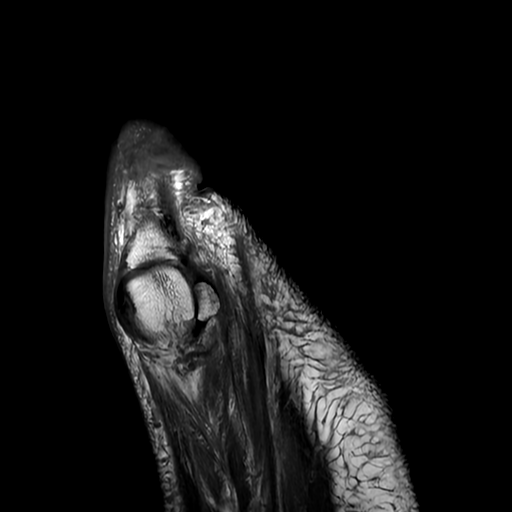
[im 11/19]
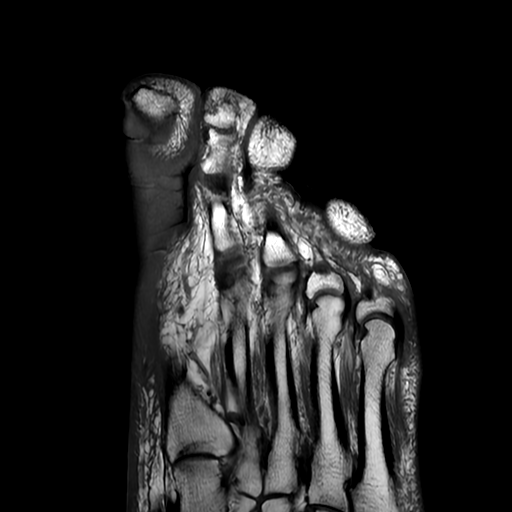
[im 19/19]
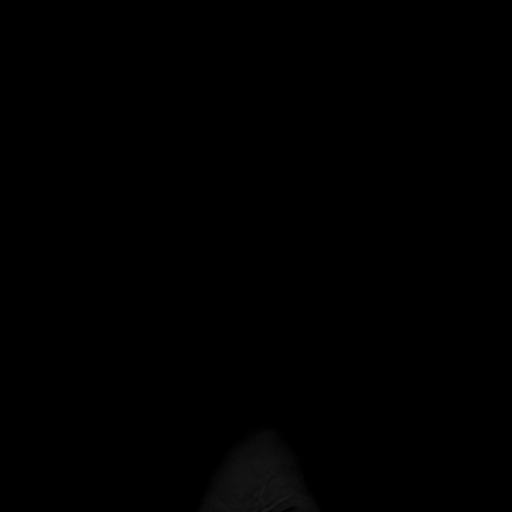

[19 of 40 positions shown; findings below may reference images not displayed]

FINDINGS: Bones/Joint/Cartilage

No acute fracture. No dislocation. No bone marrow edema or
periostitis. Well-defined marginal erosion along the medial aspect
of the first metatarsal head (series 9, image 5) without surrounding
bone marrow edema. No joint effusion. Bipartite medial hallux
sesamoid with mild subcortical marrow signal changes in the distal
fragment. Mild interphalangeal joint space narrowing and marginal
osteophyte formation most pronounced at the fourth and fifth toes.

Ligaments

Intact Lisfranc ligament. Collateral ligaments of the forefoot are
intact.

Muscles and Tendons

Mild fatty infiltration of the intrinsic foot musculature. No
intramuscular fluid collection. Intact flexor and extensor tendons.
No tenosynovitis.

Soft tissues

Nonspecific dorsal subcutaneous edema. No soft tissue ulceration. No
fluid collection.
IMPRESSION: 1. No acute osseous abnormality of the left forefoot. No evidence of
osteomyelitis.
2. Nonspecific dorsal subcutaneous edema. No soft tissue ulceration
or fluid collection.
3. Small well-defined marginal erosion along the medial aspect of
the first metatarsal head without associated bone marrow edema or
inflammatory changes in the adjacent soft tissues. Findings are most
compatible with sequela of a crystalline arthropathy such as gout.
4. Bipartite medial hallux sesamoid with findings of mild
degeneration and/or sesamoiditis.

## 2021-06-02 ENCOUNTER — Encounter: Payer: Self-pay | Admitting: Family Medicine

## 2021-06-02 ENCOUNTER — Ambulatory Visit (INDEPENDENT_AMBULATORY_CARE_PROVIDER_SITE_OTHER): Payer: HMO | Admitting: Family Medicine

## 2021-06-02 VITALS — BP 106/69 | HR 70 | Temp 98.0°F | Wt 214.0 lb

## 2021-06-02 DIAGNOSIS — Z8673 Personal history of transient ischemic attack (TIA), and cerebral infarction without residual deficits: Secondary | ICD-10-CM | POA: Diagnosis not present

## 2021-06-02 DIAGNOSIS — E039 Hypothyroidism, unspecified: Secondary | ICD-10-CM | POA: Diagnosis not present

## 2021-06-02 MED ORDER — TRESIBA FLEXTOUCH 200 UNIT/ML ~~LOC~~ SOPN: 0.0000 [IU] | PEN_INJECTOR | Freq: Every evening | SUBCUTANEOUS | 1 refills | Status: DC

## 2021-06-02 MED ORDER — TRULICITY 3 MG/0.5ML ~~LOC~~ SOAJ: 3.0000 mg | SUBCUTANEOUS | 1 refills | Status: DC

## 2021-06-02 NOTE — Assessment & Plan Note (Signed)
Rechecking levels today. Await results. Treat as needed.  

## 2021-06-02 NOTE — Progress Notes (Signed)
BP 106/69   Pulse 70   Temp 98 F (36.7 C)   Wt 214 lb (97.1 kg)   SpO2 97%   BMI 30.71 kg/m    Subjective:    Patient ID: Christie Bryant, female    DOB: 02/10/1955, 66 y.o.   MRN: [id number]  HPI: [name] is a 66 y.o. female  Chief Complaint  Patient presents with   History of cerebellar stroke    Diabetes   Saw the neurologist and was happy with how that went. The dizziness has been getting better. She notes that the meclizine seems to be helping. She notes that she has most of the issues when she was moving her head quickly. She denies any falling over and issues with her balance. She is otherwise feeling well with no other concerns concerns or complaints at this time.   HYPOTHYROIDISM Thyroid control status:controlled Satisfied with current treatment? yes Medication side effects: no Medication compliance: excellent compliance Etiology of hypothyroidism:  Recent dose adjustment:no Fatigue: no Cold intolerance: no Heat intolerance: no Weight gain: no Weight loss: no Constipation: no Diarrhea/loose stools: no Palpitations: no Lower extremity edema: no Anxiety/depressed mood: no   Relevant past medical, surgical, family and social history reviewed and updated as indicated. Interim medical history since our last visit reviewed. Allergies and medications reviewed and updated.  Review of Systems  Constitutional: Negative.   Respiratory: Negative.    Cardiovascular: Negative.   Musculoskeletal: Negative.   Neurological:  Positive for dizziness. Negative for tremors, seizures, syncope, facial asymmetry, speech difficulty, weakness, light-headedness, numbness and headaches.  Psychiatric/Behavioral: Negative.     Per HPI unless specifically indicated above     Objective:    BP 106/69   Pulse 70   Temp 98 F (36.7 C)   Wt 214 lb (97.1 kg)   SpO2 97%   BMI 30.71 kg/m   Wt Readings from Last 3 Encounters:  06/02/21 214 lb (97.1 kg)  05/20/21 212 lb 6.4  oz (96.3 kg)  05/05/21 213 lb 9.6 oz (96.9 kg)    Physical Exam Vitals and nursing note reviewed.  Constitutional:      General: She is not in acute distress.    Appearance: Normal appearance. She is not ill-appearing, toxic-appearing or diaphoretic.  HENT:     Head: Normocephalic and atraumatic.     Right Ear: External ear normal.     Left Ear: External ear normal.     Nose: Nose normal.     Mouth/Throat:     Mouth: Mucous membranes are moist.     Pharynx: Oropharynx is clear.  Eyes:     General: No scleral icterus.       Right eye: No discharge.        Left eye: No discharge.     Extraocular Movements: Extraocular movements intact.     Conjunctiva/sclera: Conjunctivae normal.     Pupils: Pupils are equal, round, and reactive to light.  Cardiovascular:     Rate and Rhythm: Normal rate and regular rhythm.     Pulses: Normal pulses.     Heart sounds: Normal heart sounds. No murmur heard.   No friction rub. No gallop.  Pulmonary:     Effort: Pulmonary effort is normal. No respiratory distress.     Breath sounds: Normal breath sounds. No stridor. No wheezing, rhonchi or rales.  Chest:     Chest wall: No tenderness.  Musculoskeletal:        General: Normal range  of motion.     Cervical back: Normal range of motion and neck supple.  Skin:    General: Skin is warm and dry.     Capillary Refill: Capillary refill takes less than 2 seconds.     Coloration: Skin is not jaundiced or pale.     Findings: No bruising, erythema, lesion or rash.  Neurological:     General: No focal deficit present.     Mental Status: She is alert and oriented to person, place, and time. Mental status is at baseline.  Psychiatric:        Mood and Affect: Mood normal.        Behavior: Behavior normal.        Thought Content: Thought content normal.        Judgment: Judgment normal.    Results for orders placed or performed in visit on 05/05/21  CBC with Differential/Platelet  Result Value Ref Range    WBC 7.5 3.4 - 10.8 x10E3/uL   RBC 4.42 3.77 - 5.28 x10E6/uL   Hemoglobin 15.1 11.1 - 15.9 g/dL   Hematocrit 41.9 34.0 - 46.6 %   MCV 95 79 - 97 fL   MCH 34.2 (H) 26.6 - 33.0 pg   MCHC 36.0 (H) 31.5 - 35.7 g/dL   RDW 14.7 11.7 - 15.4 %   Platelets 240 150 - 450 x10E3/uL   Neutrophils 60 Not Estab. %   Lymphs 27 Not Estab. %   Monocytes 9 Not Estab. %   Eos 3 Not Estab. %   Basos 0 Not Estab. %   Neutrophils Absolute 4.6 1.4 - 7.0 x10E3/uL   Lymphocytes Absolute 2.0 0.7 - 3.1 x10E3/uL   Monocytes Absolute 0.7 0.1 - 0.9 x10E3/uL   EOS (ABSOLUTE) 0.2 0.0 - 0.4 x10E3/uL   Basophils Absolute 0.0 0.0 - 0.2 x10E3/uL   Immature Granulocytes 1 Not Estab. %   Immature Grans (Abs) 0.0 0.0 - 0.1 S23T5/VU  Basic metabolic panel  Result Value Ref Range   Glucose 181 (H) 70 - 99 mg/dL   BUN 17 8 - 27 mg/dL   Creatinine, Ser 1.30 (H) 0.57 - 1.00 mg/dL   eGFR 45 (L) >59 mL/min/1.73   BUN/Creatinine Ratio 13 12 - 28   Sodium 135 134 - 144 mmol/L   Potassium 3.8 3.5 - 5.2 mmol/L   Chloride 97 96 - 106 mmol/L   CO2 20 20 - 29 mmol/L   Calcium 10.3 8.7 - 10.3 mg/dL      Assessment & Plan:   Problem List Items Addressed This Visit       Endocrine   Hypothyroidism - Primary    Rechecking levels today. Await results. Treat as needed.        Relevant Orders   TSH     Other   History of cerebellar stroke    Has gotten in with neurology. Continue to monitor. Call with any concerns. Continue meclizine as needed.          Follow up plan: Return in about 4 weeks (around 06/30/2021).

## 2021-06-02 NOTE — Assessment & Plan Note (Signed)
Has gotten in with neurology. Continue to monitor. Call with any concerns. Continue meclizine as needed.

## 2021-06-03 ENCOUNTER — Other Ambulatory Visit: Payer: Self-pay | Admitting: Family Medicine

## 2021-06-03 LAB — TSH: TSH: 1 u[IU]/mL (ref 0.450–4.500)

## 2021-06-03 MED ORDER — LEVOTHYROXINE SODIUM 125 MCG PO TABS: ORAL_TABLET | ORAL | 3 refills | Status: DC

## 2021-06-11 ENCOUNTER — Ambulatory Visit: Payer: HMO | Admitting: Diagnostic Neuroimaging

## 2021-06-19 ENCOUNTER — Telehealth: Payer: Self-pay | Admitting: Cardiovascular Disease

## 2021-06-19 NOTE — Telephone Encounter (Signed)
Noted  

## 2021-06-19 NOTE — Telephone Encounter (Signed)
Patient was returning a phone call and wanted to let Dr. Mariah Milling and nurse know that she has not ever seen a cardiologist before.

## 2021-06-23 NOTE — Progress Notes (Signed)
Cardiology Office Note  Date:  06/24/2021   ID:  Christie Bryant, DOB December 14, 1955, MRN [id number]  PCP:  Dorcas Carrow, DO   Chief Complaint  Patient presents with   New Patient (Initial Visit)    Ref by Dr. Marjory Lies to establish care for Cryptogenic stroke. Patient c/o dizziness and racing heartbeats at times, has taken Metoprolol for many years. Medications reviewed by the patient verbally.     HPI:  Christie Bryant is a 66 year old woman with past medical history of Diabetes Hyperlipidemia Hypertension Who presents by referral from Dr. Joycelyn Schmid for cryptogenic stroke  Hospitalized April 2023 with stroke, symptoms of vertigo, nausea blood pressure elevated on arrival 180/80 Had MRI showing: Acute infarct in the inferior left cerebellum. Associated edema without significant mass effect. A few additional punctate acute infarcts in the left parieto-occipital region.   carotid system: Minimal atherosclerotic disease Anterior circulation: Mild atherosclerotic calcification  Seen by neurology in the hospital it was recommended she stay on aspirin Also Plavix for 21 days  Echocardiogram with normal ejection fraction 60%, bubble study negative  Prior hx of paroxysmal tachycardia  EKG personally reviewed by myself on todays visit Normal sinus rhythm rate 75 bpm left anterior fascicular block  Orthostatics negative   PMH:   has a past medical history of Chicken pox, Chronic kidney disease, Diabetes mellitus (2012), Hyperlipidemia, Hypertension, Neuromuscular disorder (HCC), Recurrent boils, Stroke (HCC) (04/24/2021), and Thyroid disease.  PSH:    Past Surgical History:  Procedure Laterality Date   BIOPSY THYROID     normal, Dr. Tedd Sias   BREAST REDUCTION SURGERY     COLONOSCOPY WITH PROPOFOL N/A 08/13/2014   Procedure: COLONOSCOPY WITH PROPOFOL;  Surgeon: Wallace Cullens, MD;  Location: [facility] ENDOSCOPY;  Service: Gastroenterology;  Laterality: N/A;   COLONOSCOPY WITH PROPOFOL  N/A 11/12/2014   Procedure: COLONOSCOPY WITH PROPOFOL;  Surgeon: Wallace Cullens, MD;  Location: [facility] ENDOSCOPY;  Service: Gastroenterology;  Laterality: N/A;   COSMETIC SURGERY     gluteal abscess drainage  03/17/2013   TOTAL ABDOMINAL HYSTERECTOMY W/ BILATERAL SALPINGOOPHORECTOMY  01/13/1996   complete   TUBAL LIGATION      Current Outpatient Medications  Medication Sig Dispense Refill   acetaminophen (TYLENOL) 325 MG tablet Take 2 tablets (650 mg total) by mouth every 6 (six) hours as needed for mild pain (or Fever >/= 101).     aspirin 81 MG chewable tablet Chew 1 tablet (81 mg total) by mouth daily.     atorvastatin (LIPITOR) 80 MG tablet Take 1 tablet (80 mg total) by mouth at bedtime. 90 tablet 0   Cholecalciferol (VITAMIN D-3 PO) Take 2,000 Units by mouth daily.      docusate sodium (COLACE) 100 MG capsule Take 100 mg by mouth daily.      Dulaglutide (TRULICITY) 3 MG/0.5ML SOPN Inject 3 mg as directed once a week. 6 mL 1   Insulin Pen Needle (PEN NEEDLES 3/16") 31G X 5 MM MISC 1 each by Does not apply route once a week. 100 each 12   levothyroxine (SYNTHROID) 125 MCG tablet TAKE 1 TABLET BY MOUTH ONCE DAILY ON  AN  EMPTY  STOMACH  WITH  A  GLASS  OF  WATER  AT  LEAST  30  TO  60  MINUTES  BEFORE  BREAKFAST 90 tablet 3   meclizine (ANTIVERT) 25 MG tablet Take 1 tablet (25 mg total) by mouth 3 (three) times daily as needed for dizziness. 90 tablet 6  metFORMIN (GLUCOPHAGE) 1000 MG tablet Take 1 tablet (1,000 mg total) by mouth 2 (two) times daily with a meal. 180 tablet 1   metoprolol tartrate (LOPRESSOR) 100 MG tablet Take 1 tablet (100 mg total) by mouth 2 (two) times daily. 180 tablet 1   nortriptyline (PAMELOR) 10 MG capsule Take 1 capsule (10 mg total) by mouth at bedtime. 90 capsule 1   omeprazole (PRILOSEC) 20 MG capsule Take 20 mg by mouth daily.     TRESIBA FLEXTOUCH 200 UNIT/ML FlexTouch Pen Inject 0-40 Units into the skin at bedtime. 9 mL 1   triamterene-hydrochlorothiazide  (MAXZIDE) 75-50 MG tablet Take 1/2 (one-half) tablet by mouth once daily 45 tablet 1   No current facility-administered medications for this visit.     Allergies:   Patient has no known allergies.   Social History:  The patient  reports that she has never smoked. She has never used smokeless tobacco. She reports that she does not drink alcohol and does not use drugs.   Family History:   family history includes Cancer in her father and mother; Diabetes in an other family member; Heart disease in her brother, father, and another family member; Hyperlipidemia in her father; Hypertension in her brother, father, maternal grandfather, maternal grandmother, sister, sister, sister, and another family member; Liver disease in her sister; Pancreatic cancer in her maternal grandfather; Stroke in her maternal grandmother, paternal grandfather, and paternal grandmother; Sudden death in an other family member.    Review of Systems: Review of Systems  Constitutional: Negative.   HENT: Negative.    Respiratory: Negative.    Cardiovascular: Negative.   Gastrointestinal: Negative.   Musculoskeletal: Negative.   Neurological:  Positive for dizziness.  Psychiatric/Behavioral: Negative.    All other systems reviewed and are negative.   PHYSICAL EXAM: VS:  BP 120/70 (BP Location: Right Arm, Patient Position: Sitting, Cuff Size: Normal)   Pulse 75   Ht 5\' 10"  (1.778 m)   Wt 212 lb 4 oz (96.3 kg)   SpO2 98%   BMI 30.45 kg/m  , BMI Body mass index is 30.45 kg/m. GEN: Well nourished, well developed, in no acute distress HEENT: normal Neck: no JVD, carotid bruits, or masses Cardiac: RRR; no murmurs, rubs, or gallops,no edema  Respiratory:  clear to auscultation bilaterally, normal work of breathing GI: soft, nontender, nondistended, + BS MS: no deformity or atrophy Skin: warm and dry, no rash Neuro:  Strength and sensation are intact Psych: euthymic mood, full affect   Recent Labs: 04/24/2021:  ALT 22 04/28/2021: Magnesium 2.2 05/05/2021: BUN 17; Creatinine, Ser 1.30; Hemoglobin 15.1; Platelets 240; Potassium 3.8; Sodium 135 06/02/2021: TSH 1.000    Lipid Panel Lab Results  Component Value Date   CHOL 199 11/20/2013   HDL 37.20 (L) 11/20/2013   LDLCALC 132 (H) 11/20/2013   TRIG 151.0 (H) 11/20/2013      Wt Readings from Last 3 Encounters:  06/24/21 212 lb 4 oz (96.3 kg)  06/02/21 214 lb (97.1 kg)  05/20/21 212 lb 6.4 oz (96.3 kg)      ASSESSMENT AND PLAN:  Problem List Items Addressed This Visit       Cardiology Problems   Hypertension (Chronic)   Relevant Orders   EKG 12-Lead   Hyperlipidemia with target low density lipoprotein (LDL) cholesterol less than 70 mg/dL     Other   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)   Other Visit Diagnoses     Cerebrovascular accident (CVA),  unspecified mechanism (HCC)    -  Primary   Relevant Orders   LONG TERM MONITOR (3-14 DAYS)      History of stroke Recent events discussed in detail MRI scan confirming stroke, etiology unclear Mild cerebrovascular disease noted on imaging On aspirin, she has completed Plavix, also on statin Goal LDL less than 70 Zio monitor ordered to rule out arrhythmia such as atrial fibrillation Discussed a monitoring system at home such as Apple Watch versus loop monitor She prefers to look into a Apple Watch at this time for long-term monitoring Discussed transesophageal echo, discussed transthoracic echo performed in the hospital.  She is inclined not to proceed with TEE at this time.  Information concerning the procedure discussed and additional info provided for her review.  Recommended if she would like to have this done after further deliberation, to call our office and this could be arranged  Hyperlipidemia Continue Lipitor 80 with goal LDL less than 70  Type 2 diabetes We have encouraged continued exercise, careful diet management in an effort to lose weight.   A1c trending downward    Total encounter time more than 60 minutes  Greater than 50% was spent in counseling and coordination of care with the patient    Signed, Dossie Arbour, M.D., Ph.D. [facility] [place], [place] [phone]

## 2021-06-24 ENCOUNTER — Encounter: Payer: Self-pay | Admitting: Cardiovascular Disease

## 2021-06-24 ENCOUNTER — Ambulatory Visit (INDEPENDENT_AMBULATORY_CARE_PROVIDER_SITE_OTHER): Payer: HMO

## 2021-06-24 ENCOUNTER — Ambulatory Visit (INDEPENDENT_AMBULATORY_CARE_PROVIDER_SITE_OTHER): Payer: HMO | Admitting: Cardiovascular Disease

## 2021-06-24 VITALS — BP 120/70 | HR 75 | Ht 70.0 in | Wt 212.2 lb

## 2021-06-24 DIAGNOSIS — I639 Cerebral infarction, unspecified: Secondary | ICD-10-CM | POA: Diagnosis not present

## 2021-06-24 DIAGNOSIS — E1142 Type 2 diabetes mellitus with diabetic polyneuropathy: Secondary | ICD-10-CM | POA: Diagnosis not present

## 2021-06-24 DIAGNOSIS — I10 Essential (primary) hypertension: Secondary | ICD-10-CM | POA: Diagnosis not present

## 2021-06-24 DIAGNOSIS — E785 Hyperlipidemia, unspecified: Secondary | ICD-10-CM | POA: Diagnosis not present

## 2021-06-24 NOTE — Patient Instructions (Addendum)
Information on tranesophageal echo Information on Reveal device/loop monitor  Consider a monitoring system such as apple watch   Medication Instructions:  No changes  If you need a refill on your cardiac medications before your next appointment, please call your pharmacy.   Lab work: No new labs needed  Testing/Procedures:  Your provider has ordered a heart monitor to wear for 14 days. This will be mailed to your home with instructions on placement. Once you have finished the time frame requested, you will return monitor in box provided.     Follow-Up: At [facility], you and your health needs are our priority.  As part of our continuing mission to provide you with exceptional heart care, we have created designated Provider Care Teams.  These Care Teams include your primary Cardiologist (physician) and Advanced Practice Providers (APPs -  Physician Assistants and Nurse Practitioners) who all work together to provide you with the care you need, when you need it.  You will need a follow up appointment as needed  Providers on your designated Care Team:   Nicolasa Ducking, NP Eula Listen, PA-C Cadence Fransico Michael, [place]  COVID-19 Vaccine Information can be found at: [email or web address] For questions related to vaccine distribution or appointments, please email vaccine@ [email or web address] or call [phone].

## 2021-07-01 DIAGNOSIS — I639 Cerebral infarction, unspecified: Secondary | ICD-10-CM | POA: Diagnosis not present

## 2021-07-10 NOTE — Telephone Encounter (Deleted)
Pt is calling back requesting an update on this.  Pt stated she is due to take it Sunday so is waiting for an update.   Please advise.

## 2021-07-11 ENCOUNTER — Telehealth: Payer: Self-pay

## 2021-07-11 NOTE — Telephone Encounter (Signed)
Copied from CRM [id number]. Topic: General - Inquiry >> Jul 10, 2021 11:33 AM De Blanch wrote: Reason for CRM:Pt is calling back requesting an update on her Trulicity patient assistance application.   Pt stated she is due to take it Sunday so is waiting for an update.    Please advise.   See other phone encounter in chart.

## 2021-07-14 ENCOUNTER — Telehealth: Payer: Self-pay

## 2021-07-14 NOTE — Progress Notes (Signed)
I have called to check on the status on patients Trulciity it looks like they did not receive the dose change that was signed and completed by the provider so I have refax it to them and expedited the order to the pharmacy so the patient can received her medication soon. I have lvm to the patient about the update on her Trulicity and a call back number if she has any questions as well.  Rance Muir, RMA Health Concierge

## 2021-07-16 ENCOUNTER — Encounter: Payer: Self-pay | Admitting: Family Medicine

## 2021-07-23 ENCOUNTER — Telehealth: Payer: Self-pay

## 2021-07-23 DIAGNOSIS — I639 Cerebral infarction, unspecified: Secondary | ICD-10-CM | POA: Diagnosis not present

## 2021-07-23 NOTE — Telephone Encounter (Signed)
Copied from CRM [id number]. Topic: General - Call Back - No Documentation >> Jul 23, 2021  3:16 PM Dondra Prader E wrote: Reason for CRM: Pt wants to know if she needs to come in to the office and sign documentation for her trulicity. Pt has received a letter stating that her signature is missing and also her PCP's. Please advise  Best contact: [phone]   Routing to pharmacy team. I do not see paperwork here in the office for the patient. Please advise.

## 2021-07-24 ENCOUNTER — Telehealth: Payer: Self-pay | Admitting: Cardiovascular Disease

## 2021-07-24 NOTE — Telephone Encounter (Signed)
Pt calling to f/u on Heart Monitor results. She would like a callback once they are available. Please advise

## 2021-07-25 NOTE — Telephone Encounter (Signed)
Christie Iba, MD     Not available to read yet, we do not have the data in our system  We will call her once it is available  Thx  TGollan    Called patient. No answer. Detailed message left per [facility]

## 2021-07-28 NOTE — Telephone Encounter (Signed)
Pt is calling back trying to get an answer to her issues with documentation re trulicity. FU pls at [phone]

## 2021-07-30 NOTE — Progress Notes (Signed)
I have contacted [facility] about patient being denied for Trulicity application, but the patient has been approved before since 11/2020 until the end of 12/2021 and we were only requesting a increase dosage. [facility] were unable to find the patient in there system due to having a typo and incorrect information on her account until I have given them the PAE number which was a confirmed approval number. They have merged her accounts on there end and will send the requested dose change to the pharmacy and the patient should be receiving the medication from them with in 1 week. I have called and explained everything to the patient, she was very thankful for the help and process.  Rance Muir, RMA Health Concierge

## 2021-08-04 ENCOUNTER — Ambulatory Visit: Payer: HMO | Admitting: Family Medicine

## 2021-08-05 ENCOUNTER — Encounter: Payer: Self-pay | Admitting: Family Medicine

## 2021-08-05 ENCOUNTER — Ambulatory Visit (INDEPENDENT_AMBULATORY_CARE_PROVIDER_SITE_OTHER): Payer: PPO | Admitting: Family Medicine

## 2021-08-05 VITALS — BP 118/75 | HR 76 | Temp 97.9°F | Wt 216.0 lb

## 2021-08-05 DIAGNOSIS — E1142 Type 2 diabetes mellitus with diabetic polyneuropathy: Secondary | ICD-10-CM | POA: Diagnosis not present

## 2021-08-05 DIAGNOSIS — R5383 Other fatigue: Secondary | ICD-10-CM

## 2021-08-05 DIAGNOSIS — M1A9XX0 Chronic gout, unspecified, without tophus (tophi): Secondary | ICD-10-CM | POA: Diagnosis not present

## 2021-08-05 DIAGNOSIS — I10 Essential (primary) hypertension: Secondary | ICD-10-CM | POA: Diagnosis not present

## 2021-08-05 DIAGNOSIS — E039 Hypothyroidism, unspecified: Secondary | ICD-10-CM

## 2021-08-05 LAB — BAYER DCA HB A1C WAIVED: HB A1C (BAYER DCA - WAIVED): 5.9 % — ABNORMAL HIGH (ref 4.8–5.6)

## 2021-08-05 MED ORDER — TRESIBA FLEXTOUCH 200 UNIT/ML ~~LOC~~ SOPN: 60.0000 [IU] | PEN_INJECTOR | Freq: Every evening | SUBCUTANEOUS | 3 refills | Status: DC

## 2021-08-05 MED ORDER — NORTRIPTYLINE HCL 10 MG PO CAPS: 10.0000 mg | ORAL_CAPSULE | Freq: Every day | ORAL | 1 refills | Status: DC

## 2021-08-05 MED ORDER — ATORVASTATIN CALCIUM 80 MG PO TABS: 80.0000 mg | ORAL_TABLET | Freq: Every day | ORAL | 1 refills | Status: DC

## 2021-08-05 NOTE — Assessment & Plan Note (Signed)
Sugars doing great with A1c of 5.9. Has not been able to go up to 3mg  of trulicity, but is continuing on 1.5mg . Will check in with [facility] pharmacy about 3mg . Goal of decreasing insulin as we go up on trulicity. Continue current regimen for now- self titrating based on glucose on insulin. Goal of cutting down when she gets on higher dose of trulicity.

## 2021-08-05 NOTE — Assessment & Plan Note (Signed)
Rechecking labs today. Await results. Treat as needed.  °

## 2021-08-05 NOTE — Progress Notes (Signed)
BP 118/75   Pulse 76   Temp 97.9 F (36.6 C)   Wt 216 lb (98 kg)   SpO2 98%   BMI 30.99 kg/m    Subjective:    Patient ID: Christie Bryant, female    DOB: 1955-09-22, 66 y.o.   MRN: [id number]  HPI: Christie Bryant is a 66 y.o. female  Chief Complaint  Patient presents with   Diabetes   Hypothyroidism   History of cerebellar stroke   Fatigue    Patient states she has been feeling extremely tired lately.    FATIGUE Duration:  3+ Months Severity: severe  Onset: sudden Context when symptoms started:  stoke Symptoms improve with rest: no  Depressive symptoms: no Stress/anxiety: no Insomnia: no  Snoring: unknown Observed apnea by bed partner: no Daytime hypersomnolence:yes Wakes feeling refreshed: no History of sleep study: no Dysnea on exertion:  no Orthopnea/PND: no Chest pain: no Chronic cough: no Lower extremity edema: no Arthralgias:no Myalgias: no Weakness: no Rash: no  DIABETES Hypoglycemic episodes:no Polydipsia/polyuria: no Visual disturbance: no Chest pain: no Paresthesias: no Glucose Monitoring: yes  Accucheck frequency: BID Taking Insulin?: yes Blood Pressure Monitoring: not checking Retinal Examination: Up to Date Foot Exam: Up to Date Diabetic Education: Completed Pneumovax: Up to Date Influenza: Up to Date Aspirin: no  HYPOTHYROIDISM Thyroid control status:controlled Satisfied with current treatment? yes Medication side effects: no Recent dose adjustment:no Fatigue: yes Cold intolerance: no Heat intolerance: no Weight gain: no Weight loss: no Constipation: yes Diarrhea/loose stools: yes Palpitations: no Lower extremity edema: no Anxiety/depressed mood: no   Relevant past medical, surgical, family and social history reviewed and updated as indicated. Interim medical history since our last visit reviewed. Allergies and medications reviewed and updated.  Review of Systems  Constitutional:  Positive for fatigue. Negative for  activity change, appetite change, chills, diaphoresis, fever and unexpected weight change.  Respiratory: Negative.    Cardiovascular: Negative.   Gastrointestinal: Negative.   Musculoskeletal: Negative.   Psychiatric/Behavioral: Negative.      Per HPI unless specifically indicated above     Objective:    BP 118/75   Pulse 76   Temp 97.9 F (36.6 C)   Wt 216 lb (98 kg)   SpO2 98%   BMI 30.99 kg/m   Wt Readings from Last 3 Encounters:  08/05/21 216 lb (98 kg)  06/24/21 212 lb 4 oz (96.3 kg)  06/02/21 214 lb (97.1 kg)    Physical Exam Vitals and nursing note reviewed.  Constitutional:      General: She is not in acute distress.    Appearance: Normal appearance. She is obese. She is not ill-appearing, toxic-appearing or diaphoretic.  HENT:     Head: Normocephalic and atraumatic.     Right Ear: External ear normal.     Left Ear: External ear normal.     Nose: Nose normal.     Mouth/Throat:     Mouth: Mucous membranes are moist.     Pharynx: Oropharynx is clear.  Eyes:     General: No scleral icterus.       Right eye: No discharge.        Left eye: No discharge.     Extraocular Movements: Extraocular movements intact.     Conjunctiva/sclera: Conjunctivae normal.     Pupils: Pupils are equal, round, and reactive to light.  Cardiovascular:     Rate and Rhythm: Normal rate and regular rhythm.     Pulses: Normal pulses.  Heart sounds: Normal heart sounds. No murmur heard.    No friction rub. No gallop.  Pulmonary:     Effort: Pulmonary effort is normal. No respiratory distress.     Breath sounds: Normal breath sounds. No stridor. No wheezing, rhonchi or rales.  Chest:     Chest wall: No tenderness.  Musculoskeletal:        General: Normal range of motion.     Cervical back: Normal range of motion and neck supple.  Skin:    General: Skin is warm and dry.     Capillary Refill: Capillary refill takes less than 2 seconds.     Coloration: Skin is not jaundiced or  pale.     Findings: No bruising, erythema, lesion or rash.  Neurological:     General: No focal deficit present.     Mental Status: She is alert and oriented to person, place, and time. Mental status is at baseline.  Psychiatric:        Mood and Affect: Mood normal.        Behavior: Behavior normal.        Thought Content: Thought content normal.        Judgment: Judgment normal.     Results for orders placed or performed in visit on 08/05/21  Bayer DCA Hb A1c Waived  Result Value Ref Range   HB A1C (BAYER DCA - WAIVED) 5.9 (H) 4.8 - 5.6 %      Assessment & Plan:   Problem List Items Addressed This Visit       Cardiovascular and Mediastinum   Hypertension (Chronic)    Under good control on current regimen. Continue current regimen. Continue to monitor. Call with any concerns. Refills given. Labs drawn today.        Relevant Medications   atorvastatin (LIPITOR) 80 MG tablet   Other Relevant Orders   Comprehensive metabolic panel   CBC with Differential/Platelet     Endocrine   DM type 2 with diabetic peripheral neuropathy (HCC) - Primary (Chronic)    Sugars doing great with A1c of 5.9. Has not been able to go up to 3mg  of trulicity, but is continuing on 1.5mg . Will check in with [facility] pharmacy about 3mg . Goal of decreasing insulin as we go up on trulicity. Continue current regimen for now- self titrating based on glucose on insulin. Goal of cutting down when she gets on higher dose of trulicity.      Relevant Medications   TRESIBA FLEXTOUCH 200 UNIT/ML FlexTouch Pen   nortriptyline (PAMELOR) 10 MG capsule   atorvastatin (LIPITOR) 80 MG tablet   Other Relevant Orders   Bayer DCA Hb A1c Waived (Completed)   Comprehensive metabolic panel   CBC with Differential/Platelet   Hypothyroidism    Rechecking labs today. Await results. Treat as needed.       Relevant Orders   Comprehensive metabolic panel   CBC with Differential/Platelet   TSH   Neuropathy in diabetes (HCC)     Rechecking labs today. Await results. Treat as needed.       Relevant Medications   TRESIBA FLEXTOUCH 200 UNIT/ML FlexTouch Pen   atorvastatin (LIPITOR) 80 MG tablet   Other Relevant Orders   Comprehensive metabolic panel   CBC with Differential/Platelet   VITAMIN D 25 Hydroxy (Vit-D Deficiency, Fractures)   B12   Other Visit Diagnoses     Other fatigue       Will check labs. If normal will arrange for sleep study. Call  with any concerns. Await results.         Follow up plan: Return in about 3 months (around 11/05/2021).

## 2021-08-05 NOTE — Assessment & Plan Note (Signed)
Under good control on current regimen. Continue current regimen. Continue to monitor. Call with any concerns. Refills given. Labs drawn today.   

## 2021-08-06 LAB — COMPREHENSIVE METABOLIC PANEL
ALT: 93 IU/L — ABNORMAL HIGH (ref 0–32)
AST: 83 IU/L — ABNORMAL HIGH (ref 0–40)
Albumin/Globulin Ratio: 1.4 (ref 1.2–2.2)
Albumin: 4 g/dL (ref 3.9–4.9)
Alkaline Phosphatase: 109 IU/L (ref 44–121)
BUN/Creatinine Ratio: 14 (ref 12–28)
BUN: 16 mg/dL (ref 8–27)
Bilirubin Total: 1.1 mg/dL (ref 0.0–1.2)
CO2: 24 mmol/L (ref 20–29)
Calcium: 10.2 mg/dL (ref 8.7–10.3)
Chloride: 102 mmol/L (ref 96–106)
Creatinine, Ser: 1.12 mg/dL — ABNORMAL HIGH (ref 0.57–1.00)
Globulin, Total: 2.8 g/dL (ref 1.5–4.5)
Glucose: 177 mg/dL — ABNORMAL HIGH (ref 70–99)
Potassium: 4.7 mmol/L (ref 3.5–5.2)
Sodium: 139 mmol/L (ref 134–144)
Total Protein: 6.8 g/dL (ref 6.0–8.5)
eGFR: 54 mL/min/{1.73_m2} — ABNORMAL LOW (ref >59)

## 2021-08-06 LAB — CBC WITH DIFFERENTIAL/PLATELET
Basophils Absolute: 0 10*3/uL (ref 0.0–0.2)
Basos: 0 %
EOS (ABSOLUTE): 0.3 10*3/uL (ref 0.0–0.4)
Eos: 5 %
Hematocrit: 38.4 % (ref 34.0–46.6)
Hemoglobin: 12.8 g/dL (ref 11.1–15.9)
Immature Grans (Abs): 0 10*3/uL (ref 0.0–0.1)
Immature Granulocytes: 1 %
Lymphocytes Absolute: 1.7 10*3/uL (ref 0.7–3.1)
Lymphs: 26 %
MCH: 33.1 pg — ABNORMAL HIGH (ref 26.6–33.0)
MCHC: 33.3 g/dL (ref 31.5–35.7)
MCV: 99 fL — ABNORMAL HIGH (ref 79–97)
Monocytes Absolute: 0.6 10*3/uL (ref 0.1–0.9)
Monocytes: 9 %
Neutrophils Absolute: 3.7 10*3/uL (ref 1.4–7.0)
Neutrophils: 59 %
Platelets: 145 10*3/uL — ABNORMAL LOW (ref 150–450)
RBC: 3.87 x10E6/uL (ref 3.77–5.28)
RDW: 12.7 % (ref 11.7–15.4)
WBC: 6.4 10*3/uL (ref 3.4–10.8)

## 2021-08-06 LAB — TSH: TSH: 1.39 u[IU]/mL (ref 0.450–4.500)

## 2021-08-06 LAB — VITAMIN B12: Vitamin B-12: 390 pg/mL (ref 232–1245)

## 2021-08-06 LAB — VITAMIN D 25 HYDROXY (VIT D DEFICIENCY, FRACTURES): Vit D, 25-Hydroxy: 61.3 ng/mL (ref 30.0–100.0)

## 2021-08-06 MED ORDER — VITAMIN B-12 1000 MCG PO TABS: 1000.0000 ug | ORAL_TABLET | Freq: Every day | ORAL | 3 refills | Status: DC

## 2021-08-06 NOTE — Addendum Note (Signed)
Addended by: Dorcas Carrow on: 08/06/2021 10:47 AM   Modules accepted: Orders

## 2021-08-08 ENCOUNTER — Telehealth: Payer: Self-pay

## 2021-08-08 NOTE — Chronic Care Management (AMB) (Signed)
I have reached out to [facility] to check the status on the status of patient Trulicity delivery it looks like the pharmacy section of [facility] never received the information that was given to them when I spoke to the representative on 07/30/21. I spoke to the pharmacy directly the patients medication will be delivered out within one week and I confirmed with them and they said it will be delivered out,  Rance Muir, RMA Health Concierge

## 2021-08-11 ENCOUNTER — Encounter: Payer: Self-pay | Admitting: Family Medicine

## 2021-08-12 ENCOUNTER — Encounter: Payer: Self-pay | Admitting: Family Medicine

## 2021-09-04 ENCOUNTER — Encounter: Payer: Self-pay | Admitting: Family Medicine

## 2021-09-05 ENCOUNTER — Other Ambulatory Visit: Payer: Self-pay

## 2021-09-05 DIAGNOSIS — E1142 Type 2 diabetes mellitus with diabetic polyneuropathy: Secondary | ICD-10-CM

## 2021-09-05 MED ORDER — ONETOUCH ULTRA VI STRP: ORAL_STRIP | 12 refills | Status: DC

## 2021-09-05 NOTE — Telephone Encounter (Signed)
Patient requesting test strips via MyChart

## 2021-09-30 ENCOUNTER — Encounter: Payer: Self-pay | Admitting: Family Medicine

## 2021-10-15 ENCOUNTER — Telehealth: Payer: HMO

## 2021-10-29 ENCOUNTER — Other Ambulatory Visit: Payer: Self-pay | Admitting: Family Medicine

## 2021-10-29 NOTE — Telephone Encounter (Signed)
Requested Prescriptions  Pending Prescriptions Disp Refills  . metoprolol tartrate (LOPRESSOR) 100 MG tablet [Pharmacy Med Name: Metoprolol Tartrate 100 MG Oral Tablet] 180 tablet 0    Sig: Take 1 tablet by mouth twice daily     Cardiovascular:  Beta Blockers Passed - 10/29/2021  3:15 PM      Passed - Last BP in normal range    BP Readings from Last 1 Encounters:  08/05/21 118/75         Passed - Last Heart Rate in normal range    Pulse Readings from Last 1 Encounters:  08/05/21 76         Passed - Valid encounter within last 6 months    Recent Outpatient Visits          2 months ago DM type 2 with diabetic peripheral neuropathy ([place])   [place], Megan P, DO   4 months ago Hypothyroidism, unspecified type   [facility], Megan P, DO   5 months ago History of cerebellar stroke   [employer] [facility], Megan P, DO   8 months ago DM type 2 with diabetic peripheral neuropathy ([place])   [place], Megan P, DO   1 year ago DM type 2 with diabetic peripheral neuropathy [facility])   [place], [place], DO      Future Appointments            In 6 days Wynetta Emery, Barb Merino, DO [employer]

## 2021-11-04 ENCOUNTER — Ambulatory Visit: Payer: HMO | Admitting: Family Medicine

## 2021-11-06 ENCOUNTER — Ambulatory Visit: Payer: PPO | Admitting: Family Medicine

## 2021-11-10 ENCOUNTER — Encounter (INDEPENDENT_AMBULATORY_CARE_PROVIDER_SITE_OTHER): Payer: Self-pay

## 2021-11-12 LAB — HM MAMMOGRAPHY

## 2021-11-14 ENCOUNTER — Encounter: Payer: Self-pay | Admitting: Family Medicine

## 2021-11-14 ENCOUNTER — Ambulatory Visit (INDEPENDENT_AMBULATORY_CARE_PROVIDER_SITE_OTHER): Payer: HMO | Admitting: Family Medicine

## 2021-11-14 VITALS — BP 100/64 | HR 75 | Temp 97.7°F | Wt 205.9 lb

## 2021-11-14 DIAGNOSIS — E1142 Type 2 diabetes mellitus with diabetic polyneuropathy: Secondary | ICD-10-CM | POA: Diagnosis not present

## 2021-11-14 DIAGNOSIS — Z23 Encounter for immunization: Secondary | ICD-10-CM | POA: Diagnosis not present

## 2021-11-14 LAB — MICROALBUMIN, URINE WAIVED
Creatinine, Urine Waived: 200 mg/dL (ref 10–300)
Microalb, Ur Waived: 150 mg/L — ABNORMAL HIGH (ref 0–19)

## 2021-11-14 LAB — BAYER DCA HB A1C WAIVED: HB A1C (BAYER DCA - WAIVED): 6.6 % — ABNORMAL HIGH (ref 4.8–5.6)

## 2021-11-14 MED ORDER — TRIAMTERENE-HCTZ 75-50 MG PO TABS: ORAL_TABLET | ORAL | 1 refills | Status: DC

## 2021-11-14 MED ORDER — METFORMIN HCL 1000 MG PO TABS: 1000.0000 mg | ORAL_TABLET | Freq: Two times a day (BID) | ORAL | 1 refills | Status: DC

## 2021-11-14 MED ORDER — ONETOUCH ULTRA VI STRP: 1.0000 | ORAL_STRIP | 12 refills | Status: AC | PRN

## 2021-11-14 NOTE — Assessment & Plan Note (Signed)
Doing well with A1c of 6.6. Has been able to cut down on insulin. Continue titration off and continue trulicity 1.5. Recheck 3 months at physical. Call with any concerns.

## 2021-11-14 NOTE — Progress Notes (Signed)
BP 100/64   Pulse 75   Temp 97.7 F (36.5 C)   Wt 205 lb 14.4 oz (93.4 kg)   SpO2 97%   BMI 29.54 kg/m    Subjective:    Patient ID: Christie Bryant, female    DOB: [birth year], 66 y.o.   MRN: [id number]  HPI: Christie Bryant is a 66 y.o. female  Chief Complaint  Patient presents with   Diabetes   DIABETES- up to the 3mg  on the trulicitiy Hypoglycemic episodes:yes Polydipsia/polyuria: no Visual disturbance: no Chest pain: no Paresthesias: no Glucose Monitoring: yes  Accucheck frequency: 3-4x a day Taking Insulin?: yes- has been taking insulin just as needed Blood Pressure Monitoring: not checking Retinal Examination: Up to Date Foot Exam: Up to Date Diabetic Education: Completed Pneumovax: Up to Date Influenza: Up to Date Aspirin: no  Relevant past medical, surgical, family and social history reviewed and updated as indicated. Interim medical history since our last visit reviewed. Allergies and medications reviewed and updated.  Review of Systems  Constitutional: Negative.   Respiratory: Negative.    Cardiovascular: Negative.   Gastrointestinal: Negative.   Musculoskeletal:  Positive for arthralgias (R 3rd toe). Negative for back pain, gait problem, joint swelling, myalgias, neck pain and neck stiffness.  Skin: Negative.   Psychiatric/Behavioral: Negative.      Per HPI unless specifically indicated above     Objective:    BP 100/64   Pulse 75   Temp 97.7 F (36.5 C)   Wt 205 lb 14.4 oz (93.4 kg)   SpO2 97%   BMI 29.54 kg/m   Wt Readings from Last 3 Encounters:  11/14/21 205 lb 14.4 oz (93.4 kg)  08/05/21 216 lb (98 kg)  06/24/21 212 lb 4 oz (96.3 kg)    Physical Exam Vitals and nursing note reviewed.  Constitutional:      General: She is not in acute distress.    Appearance: Normal appearance. She is not ill-appearing, toxic-appearing or diaphoretic.  HENT:     Head: Normocephalic and atraumatic.     Right Ear: External ear normal.     Left Ear:  External ear normal.     Nose: Nose normal.     Mouth/Throat:     Mouth: Mucous membranes are moist.     Pharynx: Oropharynx is clear.  Eyes:     General: No scleral icterus.       Right eye: No discharge.        Left eye: No discharge.     Extraocular Movements: Extraocular movements intact.     Conjunctiva/sclera: Conjunctivae normal.     Pupils: Pupils are equal, round, and reactive to light.  Cardiovascular:     Rate and Rhythm: Normal rate and regular rhythm.     Pulses: Normal pulses.     Heart sounds: Normal heart sounds. No murmur heard.    No friction rub. No gallop.  Pulmonary:     Effort: Pulmonary effort is normal. No respiratory distress.     Breath sounds: Normal breath sounds. No stridor. No wheezing, rhonchi or rales.  Chest:     Chest wall: No tenderness.  Musculoskeletal:        General: Normal range of motion.     Cervical back: Normal range of motion and neck supple.  Skin:    General: Skin is warm and dry.     Capillary Refill: Capillary refill takes less than 2 seconds.     Coloration: Skin is not jaundiced  or pale.     Findings: No bruising, erythema, lesion or rash.  Neurological:     General: No focal deficit present.     Mental Status: She is alert and oriented to person, place, and time. Mental status is at baseline.  Psychiatric:        Mood and Affect: Mood normal.        Behavior: Behavior normal.        Thought Content: Thought content normal.        Judgment: Judgment normal.     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 Has been able to cut down on insulin. Continue titration off and continue trulicity 1.5. Recheck 3 months at physical. Call with any concerns.       Relevant Medications   glucose blood (ONETOUCH ULTRA) test strip   metFORMIN (GLUCOPHAGE) 1000 MG tablet   Neuropathy in diabetes ([place]) - Primary   Relevant Medications   metFORMIN (GLUCOPHAGE) 1000 MG tablet   Other Relevant Orders   Bayer DCA Hb A1c Waived (Completed)   Microalbumin, Urine Waived (Completed)     Follow up plan: Return in about 3 months (around 02/14/2022) for physical.

## 2021-11-24 ENCOUNTER — Telehealth: Payer: HMO

## 2021-11-24 ENCOUNTER — Telehealth: Payer: Self-pay

## 2021-11-24 NOTE — Telephone Encounter (Signed)
Patient called back, rescheduled for next year

## 2021-11-24 NOTE — Telephone Encounter (Signed)
  Care Management   Follow Up Note   11/24/2021 Name: Christie Bryant MRN: [id number] DOB: Jun 01, 1955   Referred by: Dorcas Carrow, DO Reason for referral : Chronic Care Management   An unsuccessful telephone outreach was attempted today. The patient was referred to the case management team for assistance with care management and care coordination.   Follow Up Plan: The patient has been provided with contact information for the care management team and has been advised to call with any health related questions or concerns.  -Called both numbers and left VM with each   Artelia Laroche, Pharm.D. - [phone]

## 2021-12-15 ENCOUNTER — Telehealth: Payer: Self-pay | Admitting: Family Medicine

## 2021-12-15 NOTE — Telephone Encounter (Signed)
Copied from CRM [id number]. Topic: Medicare AWV >> Dec 15, 2021  3:33 PM Granville Lewis wrote: Reason for CRM: Left message for patient to call back and schedule the Medicare Annual Wellness Visit (AWV) virtually or by telephone.  Last AWV 12/03/20  Please schedule at anytime with [facility]-Nurse Health Advisor.  30 minute appointment  Any questions, please call me at [phone]

## 2021-12-16 NOTE — Telephone Encounter (Signed)
Pt is returning call to schedule AWV.   Pt requesting call back.

## 2021-12-17 NOTE — Telephone Encounter (Signed)
LVM letting her know that I have her scheduled for her AWV on 12/29/2021 at 12:30 pm. If this appointment does not work for her please give our office a call back

## 2022-01-06 ENCOUNTER — Other Ambulatory Visit: Payer: Self-pay | Admitting: Family Medicine

## 2022-02-04 ENCOUNTER — Other Ambulatory Visit: Payer: Self-pay | Admitting: Family Medicine

## 2022-02-05 NOTE — Telephone Encounter (Signed)
Requested medication (s) are due for refill today: yes  Requested medication (s) are on the active medication list: yes  Last refill:  08/05/21 #90/1  Future visit scheduled: yes  Notes to clinic:  Unable to refill per protocol due to failed labs, no updated results.    Requested Prescriptions  Pending Prescriptions Disp Refills   atorvastatin (LIPITOR) 80 MG tablet [Pharmacy Med Name: Atorvastatin Calcium 80 MG Oral Tablet] 90 tablet 0    Sig: TAKE 1 TABLET BY MOUTH AT BEDTIME     Cardiovascular:  Antilipid - Statins Failed - 02/04/2022  9:50 PM      Failed - Lipid Panel in normal range within the last 12 months    Cholesterol  Date Value Ref Range Status  11/20/2013 199 0 - 200 mg/dL Final    Comment:    ATP III Classification       Desirable:  < 200 mg/dL               Borderline High:  200 - 239 mg/dL          High:  > = 240 mg/dL   LDL Cholesterol  Date Value Ref Range Status  11/20/2013 132 (H) 0 - 99 mg/dL Final   Direct LDL  Date Value Ref Range Status  04/24/2021 104.5 (H) 0 - 99 mg/dL Final    Comment:    Performed at [facility], [place] [address], [place], [place] [address]   HDL  Date Value Ref Range Status  11/20/2013 37.20 (L) >39.00 mg/dL Final   Triglycerides  Date Value Ref Range Status  11/20/2013 151.0 (H) 0.0 - 149.0 mg/dL Final    Comment:    Normal:  <150 mg/dLBorderline High:  150 - 199 mg/dL         Passed - Patient is not pregnant      Passed - Valid encounter within last 12 months    Recent Outpatient Visits           2 months ago Diabetic polyneuropathy associated with type 2 diabetes mellitus ([place])   [place], Megan P, DO   6 months ago DM type 2 with diabetic peripheral neuropathy [facility])   [place], Megan P, DO   8 months ago Hypothyroidism, unspecified type   [place], Megan P, DO   9 months ago History of cerebellar  stroke   [place], [place], DO   1 year ago DM type 2 with diabetic peripheral neuropathy [facility])   [place], Megan P, DO       Future Appointments             In 2 weeks Wynetta Emery, Barb Merino, DO [place], [facility]

## 2022-02-16 ENCOUNTER — Ambulatory Visit (INDEPENDENT_AMBULATORY_CARE_PROVIDER_SITE_OTHER): Payer: PPO

## 2022-02-16 VITALS — Ht 70.0 in | Wt 205.0 lb

## 2022-02-16 DIAGNOSIS — Z0000 Encounter for general adult medical examination without abnormal findings: Secondary | ICD-10-CM | POA: Diagnosis not present

## 2022-02-16 NOTE — Progress Notes (Signed)
Virtual Visit via Telephone Note  I connected with  Christie Bryant on 02/16/22 at  3:15 PM EST by telephone and verified that I am speaking with the correct person using two identifiers.  Location: Patient: home Provider: [facility] Persons participating in the virtual visit: patient/Nurse Health Advisor   I discussed the limitations, risks, security and privacy concerns of performing an evaluation and management service by telephone and the availability of in person appointments. The patient expressed understanding and agreed to proceed.  Interactive audio and video telecommunications were attempted between this nurse and patient, however failed, due to patient having technical difficulties OR patient did not have access to video capability.  We continued and completed visit with audio only.  Some vital signs may be absent or patient reported.   Hal Hope, LPN  Subjective:   Christie Bryant is a 67 y.o. female who presents for Medicare Annual (Subsequent) preventive examination.  Review of Systems     Cardiac Risk Factors include: advanced age (>58men, >36 women);diabetes mellitus;hypertension;sedentary lifestyle     Objective:    There were no vitals filed for this visit. There is no height or weight on file to calculate BMI.     02/16/2022    3:21 PM 12/03/2020   12:00 PM 10/09/2019    3:44 PM 11/12/2014    7:11 AM 08/13/2014    6:59 AM  Advanced Directives  Does Patient Have a Medical Advance Directive? No No No No No  Would patient like information on creating a medical advance directive? No - Patient declined No - Patient declined No - Patient declined      Current Medications (verified) Outpatient Encounter Medications as of 02/16/2022  Medication Sig   acetaminophen (TYLENOL) 325 MG tablet Take 2 tablets (650 mg total) by mouth every 6 (six) hours as needed for mild pain (or Fever >/= 101).   aspirin 81 MG chewable tablet Chew 1 tablet (81 mg total) by mouth daily.    atorvastatin (LIPITOR) 80 MG tablet TAKE 1 TABLET BY MOUTH AT BEDTIME   Cholecalciferol (VITAMIN D-3 PO) Take 2,000 Units by mouth daily.    Dulaglutide (TRULICITY) 3 MG/0.5ML SOPN Inject 3 mg as directed once a week.   glucose blood (ONETOUCH ULTRA) test strip 1 each by Other route as needed for other. Testing 3-4x a day   Insulin Pen Needle (PEN NEEDLES 3/16") 31G X 5 MM MISC 1 each by Does not apply route once a week.   levothyroxine (SYNTHROID) 125 MCG tablet TAKE 1 TABLET BY MOUTH ONCE DAILY ON  AN  EMPTY  STOMACH  WITH  A  GLASS  OF  WATER  AT  LEAST  30  TO  60  MINUTES  BEFORE  BREAKFAST   meclizine (ANTIVERT) 25 MG tablet Take 1 tablet (25 mg total) by mouth 3 (three) times daily as needed for dizziness.   metFORMIN (GLUCOPHAGE) 1000 MG tablet Take 1 tablet (1,000 mg total) by mouth 2 (two) times daily with a meal.   metoprolol tartrate (LOPRESSOR) 100 MG tablet Take 1 tablet by mouth twice daily   nortriptyline (PAMELOR) 10 MG capsule Take 1 capsule (10 mg total) by mouth at bedtime.   omeprazole (PRILOSEC) 20 MG capsule Take 20 mg by mouth daily.   triamterene-hydrochlorothiazide (MAXZIDE) 75-50 MG tablet Take 1/2 (one-half) tablet by mouth once daily   vitamin B-12 (CYANOCOBALAMIN) 1000 MCG tablet Take 1 tablet (1,000 mcg total) by mouth daily.   Multiple Vitamin (MULTI-VITAMIN  DAILY PO) Take by mouth. (Patient not taking: Reported on 02/16/2022)   No facility-administered encounter medications on file as of 02/16/2022.    Allergies (verified) Patient has no known allergies.   History: Past Medical History:  Diagnosis Date   Chicken pox    Chronic kidney disease    Stones   Diabetes mellitus 2012   Dr. Tedd Sias   Hyperlipidemia    Hypertension    Neuromuscular disorder (HCC)    Recurrent boils    Stroke (HCC) 04/24/2021   Thyroid disease    Past Surgical History:  Procedure Laterality Date   BIOPSY THYROID     normal, Dr. Tedd Sias   BREAST REDUCTION SURGERY     COLONOSCOPY  WITH PROPOFOL N/A 08/13/2014   Procedure: COLONOSCOPY WITH PROPOFOL;  Surgeon: Wallace Cullens, MD;  Location[facility] END[facility]COPY;  Service: Gastroenterology;  Laterality: N/A;   COLONOSCOPY WITH PROPOFOL N/A 11/12/2014   Procedure: COLONOSCOPY WITH PROPOFOL;  Surgeon: Wallace Cullens, MD;  Location: [facility] ENDOSCOPY;  Service: Gastroenterology;  Laterality: N/A;   COSMETIC SURGERY     gluteal abscess drainage  03/17/2013   TOTAL ABDOMINAL HYSTERECTOMY W/ BILATERAL SALPINGOOPHORECTOMY  01/13/1996   complete   TUBAL LIGATION     Family History  Problem Relation Age of Onset   Cancer Mother        ovarian/uterus   Hyperlipidemia Father    Heart disease Father    Hypertension Father    Cancer Father        throat   Hypertension Sister    Hypertension Sister    Hypertension Sister    Liver disease Sister    Heart disease Brother    Hypertension Brother    Stroke Maternal Grandmother    Hypertension Maternal Grandmother    Hypertension Maternal Grandfather    Pancreatic cancer Maternal Grandfather    Stroke Paternal Grandmother    Stroke Paternal Grandfather    Hypertension Other    Heart disease Other    Sudden death Other    Diabetes Other    Social History   Socioeconomic History   Marital status: Married    Spouse name: Charles   Number of children: 2   Years of education: Not on file   Highest education level: Some college, no degree  Occupational History   Not on file  Tobacco Use   Smoking status: Never   Smokeless tobacco: Never  Vaping Use   Vaping Use: Never used  Substance and Sexual Activity   Alcohol use: No   Drug use: No   Sexual activity: Not Currently  Other Topics Concern   Not on file  Social History Narrative   Lives in [place] with husband, from [place].    Work - [employer]   Social Determinants of Health   Financial Resource Strain: Low Risk  (02/16/2022)   Overall Financial Resource Strain (CARDIA)    Difficulty of Paying Living Expenses: Not  hard at all  Food Insecurity: No Food Insecurity (02/16/2022)   Hunger Vital Sign    Worried About Running Out of Food in the Last Year: Never true    Ran Out of Food in the Last Year: Never true  Transportation Needs: No Transportation Needs (02/16/2022)   PRAPARE - Administrator, Civil Service (Medical): No    Lack of Transportation (Non-Medical): No  Physical Activity: Inactive (02/16/2022)   Exercise Vital Sign    Days of Exercise per Week: 0 days  Minutes of Exercise per Session: 0 min  Stress: No Stress Concern Present (02/16/2022)   [place]    Feeling of Stress : Only a little  Social Connections: Socially Integrated (02/16/2022)   Social Connection and Isolation Panel [NHANES]    Frequency of Communication with Friends and Family: More than three times a week    Frequency of Social Gatherings with Friends and Family: Once a week    Attends Religious Services: More than 4 times per year    Active Member of [employer] or Organizations: Yes    Attends Music therapist: More than 4 times per year    Marital Status: Married    Tobacco Counseling Counseling given: Not Answered   Clinical Intake:  Pre-visit preparation completed: Yes  Pain : No/denies pain     Diabetes: Yes CBG done?: No Did pt. bring in CBG monitor from home?: No  How often do you need to have someone help you when you read instructions, pamphlets, or other written materials from your doctor or pharmacy?: 1 - Never  Diabetic?yes Nutrition Risk Assessment:  Has the patient had any N/V/D within the last 2 months?  No  Does the patient have any non-healing wounds?  No  Has the patient had any unintentional weight loss or weight gain?  No   Diabetes:  Is the patient diabetic?  Yes  If diabetic, was a CBG obtained today?  No  Did the patient bring in their glucometer from home?  No  How often do you monitor your  CBG's? Twice/day.   Financial Strains and Diabetes Management:  Are you having any financial strains with the device, your supplies or your medication? No .  Does the patient want to be seen by Chronic Care Management for management of their diabetes?  No  Would the patient like to be referred to a Nutritionist or for Diabetic Management?  No - wants to talk to MD about going to an endocrinologist  Diabetic Exams:  Diabetic Eye Exam: Completed 02/13/21, has appt next month. . Pt has been advised about the importance in completing this exam.  Diabetic Foot Exam: Completed 11/14/21. Pt has been advised about the importance in completing this exam. Pt is scheduled for    Interpreter Needed?: No  Information entered by :: Kirke Shaggy, LPN   Activities of Daily Living    02/16/2022    3:22 PM  In your present state of health, do you have any difficulty performing the following activities:  Hearing? 0  Vision? 0  Difficulty concentrating or making decisions? 0  Walking or climbing stairs? 0  Dressing or bathing? 0  Doing errands, shopping? 0  Preparing Food and eating ? N  Using the Toilet? N  In the past six months, have you accidently leaked urine? N  Do you have problems with loss of bowel control? N  Managing your Medications? N  Managing your Finances? N  Housekeeping or managing your Housekeeping? N    Patient Care Team: Valerie Roys, DO as PCP - General (Family Medicine) Christene Lye, MD (General Surgery) Jackolyn Confer, MD (Internal Medicine) Gabriel Carina Betsey Holiday, MD as Physician Assistant (Endocrinology)  Indicate any recent Medical Services you may have received from other than [facility] providers in the past year (date may be approximate).     Assessment:   This is a routine wellness examination for [employer].  Hearing/Vision screen Hearing Screening - Comments:: No  aids Vision Screening - Comments:: Wears glasses- Dr.King  Dietary issues and exercise  activities discussed: Current Exercise Habits: The patient does not participate in regular exercise at present   Goals Addressed             This Visit's Progress    DIET - EAT MORE FRUITS AND VEGETABLES         Depression Screen    02/16/2022    3:20 PM 11/14/2021    3:09 PM 08/05/2021    2:03 PM 06/02/2021    1:47 PM 05/05/2021    1:12 PM 02/04/2021    1:28 PM 12/03/2020   12:19 PM  PHQ 2/9 Scores  PHQ - 2 Score 0 3 0 0 0 0 0  PHQ- 9 Score 0 9 3 1  0 2     Fall Risk    02/16/2022    3:22 PM 05/20/2021   11:19 AM 05/05/2021    1:12 PM 12/03/2020   12:21 PM 08/21/2020   11:12 AM  [place] in the past year? 0 0 1 0 0  Number falls in past yr: 0  0 0 0  Injury with Fall? 0  0 0 0  Risk for fall due to : No Fall Risks  History of fall(s)  No Fall Risks  Follow up Falls prevention discussed;Falls evaluation completed  Falls evaluation completed Falls prevention discussed;Falls evaluation completed Falls evaluation completed    FALL RISK PREVENTION PERTAINING TO THE HOME:  Any stairs in or around the home? Yes  If so, are there any without handrails? No  Home free of loose throw rugs in walkways, pet beds, electrical cords, etc? Yes  Adequate lighting in your home to reduce risk of falls? Yes   ASSISTIVE DEVICES UTILIZED TO PREVENT FALLS:  Life alert? No  Use of a cane, walker or w/c? No  Grab bars in the bathroom? Yes  Shower chair or bench in shower? Yes  Elevated toilet seat or a handicapped toilet? Yes    Cognitive Function:        02/16/2022    3:29 PM  6CIT Screen  What Year? 0 points  What month? 0 points  What time? 0 points  Count back from 20 0 points  Months in reverse 0 points  Repeat phrase 0 points  Total Score 0 points    Immunizations Immunization History  Administered Date(s) Administered   Fluad Quad(high Dose 65+) 09/30/2020   Influenza, High Dose Seasonal PF 10/24/2021   Influenza-Unspecified 10/26/2012, 10/26/2013, 09/13/2014    PFIZER Comirnaty(Gray Top)Covid-19 Tri-Sucrose Vaccine 02/27/2019, 03/20/2019, 10/16/2019, 10/24/2021   PFIZER(Purple Top)SARS-COV-2 Vaccination 02/27/2019, 03/20/2019, 10/16/2019, 08/23/2020   Pneumococcal Conjugate-13 09/30/2020   Pneumococcal Polysaccharide-23 11/14/2021    TDAP status: Due, Education has been provided regarding the importance of this vaccine. Advised may receive this vaccine at local pharmacy or Health Dept. Aware to provide a copy of the vaccination record if obtained from local pharmacy or Health Dept. Verbalized acceptance and understanding.  Flu Vaccine status: Up to date  Pneumococcal vaccine status: Up to date  Covid-19 vaccine status: Completed vaccines  Qualifies for Shingles Vaccine? Yes   Zostavax completed No   Shingrix Completed?: No.    Education has been provided regarding the importance of this vaccine. Patient has been advised to call insurance company to determine out of pocket expense if they have not yet received this vaccine. Advised may also receive vaccine at local pharmacy or Health Dept. Verbalized acceptance  and understanding.  Screening Tests Health Maintenance  Topic Date Due   DTaP/Tdap/Td (1 - Tdap) Never done   Zoster Vaccines- Shingrix (1 of 2) Never done   COVID-19 Vaccine (9 - 2023-24 season) 12/19/2021   OPHTHALMOLOGY EXAM  02/13/2022   HEMOGLOBIN A1C  05/15/2022   Diabetic kidney evaluation - eGFR measurement  08/06/2022   MAMMOGRAM  11/13/2022   Diabetic kidney evaluation - Urine ACR  11/15/2022   FOOT EXAM  11/15/2022   Medicare Annual Wellness (AWV)  02/17/2023   COLONOSCOPY (Pts 45-17yrs Insurance coverage will need to be confirmed)  11/11/2024   Pneumonia Vaccine 65+ Years old  Completed   INFLUENZA VACCINE  Completed   DEXA SCAN  Completed   Hepatitis C Screening  Completed   HPV VACCINES  Aged Out    Health Maintenance  Health Maintenance Due  Topic Date Due   DTaP/Tdap/Td (1 - Tdap) Never done   Zoster  Vaccines- Shingrix (1 of 2) Never done   COVID-19 Vaccine (9 - 2023-24 season) 12/19/2021   OPHTHALMOLOGY EXAM  02/13/2022    Colorectal cancer screening: Type of screening: Colonoscopy. Completed 11/12/14. Repeat every 10 years  Mammogram status: Completed 11/12/21. Repeat every year  Bone Density status: Completed 10/18/20. Results reflect: Bone density results: NORMAL. Repeat every 5 years.  Lung Cancer Screening: (Low Dose CT Chest recommended if Age 17-80 years, 30 pack-year currently smoking OR have quit w/in 15years.) does not qualify.   Additional Screening:  Hepatitis C Screening: does qualify; Completed 09/30/20  Vision Screening: Recommended annual ophthalmology exams for early detection of glaucoma and other disorders of the eye. Is the patient up to date with their annual eye exam?  Yes  Who is the provider or what is the name of the office in which the patient attends annual eye exams? Dr.King If pt is not established with a provider, would they like to be referred to a provider to establish care? No .   Dental Screening: Recommended annual dental exams for proper oral hygiene  Community Resource Referral / Chronic Care Management: CRR required this visit?  No   CCM required this visit?  No      Plan:     I have personally reviewed and noted the following in the patient's chart:   Medical and social history Use of alcohol, tobacco or illicit drugs  Current medications and supplements including opioid prescriptions. Patient is not currently taking opioid prescriptions. Functional ability and status Nutritional status Physical activity Advanced directives List of other physicians Hospitalizations, surgeries, and ER visits in previous 12 months Vitals Screenings to include cognitive, depression, and falls Referrals and appointments  In addition, I have reviewed and discussed with patient certain preventive protocols, quality metrics, and best practice  recommendations. A written personalized care plan for preventive services as well as general preventive health recommendations were provided to patient.     Dionisio David, LPN   03/19/3426   Nurse Notes: none

## 2022-02-16 NOTE — Patient Instructions (Signed)
Christie Bryant , Thank you for taking time to come for your Medicare Wellness Visit. I appreciate your ongoing commitment to your health goals. Please review the following plan we discussed and let me know if I can assist you in the future.   These are the goals we discussed:  Goals      DIET - EAT MORE FRUITS AND VEGETABLES     Weight (lb) < 200 lb (90.7 kg)     Loose weight 40lbs        This is a list of the screening recommended for you and due dates:  Health Maintenance  Topic Date Due   DTaP/Tdap/Td vaccine (1 - Tdap) Never done   Zoster (Shingles) Vaccine (1 of 2) Never done   COVID-19 Vaccine (9 - 2023-24 season) 12/19/2021   Eye exam for diabetics  02/13/2022   Hemoglobin A1C  05/15/2022   Yearly kidney function blood test for diabetes  08/06/2022   Mammogram  11/13/2022   Yearly kidney health urinalysis for diabetes  11/15/2022   Complete foot exam   11/15/2022   Medicare Annual Wellness Visit  02/17/2023   Colon Cancer Screening  11/11/2024   Pneumonia Vaccine  Completed   Flu Shot  Completed   DEXA scan (bone density measurement)  Completed   Hepatitis C Screening: USPSTF Recommendation to screen - Ages 17-79 yo.  Completed   HPV Vaccine  Aged Out    Advanced directives: no  Conditions/risks identified: none  Next appointment: Follow up in one year for your annual wellness visit 02/22/23 @ 3 pm by phone   Preventive Care 65 Years and Older, Female Preventive care refers to lifestyle choices and visits with your health care provider that can promote health and wellness. What does preventive care include? A yearly physical exam. This is also called an annual well check. Dental exams once or twice a year. Routine eye exams. Ask your health care provider how often you should have your eyes checked. Personal lifestyle choices, including: Daily care of your teeth and gums. Regular physical activity. Eating a healthy diet. Avoiding tobacco and drug use. Limiting  alcohol use. Practicing safe sex. Taking low-dose aspirin every day. Taking vitamin and mineral supplements as recommended by your health care provider. What happens during an annual well check? The services and screenings done by your health care provider during your annual well check will depend on your age, overall health, lifestyle risk factors, and family history of disease. Counseling  Your health care provider may ask you questions about your: Alcohol use. Tobacco use. Drug use. Emotional well-being. Home and relationship well-being. Sexual activity. Eating habits. History of falls. Memory and ability to understand (cognition). Work and work Statistician. Reproductive health. Screening  You may have the following tests or measurements: Height, weight, and BMI. Blood pressure. Lipid and cholesterol levels. These may be checked every 5 years, or more frequently if you are over 24 years old. Skin check. Lung cancer screening. You may have this screening every year starting at age 80 if you have a 30-pack-year history of smoking and currently smoke or have quit within the past 15 years. Fecal occult blood test (FOBT) of the stool. You may have this test every year starting at age 12. Flexible sigmoidoscopy or colonoscopy. You may have a sigmoidoscopy every 5 years or a colonoscopy every 10 years starting at age 3. Hepatitis C blood test. Hepatitis B blood test. Sexually transmitted disease (STD) testing. Diabetes screening. This is done by  checking your blood sugar (glucose) after you have not eaten for a while (fasting). You may have this done every 1-3 years. Bone density scan. This is done to screen for osteoporosis. You may have this done starting at age 53. Mammogram. This may be done every 1-2 years. Talk to your health care provider about how often you should have regular mammograms. Talk with your health care provider about your test results, treatment options, and if  necessary, the need for more tests. Vaccines  Your health care provider may recommend certain vaccines, such as: Influenza vaccine. This is recommended every year. Tetanus, diphtheria, and acellular pertussis (Tdap, Td) vaccine. You may need a Td booster every 10 years. Zoster vaccine. You may need this after age 68. Pneumococcal 13-valent conjugate (PCV13) vaccine. One dose is recommended after age 80. Pneumococcal polysaccharide (PPSV23) vaccine. One dose is recommended after age 78. Talk to your health care provider about which screenings and vaccines you need and how often you need them. This information is not intended to replace advice given to you by your health care provider. Make sure you discuss any questions you have with your health care provider. Document Released: 01/25/2015 Document Revised: 09/18/2015 Document Reviewed: 10/30/2014 Elsevier Interactive Patient Education  2017 [place] Prevention in the Home Falls can cause injuries. They can happen to people of all ages. There are many things you can do to make your home safe and to help prevent falls. What can I do on the outside of my home? Regularly fix the edges of walkways and driveways and fix any cracks. Remove anything that might make you trip as you walk through a door, such as a raised step or threshold. Trim any bushes or trees on the path to your home. Use bright outdoor lighting. Clear any walking paths of anything that might make someone trip, such as rocks or tools. Regularly check to see if handrails are loose or broken. Make sure that both sides of any steps have handrails. Any raised decks and porches should have guardrails on the edges. Have any leaves, snow, or ice cleared regularly. Use sand or salt on walking paths during winter. Clean up any spills in your garage right away. This includes oil or grease spills. What can I do in the bathroom? Use night lights. Install grab bars by the toilet  and in the tub and shower. Do not use towel bars as grab bars. Use non-skid mats or decals in the tub or shower. If you need to sit down in the shower, use a plastic, non-slip stool. Keep the floor dry. Clean up any water that spills on the floor as soon as it happens. Remove soap buildup in the tub or shower regularly. Attach bath mats securely with double-sided non-slip rug tape. Do not have throw rugs and other things on the floor that can make you trip. What can I do in the bedroom? Use night lights. Make sure that you have a light by your bed that is easy to reach. Do not use any sheets or blankets that are too big for your bed. They should not hang down onto the floor. Have a firm chair that has side arms. You can use this for support while you get dressed. Do not have throw rugs and other things on the floor that can make you trip. What can I do in the kitchen? Clean up any spills right away. Avoid walking on wet floors. Keep items that you use a  lot in easy-to-reach places. If you need to reach something above you, use a strong step stool that has a grab bar. Keep electrical cords out of the way. Do not use floor polish or wax that makes floors slippery. If you must use wax, use non-skid floor wax. Do not have throw rugs and other things on the floor that can make you trip. What can I do with my stairs? Do not leave any items on the stairs. Make sure that there are handrails on both sides of the stairs and use them. Fix handrails that are broken or loose. Make sure that handrails are as long as the stairways. Check any carpeting to make sure that it is firmly attached to the stairs. Fix any carpet that is loose or worn. Avoid having throw rugs at the top or bottom of the stairs. If you do have throw rugs, attach them to the floor with carpet tape. Make sure that you have a light switch at the top of the stairs and the bottom of the stairs. If you do not have them, ask someone to add  them for you. What else can I do to help prevent falls? Wear shoes that: Do not have high heels. Have rubber bottoms. Are comfortable and fit you well. Are closed at the toe. Do not wear sandals. If you use a stepladder: Make sure that it is fully opened. Do not climb a closed stepladder. Make sure that both sides of the stepladder are locked into place. Ask someone to hold it for you, if possible. Clearly mark and make sure that you can see: Any grab bars or handrails. First and last steps. Where the edge of each step is. Use tools that help you move around (mobility aids) if they are needed. These include: Canes. Walkers. Scooters. Crutches. Turn on the lights when you go into a dark area. Replace any light bulbs as soon as they burn out. Set up your furniture so you have a clear path. Avoid moving your furniture around. If any of your floors are uneven, fix them. If there are any pets around you, be aware of where they are. Review your medicines with your doctor. Some medicines can make you feel dizzy. This can increase your chance of falling. Ask your doctor what other things that you can do to help prevent falls. This information is not intended to replace advice given to you by your health care provider. Make sure you discuss any questions you have with your health care provider. Document Released: 10/25/2008 Document Revised: 06/06/2015 Document Reviewed: 02/02/2014 Elsevier Interactive Patient Education  2017 [employer].

## 2022-02-17 ENCOUNTER — Encounter: Payer: HMO | Admitting: Family Medicine

## 2022-02-20 ENCOUNTER — Ambulatory Visit (INDEPENDENT_AMBULATORY_CARE_PROVIDER_SITE_OTHER): Payer: PPO | Admitting: Family Medicine

## 2022-02-20 ENCOUNTER — Encounter: Payer: Self-pay | Admitting: Family Medicine

## 2022-02-20 VITALS — BP 105/69 | HR 76 | Temp 98.8°F | Ht 70.0 in | Wt 211.4 lb

## 2022-02-20 DIAGNOSIS — M1A9XX0 Chronic gout, unspecified, without tophus (tophi): Secondary | ICD-10-CM | POA: Diagnosis not present

## 2022-02-20 DIAGNOSIS — E538 Deficiency of other specified B group vitamins: Secondary | ICD-10-CM | POA: Diagnosis not present

## 2022-02-20 DIAGNOSIS — Z23 Encounter for immunization: Secondary | ICD-10-CM | POA: Diagnosis not present

## 2022-02-20 DIAGNOSIS — I10 Essential (primary) hypertension: Secondary | ICD-10-CM

## 2022-02-20 DIAGNOSIS — Z794 Long term (current) use of insulin: Secondary | ICD-10-CM

## 2022-02-20 DIAGNOSIS — S61411A Laceration without foreign body of right hand, initial encounter: Secondary | ICD-10-CM | POA: Diagnosis not present

## 2022-02-20 DIAGNOSIS — E039 Hypothyroidism, unspecified: Secondary | ICD-10-CM

## 2022-02-20 DIAGNOSIS — E1142 Type 2 diabetes mellitus with diabetic polyneuropathy: Secondary | ICD-10-CM | POA: Diagnosis not present

## 2022-02-20 DIAGNOSIS — E785 Hyperlipidemia, unspecified: Secondary | ICD-10-CM

## 2022-02-20 DIAGNOSIS — Z0000 Encounter for general adult medical examination without abnormal findings: Secondary | ICD-10-CM | POA: Diagnosis not present

## 2022-02-20 MED ORDER — METOPROLOL TARTRATE 100 MG PO TABS: 100.0000 mg | ORAL_TABLET | Freq: Two times a day (BID) | ORAL | 0 refills | Status: DC

## 2022-02-20 MED ORDER — ASPIRIN 81 MG PO CHEW: 81.0000 mg | CHEWABLE_TABLET | Freq: Every day | ORAL | Status: AC

## 2022-02-20 MED ORDER — TRIAMTERENE-HCTZ 75-50 MG PO TABS: ORAL_TABLET | ORAL | 1 refills | Status: DC

## 2022-02-20 MED ORDER — TRULICITY 4.5 MG/0.5ML ~~LOC~~ SOAJ: 4.5000 mg | SUBCUTANEOUS | 3 refills | Status: DC

## 2022-02-20 MED ORDER — ATORVASTATIN CALCIUM 80 MG PO TABS: 80.0000 mg | ORAL_TABLET | Freq: Every day | ORAL | 0 refills | Status: DC

## 2022-02-20 MED ORDER — METFORMIN HCL 1000 MG PO TABS: 1000.0000 mg | ORAL_TABLET | Freq: Two times a day (BID) | ORAL | 1 refills | Status: DC

## 2022-02-20 NOTE — Progress Notes (Signed)
BP 105/69   Pulse 76   Temp 98.8 F (37.1 C) (Oral)   Ht 5' 10"$  (1.778 m)   Wt 211 lb 6.4 oz (95.9 kg)   SpO2 97%   BMI 30.33 kg/m    Subjective:    Patient ID: Christie Bryant, female    DOB: 01-26-1955, 67 y.o.   MRN: [id number]  HPI: Christie Bryant is a 67 y.o. female presenting on 02/20/2022 for comprehensive medical examination. Current medical complaints include:  DIABETES Hypoglycemic episodes:{Blank single:19197::"yes","no"} Polydipsia/polyuria: {Blank single:19197::"yes","no"} Visual disturbance: {Blank single:19197::"yes","no"} Chest pain: {Blank single:19197::"yes","no"} Paresthesias: {Blank single:19197::"yes","no"} Glucose Monitoring: {Blank single:19197::"yes","no"}  Accucheck frequency: {Blank single:19197::"Not Checking","Daily","BID","TID"}  Fasting glucose:  Post prandial:  Evening:  Before meals: Taking Insulin?: yes  Long acting insulin: occasionally takes 40 units  Blood Pressure Monitoring: {Blank single:19197::"not checking","rarely","daily","weekly","monthly","a few times a day","a few times a week","a few times a month"} Retinal Examination: {Blank single:19197::"Up to Date","Not up to Date"} Foot Exam: {Blank single:19197::"Up to Date","Not up to Date"} Diabetic Education: {Blank single:19197::"Completed","Not Completed"} Pneumovax: {Blank single:19197::"Up to Date","Not up to Date","unknown"} Influenza: {Blank single:19197::"Up to Date","Not up to Date","unknown"} Aspirin: {Blank single:19197::"yes","no"}  HYPERTENSION / HYPERLIPIDEMIA Satisfied with current treatment? {Blank single:19197::"yes","no"} Duration of hypertension: {Blank single:19197::"chronic","months","years"} BP monitoring frequency: {Blank single:19197::"not checking","rarely","daily","weekly","monthly","a few times a day","a few times a week","a few times a month"} BP range:  BP medication side effects: {Blank single:19197::"yes","no"} Past BP meds: {Blank  [id number] (bystolic)","carvedilol","chlorthalidone","clonidine","diltiazem","exforge HCT","HCTZ","irbesartan (avapro)","labetalol","lisinopril","lisinopril-HCTZ","losartan (cozaar)","methyldopa","nifedipine","olmesartan (benicar)","olmesartan-HCTZ","quinapril","ramipril","spironalactone","tekturna","valsartan","valsartan-HCTZ","verapamil"} Duration of hyperlipidemia: {Blank single:19197::"chronic","months","years"} Cholesterol medication side effects: {Blank single:19197::"yes","no"} Cholesterol supplements: {Blank multiple:19196::"none","fish oil","niacin","red yeast rice"} Past cholesterol medications: {Blank multiple:19196::"none","atorvastain (lipitor)","lovastatin (mevacor)","pravastatin (pravachol)","rosuvastatin (crestor)","simvastatin (zocor)","vytorin","fenofibrate (tricor)","gemfibrozil","ezetimide (zetia)","niaspan","lovaza"} Medication compliance: {Blank single:19197::"excellent compliance","good compliance","fair compliance","poor compliance"} Aspirin: {Blank single:19197::"yes","no"} Recent stressors: {Blank single:19197::"yes","no"} Recurrent headaches: {Blank single:19197::"yes","no"} Visual changes: {Blank single:19197::"yes","no"} Palpitations: {Blank single:19197::"yes","no"} Dyspnea: {Blank single:19197::"yes","no"} Chest pain: {Blank single:19197::"yes","no"} Lower extremity edema: {Blank single:19197::"yes","no"} Dizzy/lightheaded: {Blank single:19197::"yes","no"}  HYPOTHYROIDISM Thyroid control status:controlled Satisfied with current treatment? yes Medication side effects: no Medication compliance: excellent compliance Etiology of hypothyroidism:  Recent dose adjustment:no Fatigue: no Cold intolerance: no Heat intolerance: no Weight gain: no Weight loss: no Constipation: no Diarrhea/loose stools: no Palpitations: no Lower extremity edema: no Anxiety/depressed  mood: no  She currently lives with: husband Menopausal Symptoms: no  Depression Screen done today and results listed below:     02/20/2022    3:21 PM 02/16/2022    3:20 PM 11/14/2021    3:09 PM 08/05/2021    2:03 PM 06/02/2021    1:47 PM  Depression screen PHQ 2/9  Decreased Interest 0 0 3 0 0  Down, Depressed, Hopeless 0 0 0 0 0  PHQ - 2 Score 0 0 3 0 0  Altered sleeping 0 0 3 0 0  Tired, decreased energy 0 0 3 3 1  $ Change in appetite 0 0 0 0 0  Feeling bad or failure about yourself  0 0 0 0 0  Trouble concentrating 0 0 0 0 0  Moving slowly or fidgety/restless 0 0 0 0 0  Suicidal thoughts 0 0 0 0 0  PHQ-9 Score 0 0 9 3 1  $ Difficult doing work/chores Not difficult at all Not difficult at all       Past Medical History:  Past Medical History:  Diagnosis Date  . Chicken pox   . Chronic kidney disease    Stones  . Diabetes mellitus 2012   Dr. Gabriel Carina  . Hyperlipidemia   . Hypertension   . Neuromuscular disorder ([place])   . Recurrent  boils   . Stroke ([place]) 04/24/2021  . Thyroid disease     Surgical History:  Past Surgical History:  Procedure Laterality Date  . BIOPSY THYROID     normal, Dr. Gabriel Carina  . BREAST REDUCTION SURGERY    . COLONOSCOPY WITH PROPOFOL N/A 08/13/2014   Procedure: COLONOSCOPY WITH PROPOFOL;  Surgeon: Hulen Luster, MD;  Location: [facility] ENDOSCOPY;  Service: Gastroenterology;  Laterality: N/A;  . COLONOSCOPY WITH PROPOFOL N/A 11/12/2014   Procedure: COLONOSCOPY WITH PROPOFOL;  Surgeon: Hulen Luster, MD;  Location: [facility] ENDOSCOPY;  Service: Gastroenterology;  Laterality: N/A;  . COSMETIC SURGERY    . gluteal abscess drainage  03/17/2013  . TOTAL ABDOMINAL HYSTERECTOMY W/ BILATERAL SALPINGOOPHORECTOMY  01/13/1996   complete  . TUBAL LIGATION      Medications:  Current Outpatient Medications on File Prior to Visit  Medication Sig  . acetaminophen (TYLENOL) 325 MG tablet Take 2 tablets (650 mg total) by mouth every 6 (six) hours as needed for mild pain (or Fever  >/= 101).  Marland Kitchen aspirin 81 MG chewable tablet Chew 1 tablet (81 mg total) by mouth daily.  Marland Kitchen atorvastatin (LIPITOR) 80 MG tablet TAKE 1 TABLET BY MOUTH AT BEDTIME  . Cholecalciferol (VITAMIN D-3 PO) Take 2,000 Units by mouth daily.   . Dulaglutide (TRULICITY) 3 0000000 SOPN Inject 3 mg as directed once a week.  Marland Kitchen glucose blood (ONETOUCH ULTRA) test strip 1 each by Other route as needed for other. Testing 3-4x a day  . Insulin Pen Needle (PEN NEEDLES 3/16") 31G X 5 MM MISC 1 each by Does not apply route once a week.  . levothyroxine (SYNTHROID) 125 MCG tablet TAKE 1 TABLET BY MOUTH ONCE DAILY ON  AN  EMPTY  STOMACH  WITH  A  GLASS  OF  WATER  AT  LEAST  30  TO  60  MINUTES  BEFORE  BREAKFAST  . meclizine (ANTIVERT) 25 MG tablet Take 1 tablet (25 mg total) by mouth 3 (three) times daily as needed for dizziness.  . metFORMIN (GLUCOPHAGE) 1000 MG tablet Take 1 tablet (1,000 mg total) by mouth 2 (two) times daily with a meal.  . metoprolol tartrate (LOPRESSOR) 100 MG tablet Take 1 tablet by mouth twice daily  . nortriptyline (PAMELOR) 10 MG capsule Take 1 capsule (10 mg total) by mouth at bedtime.  Marland Kitchen omeprazole (PRILOSEC) 20 MG capsule Take 20 mg by mouth daily.  Marland Kitchen triamterene-hydrochlorothiazide (MAXZIDE) 75-50 MG tablet Take 1/2 (one-half) tablet by mouth once daily  . vitamin B-12 (CYANOCOBALAMIN) 1000 MCG tablet Take 1 tablet (1,000 mcg total) by mouth daily.   No current facility-administered medications on file prior to visit.    Allergies:  No Known Allergies  Social History:  Social History   Socioeconomic History  . Marital status: Married    Spouse name: Juanda Crumble  . Number of children: 2  . Years of education: Not on file  . Highest education level: Some college, no degree  Occupational History  . Not on file  Tobacco Use  . Smoking status: Never  . Smokeless tobacco: Never  Vaping Use  . Vaping Use: Never used  Substance and Sexual Activity  . Alcohol use: No  . Drug use:  No  . Sexual activity: Not Currently  Other Topics Concern  . Not on file  Social History Narrative   Lives in [place] with husband, from [place].    Work - Engineer, site Determinants of Health  Financial Resource Strain: Low Risk  (02/16/2022)   Overall Financial Resource Strain (CARDIA)   . Difficulty of Paying Living Expenses: Not hard at all  Food Insecurity: No Food Insecurity (02/16/2022)   Hunger Vital Sign   . Worried About Charity fundraiser in the Last Year: Never true   . Ran Out of Food in the Last Year: Never true  Transportation Needs: No Transportation Needs (02/16/2022)   PRAPARE - Transportation   . Lack of Transportation (Medical): No   . Lack of Transportation (Non-Medical): No  Physical Activity: Inactive (02/16/2022)   Exercise Vital Sign   . Days of Exercise per Week: 0 days   . Minutes of Exercise per Session: 0 min  Stress: No Stress Concern Present (02/16/2022)   [place]   . Feeling of Stress : Only a little  Social Connections: Socially Integrated (02/16/2022)   Social Connection and Isolation Panel [NHANES]   . Frequency of Communication with Friends and Family: More than three times a week   . Frequency of Social Gatherings with Friends and Family: Once a week   . Attends Religious Services: More than 4 times per year   . Active Member of Clubs or Organizations: Yes   . Attends Archivist Meetings: More than 4 times per year   . Marital Status: Married  Human resources officer Violence: Not At Risk (02/16/2022)   Humiliation, Afraid, Rape, and Kick questionnaire   . Fear of Current or Ex-Partner: No   . Emotionally Abused: No   . Physically Abused: No   . Sexually Abused: No   Social History   Tobacco Use  Smoking Status Never  Smokeless Tobacco Never   Social History   Substance and Sexual Activity  Alcohol Use No    Family History:  Family History  Problem  Relation Age of Onset  . Cancer Mother        ovarian/uterus  . Hyperlipidemia Father   . Heart disease Father   . Hypertension Father   . Cancer Father        throat  . Hypertension Sister   . Hypertension Sister   . Hypertension Sister   . Liver disease Sister   . Heart disease Brother   . Hypertension Brother   . Stroke Maternal Grandmother   . Hypertension Maternal Grandmother   . Hypertension Maternal Grandfather   . Pancreatic cancer Maternal Grandfather   . Stroke Paternal Grandmother   . Stroke Paternal Grandfather   . Hypertension Other   . Heart disease Other   . Sudden death Other   . Diabetes Other     Past medical history, surgical history, medications, allergies, family history and social history reviewed with patient today and changes made to appropriate areas of the chart.   Review of Systems  Constitutional: Negative.   HENT: Negative.    Eyes: Negative.   Respiratory: Negative.    Cardiovascular: Negative.   Gastrointestinal: Negative.   Genitourinary: Negative.   Musculoskeletal: Negative.   Skin: Negative.   Neurological:  Positive for dizziness. Negative for tingling, tremors, sensory change, speech change, focal weakness, seizures, loss of consciousness, weakness and headaches.  Endo/Heme/Allergies: Negative.   Psychiatric/Behavioral: Negative.     All other ROS negative except what is listed above and in the HPI.      Objective:    BP 105/69   Pulse 76   Temp 98.8 F (37.1 C) (Oral)  Ht 5' 10"$  (1.778 m)   Wt 211 lb 6.4 oz (95.9 kg)   SpO2 97%   BMI 30.33 kg/m   Wt Readings from Last 3 Encounters:  02/20/22 211 lb 6.4 oz (95.9 kg)  02/16/22 205 lb (93 kg)  11/14/21 205 lb 14.4 oz (93.4 kg)    Physical Exam  Results for orders placed or performed in visit on 11/14/21  HM DEXA SCAN  Result Value Ref Range   HM Dexa Scan see result scanned into chart       Assessment & Plan:   Problem List Items Addressed This Visit   None     Follow up plan: No follow-ups on file.   LABORATORY TESTING:  - Pap smear: not applicable  IMMUNIZATIONS:   - Tdap: Tetanus vaccination status reviewed: {tetanus status:315746}. - Influenza: Up to date - Pneumovax: Up to date - Prevnar: Up to date - COVID: Up to date - HPV: Not applicable - Shingrix vaccine: {Blank single:19197::"Up to date","Administered today","Not applicable","Refused","Given elsewhere"}  SCREENING: -Mammogram: Up to date  - Colonoscopy: Up to date   PATIENT COUNSELING:   Advised to take 1 mg of folate supplement per day if capable of pregnancy.   Sexuality: Discussed sexually transmitted diseases, partner selection, use of condoms, avoidance of unintended pregnancy  and contraceptive alternatives.   Advised to avoid cigarette smoking.  I discussed with the patient that most people either abstain from alcohol or drink within safe limits (<=14/week and <=4 drinks/occasion for males, <=7/weeks and <= 3 drinks/occasion for females) and that the risk for alcohol disorders and other health effects rises proportionally with the number of drinks per week and how often a drinker exceeds daily limits.  Discussed cessation/primary prevention of drug use and availability of treatment for abuse.   Diet: Encouraged to adjust caloric intake to maintain  or achieve ideal body weight, to reduce intake of dietary saturated fat and total fat, to limit sodium intake by avoiding high sodium foods and not adding table salt, and to maintain adequate dietary potassium and calcium preferably from fresh fruits, vegetables, and low-fat dairy products.    stressed the importance of regular exercise  Injury prevention: Discussed safety belts, safety helmets, smoke detector, smoking near bedding or upholstery.   Dental health: Discussed importance of regular tooth brushing, flossing, and dental visits.    NEXT PREVENTATIVE PHYSICAL DUE IN 1 YEAR. No follow-ups on  file.

## 2022-02-21 LAB — COMPREHENSIVE METABOLIC PANEL
ALT: 35 IU/L — ABNORMAL HIGH (ref 0–32)
AST: 33 IU/L (ref 0–40)
Albumin/Globulin Ratio: 1.4 (ref 1.2–2.2)
Albumin: 4.1 g/dL (ref 3.9–4.9)
Alkaline Phosphatase: 84 IU/L (ref 44–121)
BUN/Creatinine Ratio: 15 (ref 12–28)
BUN: 16 mg/dL (ref 8–27)
Bilirubin Total: 1 mg/dL (ref 0.0–1.2)
CO2: 19 mmol/L — ABNORMAL LOW (ref 20–29)
Calcium: 10.2 mg/dL (ref 8.7–10.3)
Chloride: 105 mmol/L (ref 96–106)
Creatinine, Ser: 1.04 mg/dL — ABNORMAL HIGH (ref 0.57–1.00)
Globulin, Total: 2.9 g/dL (ref 1.5–4.5)
Glucose: 135 mg/dL — ABNORMAL HIGH (ref 70–99)
Potassium: 4.3 mmol/L (ref 3.5–5.2)
Sodium: 141 mmol/L (ref 134–144)
Total Protein: 7 g/dL (ref 6.0–8.5)
eGFR: 59 mL/min/{1.73_m2} — ABNORMAL LOW (ref >59)

## 2022-02-21 LAB — CBC WITH DIFFERENTIAL/PLATELET
Basophils Absolute: 0 10*3/uL (ref 0.0–0.2)
Basos: 0 %
EOS (ABSOLUTE): 0.4 10*3/uL (ref 0.0–0.4)
Eos: 7 %
Hematocrit: 41.4 % (ref 34.0–46.6)
Hemoglobin: 13.7 g/dL (ref 11.1–15.9)
Immature Grans (Abs): 0 10*3/uL (ref 0.0–0.1)
Immature Granulocytes: 0 %
Lymphocytes Absolute: 1.7 10*3/uL (ref 0.7–3.1)
Lymphs: 28 %
MCH: 31.9 pg (ref 26.6–33.0)
MCHC: 33.1 g/dL (ref 31.5–35.7)
MCV: 97 fL (ref 79–97)
Monocytes Absolute: 0.5 10*3/uL (ref 0.1–0.9)
Monocytes: 8 %
Neutrophils Absolute: 3.4 10*3/uL (ref 1.4–7.0)
Neutrophils: 57 %
Platelets: 164 10*3/uL (ref 150–450)
RBC: 4.29 x10E6/uL (ref 3.77–5.28)
RDW: 12.7 % (ref 11.7–15.4)
WBC: 6 10*3/uL (ref 3.4–10.8)

## 2022-02-21 LAB — LIPID PANEL W/O CHOL/HDL RATIO
Cholesterol, Total: 122 mg/dL (ref 100–199)
HDL: 40 mg/dL (ref >39)
LDL Chol Calc (NIH): 66 mg/dL (ref 0–99)
Triglycerides: 79 mg/dL (ref 0–149)
VLDL Cholesterol Cal: 16 mg/dL (ref 5–40)

## 2022-02-21 LAB — TSH: TSH: 0.515 u[IU]/mL (ref 0.450–4.500)

## 2022-02-21 LAB — URIC ACID: Uric Acid: 7.5 mg/dL — ABNORMAL HIGH (ref 3.0–7.2)

## 2022-02-21 LAB — HGB A1C W/O EAG: Hgb A1c MFr Bld: 6.6 % — ABNORMAL HIGH (ref 4.8–5.6)

## 2022-02-22 DIAGNOSIS — E538 Deficiency of other specified B group vitamins: Secondary | ICD-10-CM | POA: Insufficient documentation

## 2022-02-22 NOTE — Assessment & Plan Note (Signed)
Under good control on current regimen. Continue current regimen. Continue to monitor. Call with any concerns. Refills given. Labs drawn today.   

## 2022-02-22 NOTE — Assessment & Plan Note (Signed)
Rechecking labs today. Await results. Treat as needed.  °

## 2022-02-22 NOTE — Assessment & Plan Note (Signed)
Unable to tolerate amitriptyline. Has stopped. Not having a problem at this time. Continue to monitor.

## 2022-02-22 NOTE — Assessment & Plan Note (Addendum)
Will recheck A1c today. Will increase her trulicity to [id number] with goal of continuing to titrate off her insulin which she is only using occasionally now.

## 2022-02-22 NOTE — Assessment & Plan Note (Signed)
Goal of increasing trulicity and stopping insulin. Await A1c. Treat as needed.

## 2022-02-23 ENCOUNTER — Telehealth: Payer: Self-pay

## 2022-02-23 LAB — URINALYSIS, ROUTINE W REFLEX MICROSCOPIC
Bilirubin, UA: NEGATIVE
Glucose, UA: NEGATIVE
Ketones, UA: NEGATIVE
Nitrite, UA: NEGATIVE
Protein,UA: NEGATIVE
RBC, UA: NEGATIVE
Specific Gravity, UA: 1.014 (ref 1.005–1.030)
Urobilinogen, Ur: 1 mg/dL (ref 0.2–1.0)
pH, UA: 6 (ref 5.0–7.5)

## 2022-02-23 LAB — MICROSCOPIC EXAMINATION
Casts: NONE SEEN /lpf
RBC, Urine: NONE SEEN /hpf (ref 0–2)
WBC, UA: >30 /hpf — AB (ref 0–5)

## 2022-02-23 LAB — MICROALBUMIN / CREATININE URINE RATIO
Creatinine, Urine: 70.6 mg/dL
Microalb/Creat Ratio: 73 mg/g creat — ABNORMAL HIGH (ref 0–29)
Microalbumin, Urine: 51.2 ug/mL

## 2022-02-23 NOTE — Telephone Encounter (Signed)
Prior authorization was initiated via CoverMyMeds for prescription of Trulicity. Awaiting determination from patient's insurance.   [id number]

## 2022-02-24 ENCOUNTER — Encounter: Payer: Self-pay | Admitting: Family Medicine

## 2022-03-04 ENCOUNTER — Encounter: Payer: Self-pay | Admitting: Family Medicine

## 2022-03-06 ENCOUNTER — Other Ambulatory Visit: Payer: Self-pay | Admitting: Family Medicine

## 2022-03-06 NOTE — Telephone Encounter (Signed)
Requested Prescriptions  Pending Prescriptions Disp Refills   atorvastatin (LIPITOR) 80 MG tablet [Pharmacy Med Name: Atorvastatin Calcium 80 MG Oral Tablet] 90 tablet 3    Sig: TAKE 1 TABLET BY MOUTH AT BEDTIME     Cardiovascular:  Antilipid - Statins Failed - 03/06/2022  3:19 PM      Failed - Lipid Panel in normal range within the last 12 months    Cholesterol, Total  Date Value Ref Range Status  02/20/2022 122 100 - 199 mg/dL Final   LDL Chol Calc (NIH)  Date Value Ref Range Status  02/20/2022 66 0 - 99 mg/dL Final   Direct LDL  Date Value Ref Range Status  04/24/2021 104.5 (H) 0 - 99 mg/dL Final    Comment:    Performed at [facility], [place] [address] [place], [place] [address]   HDL  Date Value Ref Range Status  02/20/2022 40 >39 mg/dL Final   Triglycerides  Date Value Ref Range Status  02/20/2022 79 0 - 149 mg/dL Final         Passed - Patient is not pregnant      Passed - Valid encounter within last 12 months    Recent Outpatient Visits           2 weeks ago Routine general medical examination at a health care facility   [facility], [place] P, DO   3 months ago Diabetic polyneuropathy associated with type 2 diabetes mellitus ([place])   [place], Megan P, DO   7 months ago DM type 2 with diabetic peripheral neuropathy [facility])   [place], Megan P, DO   9 months ago Hypothyroidism, unspecified type   [place], DO   10 months ago History of cerebellar stroke   [place], [place], DO       Future Appointments             In 2 months Wynetta Emery, Barb Merino, DO [place], [facility]

## 2022-03-17 LAB — SPECIMEN STATUS REPORT

## 2022-03-17 LAB — VITAMIN B12: Vitamin B-12: >2000 pg/mL — ABNORMAL HIGH (ref 232–1245)

## 2022-03-30 ENCOUNTER — Ambulatory Visit: Payer: Self-pay

## 2022-03-30 NOTE — Patient Outreach (Signed)
Care Management & Coordination Services Pharmacy Note  03/30/2022 Name:  Christie Bryant MRN:  [id number] DOB:  1955/07/14  Summary: -Married 61 years in April [employer] for 13 years. 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 Will co-sign PCP to ask about therapy indication (Especially with patient's Hx of hypoglycemia and vertigo) -Per EPIC fill Hx, patient very non-compliant. I had patient go through dates of fills of meds and she is actually compliant on everything, EPIC is not up-to-date    Subjective: Christie Bryant is an 67 y.o. year old female who is a primary patient of Valerie Roys, DO.  The care coordination team was consulted for assistance with disease management and care coordination needs.    Engaged with patient by telephone for initial visit.    Objective:  Lab Results  Component Value Date   CREATININE 1.04 (H) 02/20/2022   BUN 16 02/20/2022   GFR 57.41 (L) 11/20/2013   EGFR 59 (L) 02/20/2022   GFRNONAA >60 04/28/2021   GFRAA >60 10/09/2019   NA 141 02/20/2022   K 4.3 02/20/2022   CALCIUM 10.2 02/20/2022   CO2 19 (L) 02/20/2022   GLUCOSE 135 (H) 02/20/2022    Lab Results  Component Value Date/Time   HGBA1C 6.6 (H) 02/20/2022 03:32 PM   HGBA1C 6.6 (H) 11/14/2021 03:09 PM   HGBA1C 5.9 (H) 08/05/2021 02:13 PM   HGBA1C 7.6 07/01/2020 12:00 AM   GFR 57.41 (L) 11/20/2013 08:38 AM   GFR 59.39 (L) 11/09/2013 08:05 AM   MICROALBUR 150 (H) 11/14/2021 03:35 PM   MICROALBUR 30 (H) 09/30/2020 03:38 PM    Last diabetic Eye exam:  Lab Results  Component Value Date/Time   HMDIABEYEEXA No Retinopathy 02/13/2021 12:00 AM    Last diabetic Foot exam:  Lab Results  Component Value Date/Time   HMDIABFOOTEX Normal 11/20/2013 12:00 AM     Lab Results  Component Value  Date   CHOL 122 02/20/2022   HDL 40 02/20/2022   LDLCALC 66 02/20/2022   LDLDIRECT 104.5 (H) 04/24/2021   TRIG 79 02/20/2022   CHOLHDL 5 11/20/2013       Latest Ref Rng & Units 02/20/2022    3:32 PM 08/05/2021    2:15 PM 04/24/2021   10:51 AM  Hepatic Function  Total Protein 6.0 - 8.5 g/dL 7.0  6.8  7.4   Albumin 3.9 - 4.9 g/dL 4.1  4.0  3.9   AST 0 - 40 IU/L 33  83  27   ALT 0 - 32 IU/L 35  93  22   Alk Phosphatase 44 - 121 IU/L 84  109  51   Total Bilirubin 0.0 - 1.2 mg/dL 1.0  1.1  1.3   Bilirubin, Direct 0.0 - 0.2 mg/dL   0.2     Lab Results  Component Value Date/Time   TSH 0.515 02/20/2022 03:32 PM   TSH 1.390 08/05/2021 02:15 PM   FREET4 0.95 01/16/2013 08:30 AM       Latest Ref Rng & Units 02/20/2022    3:32 PM 08/05/2021    2:15 PM 05/05/2021    1:41 PM  CBC  WBC 3.4 - 10.8 x10E3/uL 6.0  6.4  7.5   Hemoglobin 11.1 - 15.9 g/dL 13.7  12.8  15.1   Hematocrit 34.0 - 46.6 %  41.4  38.4  41.9   Platelets 150 - 450 x10E3/uL 164  145  240     Lab Results  Component Value Date/Time   VD25OH 61.3 08/05/2021 02:15 PM   VITAMINB12 >2000 (H) 02/20/2022 03:32 PM   VITAMINB12 390 08/05/2021 02:15 PM    Clinical ASCVD: Yes  The ASCVD Risk score (Arnett DK, et al., 2019) failed to calculate for the following reasons:   The patient has a prior MI or stroke diagnosis    Other: (CHADS2VASc if Afib, MMRC or CAT for COPD, ACT, DEXA)     02/20/2022    3:21 PM 02/16/2022    3:20 PM 11/14/2021    3:09 PM  Depression screen PHQ 2/9  Decreased Interest 0 0 3  Down, Depressed, Hopeless 0 0 0  PHQ - 2 Score 0 0 3  Altered sleeping 0 0 3  Tired, decreased energy 0 0 3  Change in appetite 0 0 0  Feeling bad or failure about yourself  0 0 0  Trouble concentrating 0 0 0  Moving slowly or fidgety/restless 0 0 0  Suicidal thoughts 0 0 0  PHQ-9 Score 0 0 9  Difficult doing work/chores Not difficult at all Not difficult at all      Social History   Tobacco Use  Smoking Status  Never  Smokeless Tobacco Never   BP Readings from Last 3 Encounters:  02/20/22 105/69  11/14/21 100/64  08/05/21 118/75   Pulse Readings from Last 3 Encounters:  02/20/22 76  11/14/21 75  08/05/21 76   Wt Readings from Last 3 Encounters:  02/20/22 211 lb 6.4 oz (95.9 kg)  02/16/22 205 lb (93 kg)  11/14/21 205 lb 14.4 oz (93.4 kg)   BMI Readings from Last 3 Encounters:  02/20/22 30.33 kg/m  02/16/22 29.41 kg/m  11/14/21 29.54 kg/m    No Known Allergies  Medications Reviewed Today     Reviewed by Lane Hacker, [facility] (Pharmacist) on 03/30/22 at 69  Med List Status: <None>   Medication Order Taking? Sig Documenting Provider Last Dose Status Informant  acetaminophen (TYLENOL) 325 MG tablet [id number]  Take 2 tablets (650 mg total) by mouth every 6 (six) hours as needed for mild pain (or Fever >/= 101). Terrilee Croak, MD  Active Self  aspirin 81 MG chewable tablet [id number] Yes Chew 1 tablet (81 mg total) by mouth daily. [place] P, DO Taking Active   atorvastatin (LIPITOR) 80 MG tablet [id number] Yes TAKE 1 TABLET BY MOUTH AT BEDTIME Wynetta Emery, Megan P, DO Taking Active   Cholecalciferol (VITAMIN D-3 PO) [id number] Yes Take 2,000 Units by mouth daily.  [provider] Taking Active Self  Dulaglutide (TRULICITY) 4.5 0000000 SOPN [id number] No Inject 4.5 mg as directed once a week.  Patient not taking: Reported on 03/30/2022   Valerie Roys, DO Not Taking Active   glucose blood (ONETOUCH ULTRA) test strip [id number]  1 each by Other route as needed for other. Testing 3-4x a day Johnson, Megan P, DO  Active   Insulin Pen Needle (PEN NEEDLES 3/16") 31G X 5 MM MISC [id number]  1 each by Does not apply route once a week. Park Liter P, DO  Active Self  levothyroxine (SYNTHROID) 125 MCG tablet [id number] Yes TAKE 1 TABLET BY MOUTH ONCE DAILY ON  AN  EMPTY  STOMACH  WITH  A  GLASS  OF  WATER  AT  LEAST  30  TO  60  MINUTES  BEFORE  BREAKFAST Johnson, Megan P, DO Taking  Active   meclizine (ANTIVERT) 25 MG tablet [id number] Yes Take 1 tablet (25 mg total) by mouth 3 (three) times daily as needed for dizziness. Valerie Roys, DO Taking Active            Med Note ([place], [place] May 20, 2021 11:15 AM) 05/20/21 taking 2 x a day  metFORMIN (GLUCOPHAGE) 1000 MG tablet [id number] Yes Take 1 tablet (1,000 mg total) by mouth 2 (two) times daily with a meal. Wynetta Emery, Megan P, DO Taking Active   metoprolol tartrate (LOPRESSOR) 100 MG tablet [id number] Yes Take 1 tablet (100 mg total) by mouth 2 (two) times daily. Wynetta Emery, Megan P, DO Taking Active   omeprazole (PRILOSEC) 20 MG capsule [id number] Yes Take 20 mg by mouth daily. [provider] Taking Active Self  triamterene-hydrochlorothiazide (MAXZIDE) 75-50 MG tablet [id number] Yes Take 1/2 (one-half) tablet by mouth once daily Wynetta Emery, Megan P, DO Taking Active   vitamin B-12 (CYANOCOBALAMIN) 1000 MCG tablet [id number] No Take 1 tablet (1,000 mcg total) by mouth daily.  Patient not taking: Reported on 03/30/2022   Valerie Roys, DO Not Taking Active             SDOH:  (Social Determinants of Health) assessments and interventions performed: Yes SDOH Interventions    Flowsheet Row Care Coordination from 03/30/2022 in [place] from 02/16/2022 in [place] Management from 05/26/2021 in [place] from 12/03/2020 in [place] Interventions      Food Insecurity Interventions -- Intervention Not Indicated Intervention Not Indicated Intervention Not Indicated  Housing Interventions -- Intervention Not Indicated -- Intervention Not Indicated  Transportation Interventions Intervention Not Indicated Intervention Not Indicated Intervention Not Indicated Intervention Not Indicated  Utilities Interventions -- Intervention Not Indicated -- --  Alcohol Usage Interventions --  Intervention Not Indicated (Score <7) -- --  Financial Strain Interventions Other (Comment)  [PAP] Intervention Not Indicated -- Intervention Not Indicated  Physical Activity Interventions -- Patient Refused -- Intervention Not Indicated  Stress Interventions -- Intervention Not Indicated -- Intervention Not Indicated  Social Connections Interventions -- Intervention Not Indicated -- Intervention Not Indicated       Medication Assistance:   Trulicity: -[id number]: Approved -2024: Started process March 2024  Name and location of Current pharmacy:  [facility] [address] [place] [address] Phone: [phone] Fax: [phone]  Assessment/Plan   Hypertension (BP goal <140/90) BP Readings from Last 3 Encounters:  02/20/22 105/69  11/14/21 100/64  08/05/21 118/75    Pulse Readings from Last 3 Encounters:  02/20/22 76  11/14/21 75  08/05/21 76   -Controlled -Current treatment: Metoprolol Tart 100mg  BID Query Appropriate,  -Medications previously tried: N/A  -Current home readings:  March 2024: Doesn't write down Normal: 118/76  -Current dietary habits: "Tries to eat healthy" -Current exercise habits: none -Denies hypotensive/hypertensive symptoms -Educated on BP goals and benefits of medications for prevention of heart attack, stroke and kidney damage; -Counseled to monitor BP at home daily, document, and provide log at future appointments March 2024: Will ask PCP about Metoprolol indication  Hyperlipidemia: (LDL goal < 70) The ASCVD Risk score (Arnett DK, et al., 2019) failed to calculate for the following reasons:   The patient has a prior MI or stroke diagnosis Lab Results  Component Value Date  CHOL 122 02/20/2022   CHOL 199 11/20/2013   CHOL 147 03/16/2013   Lab Results  Component Value Date   HDL 40 02/20/2022   HDL 37.20 (L) 11/20/2013   HDL 42.30 03/16/2013   Lab Results  Component Value Date    LDLCALC 66 02/20/2022   LDLCALC 132 (H) 11/20/2013   LDLCALC 88 03/16/2013   Lab Results  Component Value Date   TRIG 79 02/20/2022   TRIG 151.0 (H) 11/20/2013   TRIG 82.0 03/16/2013   Lab Results  Component Value Date   CHOLHDL 5 11/20/2013   CHOLHDL 3 03/16/2013   CHOLHDL 4 12/05/2012   Lab Results  Component Value Date   LDLDIRECT 104.5 (H) 04/24/2021   LDLDIRECT 142.6 12/05/2012   Last vitamin D Lab Results  Component Value Date   VD25OH 61.3 08/05/2021   Lab Results  Component Value Date   TSH 0.515 02/20/2022   -Controlled -Current treatment: Atorvastatin 80mg  Appropriate, Effective, Safe, Accessible ASA 81mg  Appropriate, Effective, Safe, Accessible -Medications previously tried: N/A  -Current dietary patterns: "Tries to eat healthy" -Current exercise habits: None -Educated on Cholesterol goals;  -Recommended to continue current medication   Diabetes (A1c goal <7%) Lab Results  Component Value Date   HGBA1C 6.6 (H) 02/20/2022   HGBA1C 6.6 (H) 11/14/2021   HGBA1C 5.9 (H) 08/05/2021   Lab Results  Component Value Date   MICROALBUR 150 (H) 11/14/2021   LDLCALC 66 02/20/2022   CREATININE 1.04 (H) 02/20/2022    Lab Results  Component Value Date   NA 141 02/20/2022   K 4.3 02/20/2022   CREATININE 1.04 (H) 02/20/2022   EGFR 59 (L) 02/20/2022   GFRNONAA >60 04/28/2021   GLUCOSE 135 (H) 02/20/2022    Lab Results  Component Value Date   WBC 6.0 02/20/2022   HGB 13.7 02/20/2022   HCT 41.4 02/20/2022   MCV 97 02/20/2022   PLT 164 02/20/2022    Lab Results  Component Value Date   LABMICR See below: 02/20/2022   LABMICR 51.2 02/20/2022   MICROALBUR 150 (H) 11/14/2021   MICROALBUR 30 (H) 09/30/2020  -Controlled -Current medications: Metformin 1000mg  BID Appropriate, Effective, Safe, Accessible [place] PRN Query Appropriate,  Trulicity Appropriate, Effective, Safe, Query accessible 2023: PAP Approved 2024: Will start PAP March  2024 -Medications previously tried: N/A  -Current home glucose readings fasting glucose:  -03/30/22: 93 AM. -03/29/22: 95, 220PM (Bedtime after food) 03/28/22: 89AM, 202PM 03/27/22: 91AM, 247PM -Denies hypoglycemic/hyperglycemic symptoms -Current exercise: None -Educated on A1c and blood sugar goals; -Counseled to check feet daily and get yearly eye exams March 2024: Will start Trulicity PAP. Counseled patient to write down company number and to call them a week after she brings PAP back to [place]. Tyler Aas is not on patient's medslist but per PCP note, patient will titrate off insulin once she is on Trulicity. Once PAP approved it'll be removed   Osteoporosis / Osteopenia (Goal prevent fracture) -Controlled -Last DEXA Scan:   T-Score Lumbar Spine: 10/18/20: 1.4 T-Score Femoral Neck 10/18/20: -1.9 T-Score Total Hip 10/18/20: -0.6 10-year probability of major osteoporotic fracture:  03/30/22: 11% 10-year probability of hip fracture:  03/30/22: 1.7% -Patient is not a candidate for pharmacologic treatment -Current treatment  Calcium and Vit D Appropriate, Effective, Safe, Accessible -Medications previously tried: N/A  -Recommend [id number] units of vitamin D daily. Recommend 1200 mg of calcium daily from dietary and supplemental sources. March 2024: Counseled patient to try to taper off Omeprazole if possible  CPP F/U  PRN  [place] Constable, Pharm.D. - [phone]

## 2022-03-31 ENCOUNTER — Telehealth: Payer: Self-pay

## 2022-03-31 NOTE — Progress Notes (Signed)
Care Management & Coordination Services Pharmacy Team  Reason for Encounter: Patient assistance    Patient is currently enrolled in [place] patient assistance program for the medication Trulicity. Prefill/Print/Mail out new application for patient on 04/01/22. Patient will return application back to [facility] to be signed by provider.   Ethelene Hal

## 2022-04-15 ENCOUNTER — Encounter: Payer: Self-pay | Admitting: Family Medicine

## 2022-04-16 ENCOUNTER — Telehealth: Payer: Self-pay | Admitting: Family Medicine

## 2022-04-16 MED ORDER — MECLIZINE HCL 25 MG PO TABS: 25.0000 mg | ORAL_TABLET | Freq: Three times a day (TID) | ORAL | 6 refills | Status: DC | PRN

## 2022-04-16 NOTE — Telephone Encounter (Signed)
Pt came to the office and dropped off [employer] patient assistance program form to be completed by the provider.  Placed in the providers folder in the back to be completed.

## 2022-04-24 ENCOUNTER — Encounter: Payer: Self-pay | Admitting: Family Medicine

## 2022-04-24 NOTE — Telephone Encounter (Signed)
OK to double book virutal this PM

## 2022-04-28 NOTE — Telephone Encounter (Signed)
Attempted to reach patient LVM to call office back if she is still wanting to be seen for a cold/cough as the provider stated she needed an appointment.  Also put in CRM.

## 2022-05-04 ENCOUNTER — Encounter: Payer: Self-pay | Admitting: Family Medicine

## 2022-05-05 ENCOUNTER — Telehealth: Payer: Self-pay | Admitting: Family Medicine

## 2022-05-05 DIAGNOSIS — E1142 Type 2 diabetes mellitus with diabetic polyneuropathy: Secondary | ICD-10-CM

## 2022-05-05 NOTE — Telephone Encounter (Signed)
Referral to new pharmacist placed today. Tell Janaysha Depaulo should be calling her soon.

## 2022-05-05 NOTE — Telephone Encounter (Cosign Needed)
Hello,  The care team is awaiting a fax received update on the PAP to [place] care, as this form was faxed today.  I came by this afternoon to pick up documents in my folder as I was out of office last week. We will contact the patient to let her know, and keep her posted on the processes. So sorry for any inconvenience.

## 2022-05-05 NOTE — Telephone Encounter (Signed)
Pt is calling in returning a call from [place] regarding Trulicity.

## 2022-05-05 NOTE — Telephone Encounter (Signed)
Spoke with patient. She says she has not been able to received an update in regards to the patient's assistance application she was given to by our pharmacist Artelia Laroche. Patient says she is not able to received or pick up her medication through her insurance as she is in the donut hole. Patient would like to received an update as far as her application. Please advise?

## 2022-05-07 ENCOUNTER — Telehealth: Payer: Self-pay

## 2022-05-07 NOTE — Progress Notes (Signed)
Patient was made aware that her Trulicity PAP application has been approved by [facility], and shipment will take 5 to 10 business days for delivery.   Christie Bryant

## 2022-05-07 NOTE — Progress Notes (Signed)
Care Management & Coordination Services Pharmacy Team  Reason for Encounter: Patient assistance    Patient is currently enrolled in [employer] patient assistance program for the medication Trulicity start date 05/06/22 until date: 01/12/23. Shipment will go out on 05/08/22 and will take 5-10 business days for delivery.  Called patient, unable to reach, left message to return call.  Velvet Bathe

## 2022-05-08 ENCOUNTER — Telehealth: Payer: Self-pay

## 2022-05-08 NOTE — Progress Notes (Signed)
   Care Guide Note  05/08/2022 Name: Christie Bryant MRN: [id number] DOB: [birth year]  Referred by: Dorcas Carrow, DO Reason for referral : Care Coordination (Outreach to schedule with Pharm d )   Christie Bryant is a 66 y.o. year old female who is a primary care patient of Dorcas Carrow, DO. [name] was referred to the pharmacist for assistance related to DM.    An unsuccessful telephone outreach was attempted today to contact the patient who was referred to the pharmacy team for assistance with medication management. Additional attempts will be made to contact the patient.   [name], RMA Care Guide [facility]  [place], [place] [address] Direct Dial: [phone] [email or web address]

## 2022-05-08 NOTE — Progress Notes (Signed)
   Care Guide Note  05/08/2022 Name: Christie Bryant MRN: [id number] DOB: [birth year]  Referred by: Dorcas Carrow, DO Reason for referral : Care Coordination (Outreach to schedule with Pharm d )   Christie Bryant is a 67 y.o. year old female who is a primary care patient of Dorcas Carrow, DO. Christie Bryant was referred to the pharmacist for assistance related to DM.    Successful contact was made with the patient to discuss pharmacy services including being ready for the pharmacist to call at least 5 minutes before the scheduled appointment time, to have medication bottles and any blood sugar or blood pressure readings ready for review. The patient agreed to meet with the pharmacist via with the pharmacist via telephone visit on (date/time).  05/14/2022  [name], RMA Care Guide [facility]  [place], [place] [address] Direct Dial: [phone] [email or web address]

## 2022-05-14 ENCOUNTER — Encounter: Payer: Self-pay | Admitting: Family Medicine

## 2022-05-14 ENCOUNTER — Other Ambulatory Visit: Payer: Self-pay

## 2022-05-14 NOTE — Progress Notes (Signed)
05/14/2022 Name: Christie Bryant MRN: [id number] DOB: 04-Mar-1955  Chief Complaint  Patient presents with   Medication Management   Christie Bryant is a 67 y.o. year old female who presented for a telephone visit.   They were referred to the pharmacist by their PCP for assistance in managing diabetes.   Patient is participating in a Managed Medicaid Plan:  No  Subjective:  Care Team: Primary Care Provider: Dorcas Carrow, DO ; Next Scheduled Visit: 05/21/22  Medication Access/Adherence Current Pharmacy:  [facility] [address] [place] - [address] [place] [address] Phone: [phone] Fax: [phone]  Patient reports affordability concerns with their medications: Yes  Patient reports access/transportation concerns to their pharmacy: No  Patient reports adherence concerns with their medications:  Yes  Has been without Trulicity for approximately one month now  Diabetes: Current medications: Trulicity 4.5mg  weekly, metformin 1000mg  BID -Has been using [place] on occasion while out of Trulicity if she has a snack at bedtime -Patient has been approved for Trulicity as of 4/24 but has not received yet -Has not had dose of Trulicity dose in approximately 1 month, but she does have two 3mg  pens from previous dosing on hand -Current glucose readings: FBG 67, 64, 120, 104 -Using One Touch Ultra meter; testing daily -Patient denies hypoglycemic s/sx including dizziness, shakiness, sweating initially when FBG is low; but states if she does not have juice/food soon after checking, she will feel some symptoms  -Patient denies hyperglycemic symptoms including polyuria, polydipsia, polyphagia, nocturia, neuropathy, blurred vision.  Hypertension: Current medications: metoprolol 100mg  BID, triamterene/hctz 37.5/25mg  daily -Patient has a validated, automated, upper arm home BP cuff -Current blood pressure readings readings: 110/60 -Patient reports hypotensive s/sx  including dizziness, lightheadedness. This has been consistent since stroke one year ago and may not be related to BP. -Patient denies hypertensive symptoms including headache, chest pain, shortness of breath -States metoprolol was started years ago for palpitations, but she very rarely experiences these now  Hyperlipidemia/ASCVD Risk Reduction Current lipid lowering medications: atorvastatin 80mg  daily  Antiplatelet regimen: ASA 81mg  daily   Medication Management: -Patient reports Good adherence to medications -Patient reports the following barriers to adherence: cost of Trulicity and she is already in donut hole  Objective:  -Microalbumin:Cr >30  Lab Results  Component Value Date   HGBA1C 6.6 (H) 02/20/2022   Lab Results  Component Value Date   CREATININE 1.04 (H) 02/20/2022   BUN 16 02/20/2022   NA 141 02/20/2022   K 4.3 02/20/2022   CL 105 02/20/2022   CO2 19 (L) 02/20/2022   Lab Results  Component Value Date   CHOL 122 02/20/2022   HDL 40 02/20/2022   LDLCALC 66 02/20/2022   LDLDIRECT 104.5 (H) 04/24/2021   TRIG 79 02/20/2022   CHOLHDL 5 11/20/2013   Medications Reviewed Today     Reviewed by Zettie Pho, RPH (Pharmacist) on 03/30/22 at 1328  Med List Status: <None>   Medication Order Taking? Sig Documenting Provider Last Dose Status Informant  acetaminophen (TYLENOL) 325 MG tablet [id number]  Take 2 tablets (650 mg total) by mouth every 6 (six) hours as needed for mild pain (or Fever >/= 101). Lorin Glass, MD  Active Self  aspirin 81 MG chewable tablet [id number] Yes Chew 1 tablet (81 mg total) by mouth daily. Olevia Perches P, DO Taking Active   atorvastatin (LIPITOR) 80 MG tablet [id number] Yes TAKE 1 TABLET BY MOUTH AT BEDTIME Johnson, Megan P,  DO Taking Active   Cholecalciferol (VITAMIN D-3 PO) [id number] Yes Take 2,000 Units by mouth daily.  [provider] Taking Active Self  Dulaglutide (TRULICITY) 4.5 MG/0.5ML SOPN [id number] No Inject 4.5 mg as  directed once a week.  Patient not taking: Reported on 03/30/2022   Dorcas Carrow, DO Not Taking Active   glucose blood (ONETOUCH ULTRA) test strip [id number]  1 each by Other route as needed for other. Testing 3-4x a day Johnson, Megan P, DO  Active   Insulin Pen Needle (PEN NEEDLES 3/16") 31G X 5 MM MISC [id number]  1 each by Does not apply route once a week. Olevia Perches P, DO  Active Self  levothyroxine (SYNTHROID) 125 MCG tablet [id number] Yes TAKE 1 TABLET BY MOUTH ONCE DAILY ON  AN  EMPTY  STOMACH  WITH  A  GLASS  OF  WATER  AT  LEAST  30  TO  60  MINUTES  BEFORE  BREAKFAST Johnson, Megan P, DO Taking Active   meclizine (ANTIVERT) 25 MG tablet [id number] Yes Take 1 tablet (25 mg total) by mouth 3 (three) times daily as needed for dizziness. Dorcas Carrow, DO Taking Active            Med Note [place] Pink   Tue May 20, 2021 11:15 AM) 05/20/21 taking 2 x a day  metFORMIN (GLUCOPHAGE) 1000 MG tablet [id number] Yes Take 1 tablet (1,000 mg total) by mouth 2 (two) times daily with a meal. Laural Benes, Megan P, DO Taking Active   metoprolol tartrate (LOPRESSOR) 100 MG tablet [id number] Yes Take 1 tablet (100 mg total) by mouth 2 (two) times daily. Laural Benes, Megan P, DO Taking Active   omeprazole (PRILOSEC) 20 MG capsule [id number] Yes Take 20 mg by mouth daily. [provider] Taking Active Self  triamterene-hydrochlorothiazide (MAXZIDE) 75-50 MG tablet [id number] Yes Take 1/2 (one-half) tablet by mouth once daily Laural Benes, Megan P, DO Taking Active   vitamin B-12 (CYANOCOBALAMIN) 1000 MCG tablet [id number] No Take 1 tablet (1,000 mcg total) by mouth daily.  Patient not taking: Reported on 03/30/2022   Dorcas Carrow, DO Not Taking Active            Assessment/Plan:   Diabetes: - Currently controlled - Recommend to resume Trulicity with 3mg  pens on hand at home while waiting on 4.5mg  dose to arrive, especially since she has not had a dose in 4 weeks  - [employer] to check on  shipping progress of Trulicity and will inform patient - Hold Evaristo Bury while resuming Trulicity - Recommend to check glucose daily and record - Recommend A1c in 3 months once Trulicity back on board  Hypertension: - Currently controlled - Reviewed long term cardiovascular and renal outcomes of uncontrolled blood pressure - Reviewed appropriate blood pressure monitoring technique and reviewed goal blood pressure. - Recommended to check home blood pressure and heart rate daily and record - Recommend to stop metoprolol, as this medication could mask s/sx of hypoglycemia - Recommend initiation of lisinopril 10mg  daily for BP control and renal protection with elevated microalbumin:Cr  - Will follow up with patient to monitor home BP if medications changed- may need to increase lisinopril dose - Recommend CMP in 4 weeks - Recommend propranolol 20mg  to use as needed for heart palpitations  Hyperlipidemia/ASCVD Risk Reduction: - Currently controlled.  - Recommend to continue current regimen  Medication Management: - Currently strategy sufficient to maintain appropriate adherence to prescribed medication regimen - Patient endorses that even being  in the donut hole, all medications other than Trulicity, are affordable  Follow Up Plan: Will contact patient to confirm/discuss medication orders signed once they are and will schedule routine follow-up  Lenna Gilford, PharmD, DPLA

## 2022-05-15 ENCOUNTER — Telehealth: Payer: Self-pay

## 2022-05-15 MED ORDER — PROPRANOLOL HCL 20 MG PO TABS: 20.0000 mg | ORAL_TABLET | Freq: Every day | ORAL | 0 refills | Status: DC | PRN

## 2022-05-15 MED ORDER — LISINOPRIL 10 MG PO TABS: 10.0000 mg | ORAL_TABLET | Freq: Every day | ORAL | 0 refills | Status: DC

## 2022-05-15 NOTE — Progress Notes (Signed)
   05/15/2022  Patient ID: Christie Bryant, female   DOB: 03-Jan-1956, 67 y.o.   MRN: [id number]  Patient outreach to discuss medication changes after yesterday's telephone visit and provide shipping update on Trulicity from PAP.  -Informed [name] to stop metoprolol tartrate and start lisinopril 10mg  daily.  This medication is used to lower blood pressure and provide renal protection in patients with a diabetes diagnosis   -We have prescribed propranolol 20mg  to be used if needed for heart palpitations since this is what metoprolol had originally been prescribed for.   -Check BP daily and record -Trulicity 4.5mg  was shipped yesterday via [employer], so patient should receive at the beginning of next dose -Hold Tresiba when starting the Trulicity 3mg  pens that she has at home -Check FBG daily and record  Follow-up:  Sees Dr. Laural Benes 5/9, and I will follow-up 5/17 to check on home BP and BG.  Patient has my direct number for any needs before then.  Lenna Gilford, PharmD, DPLA

## 2022-05-21 ENCOUNTER — Ambulatory Visit: Payer: PPO | Admitting: Family Medicine

## 2022-05-25 ENCOUNTER — Encounter: Payer: Self-pay | Admitting: Family Medicine

## 2022-05-26 MED ORDER — BENZONATATE 200 MG PO CAPS: 200.0000 mg | ORAL_CAPSULE | Freq: Two times a day (BID) | ORAL | 0 refills | Status: DC | PRN

## 2022-05-29 ENCOUNTER — Other Ambulatory Visit: Payer: Self-pay

## 2022-06-05 ENCOUNTER — Other Ambulatory Visit: Payer: Self-pay | Admitting: Family Medicine

## 2022-06-05 NOTE — Telephone Encounter (Signed)
Requested Prescriptions  Pending Prescriptions Disp Refills   levothyroxine (SYNTHROID) 125 MCG tablet [Pharmacy Med Name: Levothyroxine Sodium 125 MCG Oral Tablet] 90 tablet 2    Sig: TAKE 1 TABLET BY MOUTH ONCE DAILY ON  AN  EMPTY  STOMACH  WITH  A  GLASS  OF  WATER  AT  LEAST  30  TO  60  MINUTES  BEFORE  BREAKFAST     Endocrinology:  Hypothyroid Agents Passed - 06/05/2022 11:23 AM      Passed - TSH in normal range and within 360 days    TSH  Date Value Ref Range Status  02/20/2022 0.515 0.450 - 4.500 uIU/mL Final         Passed - Valid encounter within last 12 months    Recent Outpatient Visits           3 months ago Routine general medical examination at a health care facility   [facility] [place] P, DO   6 months ago Diabetic polyneuropathy associated with type 2 diabetes mellitus (HCC)   [facility] [place], Megan P, DO   10 months ago DM type 2 with diabetic peripheral neuropathy [facility])   [facility] [place], Megan P, DO   1 year ago Hypothyroidism, unspecified type   [facility] [place], Megan P, DO   1 year ago History of cerebellar stroke   [place] [facility] Dorcas Carrow, DO       Future Appointments             In 2 weeks Laural Benes, Oralia Rud, DO [place] [facility], [facility]

## 2022-06-09 ENCOUNTER — Ambulatory Visit: Payer: PPO | Admitting: Family Medicine

## 2022-06-15 ENCOUNTER — Other Ambulatory Visit: Payer: Self-pay | Admitting: Family Medicine

## 2022-06-16 NOTE — Telephone Encounter (Signed)
Requested Prescriptions  Pending Prescriptions Disp Refills   levothyroxine (SYNTHROID) 125 MCG tablet [Pharmacy Med Name: Levothyroxine Sodium 125 MCG Oral Tablet] 90 tablet 2    Sig: TAKE 1 TABLET BY MOUTH ONCE DAILY ON  AN  EMPTY  STOMACH  WITH  A  GLASS  OF  WATER  AT  LEAST  30  TO  60  MINUTES  BEFORE  BREAKFAST     Endocrinology:  Hypothyroid Agents Passed - 06/15/2022 10:43 AM      Passed - TSH in normal range and within 360 days    TSH  Date Value Ref Range Status  02/20/2022 0.515 0.450 - 4.500 uIU/mL Final         Passed - Valid encounter within last 12 months    Recent Outpatient Visits           3 months ago Routine general medical examination at a health care facility   [facility], [facility] P, DO   7 months ago Diabetic polyneuropathy associated with type 2 diabetes mellitus (HCC)   [facility] [place], Megan P, DO   10 months ago DM type 2 with diabetic peripheral neuropathy [facility])   [place] [facility] [place], Megan P, DO   1 year ago Hypothyroidism, unspecified type   [place] [facility] [place], Megan P, DO   1 year ago History of cerebellar stroke   [place] [facility] Dorcas Carrow, DO       Future Appointments             In 1 week Laural Benes, Oralia Rud, DO [place] [facility], [facility]

## 2022-06-23 ENCOUNTER — Ambulatory Visit (INDEPENDENT_AMBULATORY_CARE_PROVIDER_SITE_OTHER): Payer: PPO | Admitting: Family Medicine

## 2022-06-23 ENCOUNTER — Encounter: Payer: Self-pay | Admitting: Family Medicine

## 2022-06-23 VITALS — BP 108/67 | HR 86 | Temp 98.2°F | Wt 208.2 lb

## 2022-06-23 DIAGNOSIS — E1142 Type 2 diabetes mellitus with diabetic polyneuropathy: Secondary | ICD-10-CM

## 2022-06-23 DIAGNOSIS — Z7984 Long term (current) use of oral hypoglycemic drugs: Secondary | ICD-10-CM

## 2022-06-23 DIAGNOSIS — I10 Essential (primary) hypertension: Secondary | ICD-10-CM

## 2022-06-23 LAB — BAYER DCA HB A1C WAIVED: HB A1C (BAYER DCA - WAIVED): 6 % — ABNORMAL HIGH (ref 4.8–5.6)

## 2022-06-23 MED ORDER — HYDROCHLOROTHIAZIDE 25 MG PO TABS: 25.0000 mg | ORAL_TABLET | Freq: Every day | ORAL | 3 refills | Status: DC

## 2022-06-23 NOTE — Assessment & Plan Note (Signed)
Over treated. Will stop her triamterene and continue HCTZ, metoprolol and lisinopril. Will recheck in about 2 months. Call with any concerns.

## 2022-06-23 NOTE — Progress Notes (Signed)
BP 108/67   Pulse 86   Temp 98.2 F (36.8 C) (Oral)   Wt 208 lb 3.2 oz (94.4 kg)   SpO2 94%   BMI 29.87 kg/m    Subjective:    Patient ID: Christie Bryant, female    DOB: 08/25/1955, 67 y.o.   MRN: [id number]  HPI: Christie Bryant is a 67 y.o. female  Chief Complaint  Patient presents with   Diabetes    Patient has an upcoming appointment scheduled for a Diabetic Eye Exam.    DIABETES Hypoglycemic episodes:no Polydipsia/polyuria: no Visual disturbance: no Chest pain: no Paresthesias: no Glucose Monitoring: yes  Accucheck frequency: Daily Taking Insulin?: no Blood Pressure Monitoring: not checking Retinal Examination: Not up to Date Foot Exam: Up to Date Diabetic Education: Completed Pneumovax: Up to Date Influenza: Up to Date Aspirin: yes  Relevant past medical, surgical, family and social history reviewed and updated as indicated. Interim medical history since our last visit reviewed. Allergies and medications reviewed and updated.  Review of Systems  Constitutional: Negative.   Respiratory: Negative.    Cardiovascular: Negative.   Gastrointestinal: Negative.   Musculoskeletal: Negative.   Neurological: Negative.   Psychiatric/Behavioral: Negative.      Per HPI unless specifically indicated above     Objective:    BP 108/67   Pulse 86   Temp 98.2 F (36.8 C) (Oral)   Wt 208 lb 3.2 oz (94.4 kg)   SpO2 94%   BMI 29.87 kg/m   Wt Readings from Last 3 Encounters:  06/23/22 208 lb 3.2 oz (94.4 kg)  02/20/22 211 lb 6.4 oz (95.9 kg)  02/16/22 205 lb (93 kg)    Physical Exam Vitals and nursing note reviewed.  Constitutional:      General: She is not in acute distress.    Appearance: Normal appearance. She is not ill-appearing, toxic-appearing or diaphoretic.  HENT:     Head: Normocephalic and atraumatic.     Right Ear: External ear normal.     Left Ear: External ear normal.     Nose: Nose normal.     Mouth/Throat:     Mouth: Mucous membranes are  moist.     Pharynx: Oropharynx is clear.  Eyes:     General: No scleral icterus.       Right eye: No discharge.        Left eye: No discharge.     Extraocular Movements: Extraocular movements intact.     Conjunctiva/sclera: Conjunctivae normal.     Pupils: Pupils are equal, round, and reactive to light.  Cardiovascular:     Rate and Rhythm: Normal rate and regular rhythm.     Pulses: Normal pulses.     Heart sounds: Normal heart sounds. No murmur heard.    No friction rub. No gallop.  Pulmonary:     Effort: Pulmonary effort is normal. No respiratory distress.     Breath sounds: Normal breath sounds. No stridor. No wheezing, rhonchi or rales.  Chest:     Chest wall: No tenderness.  Musculoskeletal:        General: Normal range of motion.     Cervical back: Normal range of motion and neck supple.  Skin:    General: Skin is warm and dry.     Capillary Refill: Capillary refill takes less than 2 seconds.     Coloration: Skin is not jaundiced or pale.     Findings: No bruising, erythema, lesion or rash.  Neurological:  General: No focal deficit present.     Mental Status: She is alert and oriented to person, place, and time. Mental status is at baseline.  Psychiatric:        Mood and Affect: Mood normal.        Behavior: Behavior normal.        Thought Content: Thought content normal.        Judgment: Judgment normal.     Results for orders placed or performed in visit on 06/23/22  Bayer DCA Hb A1c Waived  Result Value Ref Range   HB A1C (BAYER DCA - WAIVED) 6.0 (H) 4.8 - 5.6 %      Assessment & Plan:   Problem List Items Addressed This Visit       Cardiovascular and Mediastinum   Hypertension (Chronic)    Over treated. Will stop her triamterene and continue HCTZ, metoprolol and lisinopril. Will recheck in about 2 months. Call with any concerns.       Relevant Medications   hydrochlorothiazide (HYDRODIURIL) 25 MG tablet     Endocrine   DM type 2 with diabetic  peripheral neuropathy (HCC) - Primary (Chronic)    Doing great with A1c of 6.0. Tolerating her trulicity well. Would like to lose a bit of weight. Will work with pharmacy to see about getting her switched to ozempic. Continue trulicity until she runs out. Recheck 3 months. Call with any concerns.       Relevant Orders   Bayer DCA Hb A1c Waived (Completed)     Follow up plan: Return in about 2 months (around 08/23/2022).

## 2022-06-23 NOTE — Assessment & Plan Note (Signed)
Doing great with A1c of 6.0. Tolerating her trulicity well. Would like to lose a bit of weight. Will work with pharmacy to see about getting her switched to ozempic. Continue trulicity until she runs out. Recheck 3 months. Call with any concerns.

## 2022-07-06 ENCOUNTER — Other Ambulatory Visit: Payer: Self-pay

## 2022-07-07 NOTE — Progress Notes (Signed)
   07/07/2022  Patient ID: Christie Bryant, female   DOB: July 30, 1955, 67 y.o.   MRN: [id number]  S/O: Telephone visit to follow-up on home BG and BP control  DM -Current medications:  Trulicity 4.5mg  weekly (PAP) -Patient states FBG 75-90, evening BG 150-225 after eating -A1c 6.0 on 6/11 -Does not endorse s/sx of hypoglycemia unless BG <70, which rarely occurs -Patient has 1 month of Trulicity remaining on hand and states Dr. Laural Benes mentioned changing to Ozempic at her last visit  HTN -Current medications:  hydrochlorothiazide 25mg  daily, lisinopril 10mg  daily, propranolol 20mg  as needed -Triamterene was recently discontinued due to hypotension and lethargy; hydrochlorothiazide component continued at 25mg  daily -Patient states she is checking BP regularly at home and it is "normal" -Has not needed to take propranolol for palpitations since it was prescribed in May  A/P:  DM -Currently controlled -It appears Dr. Laural Benes is considering changing Trulicity to Ozempic for further weight loss benefit- will consult with her and assist with change and PAP if needed  HTN -Currently controlled -Continue daily monitoring/recording and regular follow-up with PCP  Follow-up:  Based on Dr. Henriette Combs preference, will initiate PAP application process for Ozempic and assist in transition patient from Trulicity.Lenna Gilford, PharmD, DPLA

## 2022-07-08 NOTE — Progress Notes (Signed)
   07/08/2022  Patient ID: Christie Bryant, female   DOB: [birth year], 67 y.o.   MRN: [id number]  Consulted Dr. Laural Benes, and she would like to change Ms. Borgeson to Ozempic 2mg  weekly and pursue PAP.  Coordinating with medication assistance team to initiate PAP application process.  Lenna Gilford, PharmD, DPLA

## 2022-07-09 ENCOUNTER — Telehealth: Payer: Self-pay

## 2022-07-09 NOTE — Telephone Encounter (Signed)
-----   Message from Lenna Gilford, [facility] sent at 07/08/2022  6:03 PM EDT ----- Good evening!  [name] has previously been on Trulicity with the [facility] PAP, but Dr. Laural Benes would like to change her to Ozempic 2mg  weekly.  She would qualify for PAP through [employer]; would you all mind to initiate this application process for her, please?  Thank you!  Lenna Gilford, PharmD, DPLA

## 2022-07-09 NOTE — Telephone Encounter (Signed)
Submitted application E-FILE  for OZEMPIC to [employer] for patient assistance PROCESSING.   Phone: [phone]  PLEASE BE ADVISED  Melanee Spry CPhT Rx Patient Advocate [phone]

## 2022-07-15 NOTE — Telephone Encounter (Signed)
Received notification from [employer] regarding approval for OZEMPIC. Patient assistance approved from 07/13/2022 to 07/08/2023.  Phone: [phone]  PLEASE BE ADVISED SCANNED IN LETTER OF APPROVAL TO MEDIA OF CHART...  I WILL SEND MYCHART MESSAGE TO LET PT KNOW THAT SHE CAN CALL  THE NUMBER ABOVE ABOUT SHIPPING INFORMATION

## 2022-07-17 ENCOUNTER — Other Ambulatory Visit (HOSPITAL_COMMUNITY): Payer: Self-pay

## 2022-07-20 ENCOUNTER — Telehealth: Payer: Self-pay

## 2022-07-20 NOTE — Progress Notes (Signed)
   07/20/2022  Patient ID: Christie Bryant, female   DOB: August 26, 1955, 67 y.o.   MRN: [id number]  Patient outreach to inform Ozempic 2mg  PAP has been approved and discuss transition from Trulicity.  Shipment should arrive to Dr. Henriette Combs office in 10-14 business days, so I want to make sure she has enough Trulicity until then.  We will also need to discuss current blood glucose readings to determine if metformin dose may need to be decreased when Ozempic 2mg  weekly is started.  I was not able to reach [name] but did leave HIPAA compliant voicemail and am sending MyChart message.    Lenna Gilford, PharmD, DPLA

## 2022-07-28 ENCOUNTER — Telehealth: Payer: Self-pay | Admitting: Family Medicine

## 2022-07-28 NOTE — Telephone Encounter (Signed)
Pt is calling in to see if her OZEMPIC had arrived at the office. Please follow up with pt.

## 2022-07-28 NOTE — Telephone Encounter (Signed)
Patient has called back to check the status of her OZEMPIC shipment, per Iris, patient will be called when this arrives. Patient notified that she will receive a call when her OZEMPIC shipment comes in.

## 2022-08-03 ENCOUNTER — Telehealth: Payer: Self-pay | Admitting: Family Medicine

## 2022-08-03 NOTE — Telephone Encounter (Signed)
Noted  

## 2022-08-03 NOTE — Telephone Encounter (Unsigned)
Copied from CRM [id number]. Topic: General - Inquiry >> Aug 03, 2022  2:02 PM Marlow Baars wrote: Reason for CRM: Tammy with [employer] patient assistance program called in to let the provider know the patients Ozempic has been shipped and should be here within 2 business days. The tracking number [phone] with [employer]!

## 2022-08-04 ENCOUNTER — Other Ambulatory Visit: Payer: Self-pay | Admitting: Family Medicine

## 2022-08-04 ENCOUNTER — Telehealth: Payer: Self-pay

## 2022-08-04 NOTE — Telephone Encounter (Signed)
4 boxes of Ozempic received for the patient. Called and LVM notifying patient that the medication was ready to be picked up.

## 2022-08-05 NOTE — Telephone Encounter (Signed)
Requested medication (s) are due for refill today: yes  Requested medication (s) are on the active medication list: yes  Last refill:  05/15/22 #90 0 refills  Future visit scheduled: yes in 2 weeks   Notes to clinic:  protocol failed last labs 02/20/22 no refills remain. Do you want to refill Rx?     Requested Prescriptions  Pending Prescriptions Disp Refills   lisinopril (ZESTRIL) 10 MG tablet [Pharmacy Med Name: Lisinopril 10 MG Oral Tablet] 90 tablet 0    Sig: Take 1 tablet by mouth once daily     Cardiovascular:  ACE Inhibitors Failed - 08/05/2022  1:33 PM      Failed - Cr in normal range and within 180 days    Creatinine, Ser  Date Value Ref Range Status  02/20/2022 1.04 (H) 0.57 - 1.00 mg/dL Final   Creatinine,U  Date Value Ref Range Status  11/20/2013 75.8 mg/dL Final         Passed - K in normal range and within 180 days    Potassium  Date Value Ref Range Status  02/20/2022 4.3 3.5 - 5.2 mmol/L Final         Passed - Patient is not pregnant      Passed - Last BP in normal range    BP Readings from Last 1 Encounters:  06/23/22 108/67         Passed - Valid encounter within last 6 months    Recent Outpatient Visits           1 month ago DM type 2 with diabetic peripheral neuropathy (HCC)   [facility] [place], Megan P, DO   5 months ago Routine general medical examination at a health care facility   [facility], [place] P, DO   8 months ago Diabetic polyneuropathy associated with type 2 diabetes mellitus [facility])   [place] [facility] [place], Megan P, DO   1 year ago DM type 2 with diabetic peripheral neuropathy [facility])   [place] [facility] [place], Megan P, DO   1 year ago Hypothyroidism, unspecified type   [facility] Dorcas Carrow, DO       Future Appointments             In 2 weeks Laural Benes, Oralia Rud, DO  [facility], [facility]

## 2022-08-05 NOTE — Transitions of Care (Post Inpatient/ED Visit) (Unsigned)
   08/05/2022  Name: Christie Bryant MRN: [id number] DOB: 22-Dec-1955  {[id number]}

## 2022-08-05 NOTE — Telephone Encounter (Signed)
Called and notified patient that her medication was here and ready to be picked up.

## 2022-08-06 NOTE — Telephone Encounter (Signed)
Medication picked up by the patient.  

## 2022-08-10 ENCOUNTER — Telehealth: Payer: PPO | Admitting: Physician Assistant

## 2022-08-10 ENCOUNTER — Ambulatory Visit: Payer: Self-pay

## 2022-08-10 DIAGNOSIS — U071 COVID-19: Secondary | ICD-10-CM

## 2022-08-10 MED ORDER — NIRMATRELVIR/RITONAVIR (PAXLOVID) TABLET (RENAL DOSING)
2.0000 | ORAL_TABLET | Freq: Two times a day (BID) | ORAL | 0 refills | Status: AC
Start: 2022-08-10 — End: 2022-08-15

## 2022-08-10 MED ORDER — BENZONATATE 100 MG PO CAPS
100.0000 mg | ORAL_CAPSULE | Freq: Three times a day (TID) | ORAL | 0 refills | Status: DC | PRN
Start: 2022-08-10 — End: 2023-03-26

## 2022-08-10 NOTE — Patient Instructions (Signed)
Christie Bryant, thank you for joining [name], PA-C for today's virtual visit.  While this provider is not your primary care provider (PCP), if your PCP is located in our provider database this encounter information will be shared with them immediately following your visit.   A [facility] MyChart account gives you access to today's visit and all your visits, tests, and labs performed at [facility] " click here if you don't have a [facility] MyChart account or go to [email or web address]  Consent: (Patient) Christie Bryant provided verbal consent for this virtual visit at the beginning of the encounter.  Current Medications:  Current Outpatient Medications:    acetaminophen (TYLENOL) 325 MG tablet, Take 2 tablets (650 mg total) by mouth every 6 (six) hours as needed for mild pain (or Fever >/= 101)., Disp: , Rfl:    aspirin 81 MG chewable tablet, Chew 1 tablet (81 mg total) by mouth daily., Disp: , Rfl:    atorvastatin (LIPITOR) 80 MG tablet, TAKE 1 TABLET BY MOUTH AT BEDTIME, Disp: 90 tablet, Rfl: 3   benzonatate (TESSALON) 100 MG capsule, Take 1 capsule (100 mg total) by mouth 3 (three) times daily as needed., Disp: 30 capsule, Rfl: 0   Cholecalciferol (VITAMIN D-3 PO), Take 2,000 Units by mouth daily. , Disp: , Rfl:    Dulaglutide (TRULICITY) 4.5 MG/0.5ML SOPN, Inject 4.5 mg as directed once a week., Disp: 6 mL, Rfl: 3   glucose blood (ONETOUCH ULTRA) test strip, 1 each by Other route as needed for other. Testing 3-4x a day, Disp: 1200 each, Rfl: 12   hydrochlorothiazide (HYDRODIURIL) 25 MG tablet, Take 1 tablet (25 mg total) by mouth daily., Disp: 30 tablet, Rfl: 3   Insulin Pen Needle (PEN NEEDLES 3/16") 31G X 5 MM MISC, 1 each by Does not apply route once a week., Disp: 100 each, Rfl: 12   levothyroxine (SYNTHROID) 125 MCG tablet, TAKE 1 TABLET BY MOUTH ONCE DAILY ON  AN  EMPTY  STOMACH  WITH  A  GLASS  OF  WATER  AT  LEAST  30  TO  60  MINUTES  BEFORE  BREAKFAST,  Disp: 90 tablet, Rfl: 2   lisinopril (ZESTRIL) 10 MG tablet, Take 1 tablet by mouth once daily, Disp: 90 tablet, Rfl: 0   meclizine (ANTIVERT) 25 MG tablet, Take 1 tablet (25 mg total) by mouth 3 (three) times daily as needed for dizziness., Disp: 90 tablet, Rfl: 6   metFORMIN (GLUCOPHAGE) 1000 MG tablet, Take 1 tablet (1,000 mg total) by mouth 2 (two) times daily with a meal., Disp: 180 tablet, Rfl: 1   nirmatrelvir/ritonavir, renal dosing, (PAXLOVID) 10 x 150 MG & 10 x 100MG  TABS, Take 2 tablets by mouth 2 (two) times daily for 5 days. (Take nirmatrelvir 150 mg one tablet twice daily for 5 days and ritonavir 100 mg one tablet twice daily for 5 days) Patient GFR is 59, Disp: 20 tablet, Rfl: 0   omeprazole (PRILOSEC) 20 MG capsule, Take 20 mg by mouth daily., Disp: , Rfl:    propranolol (INDERAL) 20 MG tablet, Take 1 tablet (20 mg total) by mouth daily as needed (for heart palpitations)., Disp: 30 tablet, Rfl: 0   Medications ordered in this encounter:  Meds ordered this encounter  Medications   nirmatrelvir/ritonavir, renal dosing, (PAXLOVID) 10 x 150 MG & 10 x 100MG  TABS    Sig: Take 2 tablets by mouth 2 (two) times daily for 5 days. (Take nirmatrelvir 150 mg  one tablet twice daily for 5 days and ritonavir 100 mg one tablet twice daily for 5 days) Patient GFR is 59    Dispense:  20 tablet    Refill:  0    Order Specific Question:   Supervising Provider    Answer:   Merrilee Jansky [[id number]]   benzonatate (TESSALON) 100 MG capsule    Sig: Take 1 capsule (100 mg total) by mouth 3 (three) times daily as needed.    Dispense:  30 capsule    Refill:  0    Order Specific Question:   Supervising Provider    Answer:   Merrilee Jansky [id number]     *If you need refills on other medications prior to your next appointment, please contact your pharmacy*  Follow-Up: Call back or seek an in-person evaluation if the symptoms worsen or if the condition fails to improve as anticipated.  [facility]  Virtual Care [phone]  Care Instructions: Paxlovid (Nirmatrelvir; Ritonavir) Tablets What is this medication? NIRMATRELVIR; RITONAVIR (NIR ma TREL vir; ri TOE na veer) treats mild to moderate COVID-19. It may help people who are at high risk of developing severe illness. It works by limiting the spread of the virus in your body. This medicine may be used for other purposes; ask your health care provider or pharmacist if you have questions. COMMON BRAND NAME(S): PAXLOVID What should I tell my care team before I take this medication? They need to know if you have any of these conditions: Any allergies Any serious illness Kidney disease Liver disease An unusual or allergic reaction to nirmatrelvir, ritonavir, other medications, foods, dyes, or preservatives Pregnant or trying to get pregnant Breast-feeding How should I use this medication? This product contains 2 different medications that are packaged together. For the standard dose, take 2 pink tablets of nirmatrelvir with 1 white tablet of ritonavir (3 tablets total) by mouth with water twice daily. Talk to your care team if you have kidney disease. You may need a different dose. Swallow the tablets whole. You can take it with or without food. If it upsets your stomach, take it with food. Take all of this medication unless your care team tells you to stop it early. Keep taking it even if you think you are better. Talk to your care team about the use of this medication in children. While it may be prescribed for children as young as 12 years for selected conditions, precautions do apply. Overdosage: If you think you have taken too much of this medicine contact a poison control center or emergency room at once. NOTE: This medicine is only for you. Do not share this medicine with others. What if I miss a dose? If you miss a dose, take it as soon as you can unless it is more than 8 hours late. If it is more than 8 hours late, skip the missed  dose. Take the next dose at the normal time. Do not take extra or 2 doses at the same time to make up for the missed dose. What may interact with this medication? Do not take this medication with any of the following: Alfuzosin Certain medications for anxiety or sleep, such as midazolam or triazolam Certain medications for cancer, such as apalutamide Certain medications for cholesterol, such as lovastatin or simvastatin Certain medications for irregular heartbeat, such as amiodarone, dronedarone, flecainide, propafenone, quinidine Certain medications for mental health conditions, such as lurasidone or pimozide Certain medications for seizures, such as carbamazepine, phenobarbital,  phenytoin, primidone Colchicine Eletriptan Eplerenone Ergot alkaloids, such as dihydroergotamine, ergotamine, methylergonovine Finerenone Flibanserin Ivabradine Lomitapide Lumacaftor; ivacaftor Naloxegol Ranolazine Red Yeast Rice Rifampin Rifapentine Sildenafil Silodosin St. John's wort Tolvaptan Ubrogepant Voclosporin This medication may affect how other medications work, and other medications may affect the way this medication works. Talk with your care team about all of the medications you take. They may suggest changes to your treatment plan to lower the risk of side effects and to make sure your medications work as intended. This list may not describe all possible interactions. Give your health care provider a list of all the medicines, herbs, non-prescription drugs, or dietary supplements you use. Also tell them if you smoke, drink alcohol, or use illegal drugs. Some items may interact with your medicine. What should I watch for while using this medication? Your condition will be monitored carefully while you are receiving this medication. Visit your care team for regular checkups. Tell your care team if your symptoms do not start to get better or if they get worse. If you have untreated HIV  infection, this medication may lead to some HIV medications not working as well in the future. Estrogen and progestin hormones may not work as well while you are taking this medication. Your care team can help you find the contraceptive option that works for you. What side effects may I notice from receiving this medication? Side effects that you should report to your care team as soon as possible: Allergic reactions--skin rash, itching, hives, swelling of the face, lips, tongue, or throat Liver injury--right upper belly pain, loss of appetite, nausea, light-colored stool, dark yellow or brown urine, yellowing skin or eyes, unusual weakness or fatigue Redness, blistering, peeling, or loosening of the skin, including inside the mouth Side effects that usually do not require medical attention (report these to your care team if they continue or are bothersome): Change in taste Diarrhea General discomfort and fatigue Increase in blood pressure Muscle pain Nausea Stomach pain This list may not describe all possible side effects. Call your doctor for medical advice about side effects. You may report side effects to FDA at 1-800-FDA-1088. Where should I keep my medication? Keep out of the reach of children and pets. Store at room temperature between 20 and 25 degrees C (68 and 77 degrees F). Get rid of any unused medication after the expiration date. To get rid of medications that are no longer needed or have expired: Take the medication to a medication take-back program. Check with your pharmacy or law enforcement to find a location. If you cannot return the medication, check the label or package insert to see if the medication should be thrown out in the garbage or flushed down the toilet. If you are not sure, ask your care team. If it is safe to put it in the trash, take the medication out of the container. Mix the medication with cat litter, dirt, coffee grounds, or other unwanted substance. Seal  the mixture in a bag or container. Put it in the trash. NOTE: This sheet is a summary. It may not cover all possible information. If you have questions about this medicine, talk to your doctor, pharmacist, or health care provider.  2024 Elsevier/Gold Standard (2022-02-16 00:00:00)    Isolation Instructions: You are to isolate at home until you have been fever free for at least 24 hours without a fever-reducing medication, and symptoms have been steadily improving for 24 hours. At that time,  you can end isolation  but need to mask for an additional 5 days.   If you must be around other household members who do not have symptoms, you need to make sure that both you and the family members are masking consistently with a high-quality mask.  If you note any worsening of symptoms despite treatment, please seek an in-person evaluation ASAP. If you note any significant shortness of breath or any chest pain, please seek ER evaluation. Please do not delay care!   COVID-19: What to Do if You Are Sick If you test positive and are an older adult or someone who is at high risk of getting very sick from COVID-19, treatment may be available. Contact a healthcare provider right away after a positive test to determine if you are eligible, even if your symptoms are mild right now. You can also visit a Test to Treat location and, if eligible, receive a prescription from a provider. Don't delay: Treatment must be started within the first few days to be effective. If you have a fever, cough, or other symptoms, you might have COVID-19. Most people have mild illness and are able to recover at home. If you are sick: Keep track of your symptoms. If you have an emergency warning sign (including trouble breathing), call 911. Steps to help prevent the spread of COVID-19 if you are sick If you are sick with COVID-19 or think you might have COVID-19, follow the steps below to care for yourself and to help protect other people  in your home and community. Stay home except to get medical care Stay home. Most people with COVID-19 have mild illness and can recover at home without medical care. Do not leave your home, except to get medical care. Do not visit public areas and do not go to places where you are unable to wear a mask. Take care of yourself. Get rest and stay hydrated. Take over-the-counter medicines, such as acetaminophen, to help you feel better. Stay in touch with your doctor. Call before you get medical care. Be sure to get care if you have trouble breathing, or have any other emergency warning signs, or if you think it is an emergency. Avoid public transportation, ride-sharing, or taxis if possible. Get tested If you have symptoms of COVID-19, get tested. While waiting for test results, stay away from others, including staying apart from those living in your household. Get tested as soon as possible after your symptoms start. Treatments may be available for people with COVID-19 who are at risk for becoming very sick. Don't delay: Treatment must be started early to be effective--some treatments must begin within 5 days of your first symptoms. Contact your healthcare provider right away if your test result is positive to determine if you are eligible. Self-tests are one of several options for testing for the virus that causes COVID-19 and may be more convenient than laboratory-based tests and point-of-care tests. Ask your healthcare provider or your local health department if you need help interpreting your test results. You can visit your state, tribal, local, and territorial health department's website to look for the latest local information on testing sites. Separate yourself from other people As much as possible, stay in a specific room and away from other people and pets in your home. If possible, you should use a separate bathroom. If you need to be around other people or animals in or outside of the home,  wear a well-fitting mask. Tell your close contacts that they may have been exposed to  COVID-19. An infected person can spread COVID-19 starting 48 hours (or 2 days) before the person has any symptoms or tests positive. By letting your close contacts know they may have been exposed to COVID-19, you are helping to protect everyone. See COVID-19 and Animals if you have questions about pets. If you are diagnosed with COVID-19, someone from the health department may call you. Answer the call to slow the spread. Monitor your symptoms Symptoms of COVID-19 include fever, cough, or other symptoms. Follow care instructions from your healthcare provider and local health department. Your local health authorities may give instructions on checking your symptoms and reporting information. When to seek emergency medical attention Look for emergency warning signs* for COVID-19. If someone is showing any of these signs, seek emergency medical care immediately: Trouble breathing Persistent pain or pressure in the chest New confusion Inability to wake or stay awake Pale, gray, or blue-colored skin, lips, or nail beds, depending on skin tone *This list is not all possible symptoms. Please call your medical provider for any other symptoms that are severe or concerning to you. Call 911 or call ahead to your local emergency facility: Notify the operator that you are seeking care for someone who has or may have COVID-19. Call ahead before visiting your doctor Call ahead. Many medical visits for routine care are being postponed or done by phone or telemedicine. If you have a medical appointment that cannot be postponed, call your doctor's office, and tell them you have or may have COVID-19. This will help the office protect themselves and other patients. If you are sick, wear a well-fitting mask You should wear a mask if you must be around other people or animals, including pets (even at home). Wear a mask with the best  fit, protection, and comfort for you. You don't need to wear the mask if you are alone. If you can't put on a mask (because of trouble breathing, for example), cover your coughs and sneezes in some other way. Try to stay at least 6 feet away from other people. This will help protect the people around you. Masks should not be placed on young children under age 28 years, anyone who has trouble breathing, or anyone who is not able to remove the mask without help. Cover your coughs and sneezes Cover your mouth and nose with a tissue when you cough or sneeze. Throw away used tissues in a lined trash can. Immediately wash your hands with soap and water for at least 20 seconds. If soap and water are not available, clean your hands with an alcohol-based hand sanitizer that contains at least 60% alcohol. Clean your hands often Wash your hands often with soap and water for at least 20 seconds. This is especially important after blowing your nose, coughing, or sneezing; going to the bathroom; and before eating or preparing food. Use hand sanitizer if soap and water are not available. Use an alcohol-based hand sanitizer with at least 60% alcohol, covering all surfaces of your hands and rubbing them together until they feel dry. Soap and water are the best option, especially if hands are visibly dirty. Avoid touching your eyes, nose, and mouth with unwashed hands. Handwashing Tips Avoid sharing personal household items Do not share dishes, drinking glasses, cups, eating utensils, towels, or bedding with other people in your home. Wash these items thoroughly after using them with soap and water or put in the dishwasher. Clean surfaces in your home regularly Clean and disinfect high-touch surfaces (for  example, doorknobs, tables, handles, light switches, and countertops) in your "sick room" and bathroom. In shared spaces, you should clean and disinfect surfaces and items after each use by the person who is ill. If  you are sick and cannot clean, a caregiver or other person should only clean and disinfect the area around you (such as your bedroom and bathroom) on an as needed basis. Your caregiver/other person should wait as long as possible (at least several hours) and wear a mask before entering, cleaning, and disinfecting shared spaces that you use. Clean and disinfect areas that may have blood, stool, or body fluids on them. Use household cleaners and disinfectants. Clean visible dirty surfaces with household cleaners containing soap or detergent. Then, use a household disinfectant. Use a product from [employer] List N: Disinfectants for Coronavirus (COVID-19). Be sure to follow the instructions on the label to ensure safe and effective use of the product. Many products recommend keeping the surface wet with a disinfectant for a certain period of time (look at "contact time" on the product label). You may also need to wear personal protective equipment, such as gloves, depending on the directions on the product label. Immediately after disinfecting, wash your hands with soap and water for 20 seconds. For completed guidance on cleaning and disinfecting your home, visit Complete Disinfection Guidance. Take steps to improve ventilation at home Improve ventilation (air flow) at home to help prevent from spreading COVID-19 to other people in your household. Clear out COVID-19 virus particles in the air by opening windows, using air filters, and turning on fans in your home. Use this interactive tool to learn how to improve air flow in your home. When you can be around others after being sick with COVID-19 Deciding when you can be around others is different for different situations. Find out when you can safely end home isolation. For any additional questions about your care, contact your healthcare provider or state or local health department. 04/02/2020 Content source: [facility] for Immunization and Respiratory  Diseases ([facility]), Division of Viral Diseases This information is not intended to replace advice given to you by your health care provider. Make sure you discuss any questions you have with your health care provider. Document Revised: 05/16/2020 Document Reviewed: 05/16/2020 Elsevier Patient Education  2022 [employer].     If you have been instructed to have an in-person evaluation today at a local Urgent Care facility, please use the link below. It will take you to a list of all of our available [facility], including address, phone number and hours of operation. Please do not delay care.  [facility]  If you or a family member do not have a primary care provider, use the link below to schedule a visit and establish care. When you choose a [facility] primary care physician or advanced practice provider, you gain a long-term partner in health. Find a Primary Care Provider  Learn more about [facility]'s in-office and virtual care options: [facility] - Get Care Now

## 2022-08-10 NOTE — Telephone Encounter (Signed)
Chief Complaint: Covid-19 Symptoms: Cough, sore throat, chills Frequency: onset Yesterday and has gotten worse Pertinent Negatives: Patient denies SOB, chest pain Disposition: [] ED /[] Urgent Care (no appt availability in office) / [] Appointment(In office/virtual)/ [x]  [facility]/ [] Home Care/ [] Refused Recommended Disposition /[] [facility]/ []  Follow-up with PCP Additional Notes: Patient states she has tested positive for covid-19 this morning. Patient reports her husband was in the hospital and also tested positive. She would like to have medication sent to her pharmacy. Advised patient that she would need to be evaluated. No appointments available in the office. Patient has been scheduled for a virtual UC appointment today at 1815.   Summary: positive for covid, covid today, sorfe throat, cough, cold and flu symptoms   Patient tested positive for covid today, sore throat, cough, cold and flu symptoms, thinks she had small fever last night. She is asking for med to be sent instead of appt. [facility] [place], [place] - [address] Phone: [phone] Fax: [phone]     Reason for Disposition  [1] HIGH RISK patient (e.g., weak immune system, age > 64 years, obesity with BMI 30 or higher, pregnant, chronic lung disease or other chronic medical condition) AND [2] COVID symptoms (e.g., cough, fever)  (Exceptions: Already seen by PCP and no new or worsening symptoms.)  Answer Assessment - Initial Assessment Questions 1. COVID-19 DIAGNOSIS: "How do you know that you have COVID?" (e.g., positive lab test or self-test, diagnosed by doctor or NP/PA, symptoms after exposure).     Today home test  2. COVID-19 EXPOSURE: "Was there any known exposure to COVID before the symptoms began?" CDC Definition of close contact: within 6 feet (2 meters) for a total of 15 minutes or more over a 24-hour period.      Husband had it first  3. ONSET: "When did the COVID-19  symptoms start?"      yesterday 4. WORST SYMPTOM: "What is your worst symptom?" (e.g., cough, fever, shortness of breath, muscle aches)     Cough 5. COUGH: "Do you have a cough?" If Yes, ask: "How bad is the cough?"       severe 6. FEVER: "Do you have a fever?" If Yes, ask: "What is your temperature, how was it measured, and when did it start?"     No I don't have anything to check it with  7. RESPIRATORY STATUS: "Describe your breathing?" (e.g., normal; shortness of breath, wheezing, unable to speak)      Normal 8. BETTER-SAME-WORSE: "Are you getting better, staying the same or getting worse compared to yesterday?"  If getting worse, ask, "In what way?"     Worse  9. OTHER SYMPTOMS: "Do you have any other symptoms?"  (e.g., chills, fatigue, headache, loss of smell or taste, muscle pain, sore throat)     Sore throat, cough, headache,  10. HIGH RISK DISEASE: "Do you have any chronic medical problems?" (e.g., asthma, heart or lung disease, weak immune system, obesity, etc.)       No 11. VACCINE: "Have you had the COVID-19 vaccine?" If Yes, ask: "Which one, how many shots, when did you get it?"       Yes, 4 vaccines  Protocols used: Coronavirus (COVID-19) Diagnosed or Suspected-A-AH

## 2022-08-10 NOTE — Progress Notes (Signed)
Virtual Visit Consent   Christie Bryant, you are scheduled for a virtual visit with a [facility] provider today. Just as with appointments in the office, your consent must be obtained to participate. Your consent will be active for this visit and any virtual visit you may have with one of our providers in the next 365 days. If you have a MyChart account, a copy of this consent can be sent to you electronically.  As this is a virtual visit, video technology does not allow for your provider to perform a traditional examination. This may limit your provider's ability to fully assess your condition. If your provider identifies any concerns that need to be evaluated in person or the need to arrange testing (such as labs, EKG, etc.), we will make arrangements to do so. Although advances in technology are sophisticated, we cannot ensure that it will always work on either your end or our end. If the connection with a video visit is poor, the visit may have to be switched to a telephone visit. With either a video or telephone visit, we are not always able to ensure that we have a secure connection.  By engaging in this virtual visit, you consent to the provision of healthcare and authorize for your insurance to be billed (if applicable) for the services provided during this visit. Depending on your insurance coverage, you may receive a charge related to this service.  I need to obtain your verbal consent now. Are you willing to proceed with your visit today? Christie Bryant has provided verbal consent on 08/10/2022 for a virtual visit (video or telephone). Christie Loveless, PA-C  Date: 08/10/2022 6:24 PM  Virtual Visit via Video Note   I, Christie Bryant, connected with  Christie Bryant  ([id number], [birth year]) on 08/10/22 at  6:15 PM EDT by a video-enabled telemedicine application and verified that I am speaking with the correct person using two identifiers.  Location: Patient: Virtual Visit Location Patient:  Home Provider: Virtual Visit Location Provider: Home Office   I discussed the limitations of evaluation and management by telemedicine and the availability of in person appointments. The patient expressed understanding and agreed to proceed.    History of Present Illness: Christie Bryant is a 67 y.o. who identifies as a female who was assigned female at birth, and is being seen today for Covid 69.  HPI: URI  This is a new problem. Episode onset: Tested positive for Covid 19 this morning; Symptoms started yesterday. The problem has been gradually worsening. Maximum temperature: subjective fevers. Associated symptoms include chest pain, congestion, coughing (deep), diarrhea, headaches, nausea (dry heaves this morning), a sore throat and wheezing (with coughing). Pertinent negatives include no ear pain, plugged ear sensation, rhinorrhea, sinus pain or vomiting. She has tried acetaminophen (tylenol liquid cold and cough) for the symptoms. The treatment provided no relief.      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   DM type 2 with diabetic peripheral neuropathy (HCC) 08/03/2018   Acquired hypothyroidism 02/14/2014   Hypertension 09/08/2011   Hyperlipidemia with target low density lipoprotein (LDL) cholesterol less than 70 mg/dL 40/98/1191   Neuropathy in  diabetes (HCC) 09/08/2011    Allergies: No Known Allergies Medications:  Current Outpatient Medications:    acetaminophen (TYLENOL) 325 MG tablet, Take 2 tablets (650 mg total) by mouth every 6 (six) hours as needed for mild pain (or Fever >/= 101)., Disp: , Rfl:    aspirin 81 MG chewable tablet, Chew 1 tablet (81 mg total) by  mouth daily., Disp: , Rfl:    atorvastatin (LIPITOR) 80 MG tablet, TAKE 1 TABLET BY MOUTH AT BEDTIME, Disp: 90 tablet, Rfl: 3   benzonatate (TESSALON) 100 MG capsule, Take 1 capsule (100 mg total) by mouth 3 (three) times daily as needed., Disp: 30 capsule, Rfl: 0   Cholecalciferol (VITAMIN D-3 PO), Take 2,000 Units by mouth daily. , Disp: , Rfl:    Dulaglutide (TRULICITY) 4.5 MG/0.5ML SOPN, Inject 4.5 mg as directed once a week., Disp: 6 mL, Rfl: 3   glucose blood (ONETOUCH ULTRA) test strip, 1 each by Other route as needed for other. Testing 3-4x a day, Disp: 1200 each, Rfl: 12   hydrochlorothiazide (HYDRODIURIL) 25 MG tablet, Take 1 tablet (25 mg total) by mouth daily., Disp: 30 tablet, Rfl: 3   Insulin Pen Needle (PEN NEEDLES 3/16") 31G X 5 MM MISC, 1 each by Does not apply route once a week., Disp: 100 each, Rfl: 12   levothyroxine (SYNTHROID) 125 MCG tablet, TAKE 1 TABLET BY MOUTH ONCE DAILY ON  AN  EMPTY  STOMACH  WITH  A  GLASS  OF  WATER  AT  LEAST  30  TO  60  MINUTES  BEFORE  BREAKFAST, Disp: 90 tablet, Rfl: 2   lisinopril (ZESTRIL) 10 MG tablet, Take 1 tablet by mouth once daily, Disp: 90 tablet, Rfl: 0   meclizine (ANTIVERT) 25 MG tablet, Take 1 tablet (25 mg total) by mouth 3 (three) times daily as needed for dizziness., Disp: 90 tablet, Rfl: 6   metFORMIN (GLUCOPHAGE) 1000 MG tablet, Take 1 tablet (1,000 mg total) by mouth 2 (two) times daily with a meal., Disp: 180 tablet, Rfl: 1   nirmatrelvir/ritonavir, renal dosing, (PAXLOVID) 10 x 150 MG & 10 x 100MG  TABS, Take 2 tablets by mouth 2 (two) times daily for 5 days. (Take nirmatrelvir 150 mg one tablet twice daily for 5 days and ritonavir 100 mg one tablet twice daily for 5 days) Patient GFR is 59, Disp: 20 tablet, Rfl: 0   omeprazole (PRILOSEC) 20 MG capsule, Take 20 mg by mouth daily., Disp: , Rfl:    propranolol (INDERAL) 20 MG tablet, Take 1 tablet (20 mg total) by mouth daily as needed (for heart palpitations)., Disp: 30 tablet,  Rfl: 0  Observations/Objective: Patient is well-developed, well-nourished in no acute distress.  Resting comfortably at home.  Head is normocephalic, atraumatic.  No labored breathing.  Speech is clear and coherent with logical content.  Patient is alert and oriented at baseline.    Assessment and Plan: 1. COVID-19 - MyChart COVID-19 home monitoring program; Future - nirmatrelvir/ritonavir, renal dosing, (PAXLOVID) 10 x 150 MG & 10 x 100MG  TABS; Take 2 tablets by mouth 2 (two) times daily for 5 days. (Take nirmatrelvir 150 mg one tablet twice daily for 5 days and ritonavir 100 mg one tablet twice daily for 5 days) Patient GFR is 59  Dispense: 20 tablet; Refill: 0 - benzonatate (TESSALON) 100 MG capsule; Take 1 capsule (100 mg total) by mouth 3 (three) times daily as needed.  Dispense: 30 capsule; Refill: 0  - Continue OTC symptomatic management  of choice - Will send OTC vitamins and supplement information through AVS - Paxlovid (renal) and tessalon perles prescribed - Patient enrolled in MyChart symptom monitoring - Push fluids - Rest as needed - Discussed return precautions and when to seek in-person evaluation, sent via AVS as well   Follow Up Instructions: I discussed the assessment and treatment plan with the patient. The patient was provided an opportunity to ask questions and all were answered. The patient agreed with the plan and demonstrated an understanding of the instructions.  A copy of instructions were sent to the patient via MyChart unless otherwise noted below.    The patient was advised to call back or seek an in-person evaluation if the symptoms worsen or if the condition fails to improve as anticipated.  Time:  I spent 10 minutes with the patient via telehealth technology discussing the above problems/concerns.    Christie Loveless, PA-C

## 2022-08-11 NOTE — Telephone Encounter (Signed)
Called to get patient scheduled, she stated that she had a virtual through our office at 6:15 pm last night.  Stated that they did send in a prescription for her so she doesn't need to be seen.

## 2022-08-11 NOTE — Telephone Encounter (Signed)
Please set up virtual visit. OK to double book at 8:40

## 2022-08-20 ENCOUNTER — Ambulatory Visit: Payer: Self-pay | Admitting: *Deleted

## 2022-08-20 NOTE — Telephone Encounter (Signed)
Summary: Still having COVID symptoms, SOB yesterday, not today cough congestion.   Pt called to cancel appointment for tomorrow with Dr. Laural Benes. I advised her to keep it or do it virtual if okay with the provider; however, pt declined and asked that the appt be canceled. Stated still having COVID symptoms, SOB yesterday, not today cough congestion.  Spoke with NT about COVID 08/10/2022   Seeking clinical advice.     Attempted to call patient to discuss symptoms-progress. No answer- left message to call office

## 2022-08-20 NOTE — Telephone Encounter (Signed)
     Chief Complaint: Pt. Reports she is feeling better from COVID symptoms. Symptoms: Cough, congestion Frequency: Last week Pertinent Negatives: Patient denies fever Disposition: [] ED /[] Urgent Care (no appt availability in office) / [] Appointment(In office/virtual)/ []  [facility]/ [x] Home Care/ [] Refused Recommended Disposition /[]  Mobile Bus/ []  Follow-up with PCP Additional Notes: Pt. Will call for worsening of symptoms.  Reason for Disposition  [1] PERSISTING SYMPTOMS OF COVID-19 AND [2] symptoms BETTER (improving)  Answer Assessment - Initial Assessment Questions 1. COVID-19 ONSET: "When did the symptoms of COVID-19 first start?"     08/10/22 2. DIAGNOSIS CONFIRMATION: "How were you diagnosed?" (e.g., COVID-19 oral or nasal viral test; COVID-19 antibody test; doctor visit)     Test 3. MAIN SYMPTOM:  "What is your main concern or symptom right now?" (e.g., breathing difficulty, cough, fatigue. loss of smell)     Cough 4. SYMPTOM ONSET: "When did the    start?"     08/10/22 5. BETTER-SAME-WORSE: "Are you getting better, staying the same, or getting worse over the last 1 to 2 weeks?"     Much better 6. RECENT MEDICAL VISIT: "Have you been seen by a healthcare provider (doctor, NP, PA) for these persisting COVID-19 symptoms?" If Yes, ask: "When were you seen?" (e.g., date)     VV 08/10/22 7. COUGH: "Do you have a cough?" If Yes, ask: "How bad is the cough?"       Yes 8. FEVER: "Do you have a fever?" If Yes, ask: "What is your temperature, how was it measured, and when did it start?"     No 9. BREATHING DIFFICULTY: "Are you having any trouble breathing?" If Yes, ask: "How bad is your breathing?" (e.g., mild, moderate, severe)    - MILD: No SOB at rest, mild SOB with walking, speaks normally in sentences, can lie down, no retractions, pulse < 100.    - MODERATE: SOB at rest, SOB with minimal exertion and prefers to sit, cannot lie down flat, speaks in phrases,  mild retractions, audible wheezing, pulse 100-120.    - SEVERE: Very SOB at rest, speaks in single words, struggling to breathe, sitting hunched forward, retractions, pulse > 120.       None 10. OTHER SYMPTOMS: "Do you have any other symptoms?"  (e.g., fatigue, headache, muscle pain, weakness)       Cough, congestion 11. HIGH RISK DISEASE: "Do you have any chronic medical problems?" (e.g., asthma, heart or lung disease, weak immune system, obesity, etc.)       No 12. VACCINE: "Have you gotten the COVID-19 vaccine?" If Yes, ask: "Which one, how many shots, when did you get it?"       N/a 13. PREGNANCY: "Is there any chance you are pregnant?" "When was your last menstrual period?"       No 14. O2 SATURATION MONITOR:  "Do you use an oxygen saturation monitor (pulse oximeter) at home?" If Yes, ask "What is your reading (oxygen level) today?" "What is your usual oxygen saturation reading?" (e.g., 95%)       N/a  Protocols used: Coronavirus (COVID-19) Persisting Symptoms Follow-up Call-A-[facility]

## 2022-08-21 ENCOUNTER — Ambulatory Visit: Payer: PPO | Admitting: Family Medicine

## 2022-09-02 ENCOUNTER — Other Ambulatory Visit: Payer: Self-pay | Admitting: Family Medicine

## 2022-09-03 NOTE — Telephone Encounter (Signed)
D/C 02/20/22 Requested Prescriptions  Refused Prescriptions Disp Refills   nortriptyline (PAMELOR) 10 MG capsule [Pharmacy Med Name: Nortriptyline HCl 10 MG Oral Capsule] 90 capsule 0    Sig: Take 1 capsule by mouth at bedtime     Psychiatry:  Antidepressants - Heterocyclics (TCAs) Passed - 09/02/2022  7:57 PM      Passed - Valid encounter within last 6 months    Recent Outpatient Visits           2 months ago DM type 2 with diabetic peripheral neuropathy (HCC)   [place] [facility] [place], Megan P, DO   6 months ago Routine general medical examination at a health care facility   [facility], Megan P, DO   9 months ago Diabetic polyneuropathy associated with type 2 diabetes mellitus [facility])   [place] [facility] [place], Megan P, DO   1 year ago DM type 2 with diabetic peripheral neuropathy [facility])   [place] [facility] [place], Megan P, DO   1 year ago Hypothyroidism, unspecified type   [place] [facility] [place], Megan P, DO

## 2022-10-09 ENCOUNTER — Other Ambulatory Visit: Payer: Self-pay | Admitting: Family Medicine

## 2022-10-12 NOTE — Telephone Encounter (Signed)
Requested Prescriptions  Pending Prescriptions Disp Refills   hydrochlorothiazide (HYDRODIURIL) 25 MG tablet [Pharmacy Med Name: hydroCHLOROthiazide 25 MG Oral Tablet] 90 tablet 0    Sig: Take 1 tablet by mouth once daily     Cardiovascular: Diuretics - Thiazide Failed - 10/09/2022  8:51 PM      Failed - Cr in normal range and within 180 days    Creatinine, Ser  Date Value Ref Range Status  02/20/2022 1.04 (H) 0.57 - 1.00 mg/dL Final   Creatinine,U  Date Value Ref Range Status  11/20/2013 75.8 mg/dL Final         Failed - K in normal range and within 180 days    Potassium  Date Value Ref Range Status  02/20/2022 4.3 3.5 - 5.2 mmol/L Final         Failed - Na in normal range and within 180 days    Sodium  Date Value Ref Range Status  02/20/2022 141 134 - 144 mmol/L Final         Passed - Last BP in normal range    BP Readings from Last 1 Encounters:  06/23/22 108/67         Passed - Valid encounter within last 6 months    Recent Outpatient Visits           3 months ago DM type 2 with diabetic peripheral neuropathy (HCC)   [place] [facility] [place], Megan P, DO   7 months ago Routine general medical examination at a health care facility   [facility], Megan P, DO   11 months ago Diabetic polyneuropathy associated with type 2 diabetes mellitus [facility])   [place] [facility] [place], Megan P, DO   1 year ago DM type 2 with diabetic peripheral neuropathy [facility])   [place] [facility] [place], Megan P, DO   1 year ago Hypothyroidism, unspecified type   [place] [facility] [place], Megan P, DO

## 2022-10-22 DIAGNOSIS — H43813 Vitreous degeneration, bilateral: Secondary | ICD-10-CM | POA: Diagnosis not present

## 2022-10-22 DIAGNOSIS — E119 Type 2 diabetes mellitus without complications: Secondary | ICD-10-CM | POA: Diagnosis not present

## 2022-10-22 DIAGNOSIS — H2513 Age-related nuclear cataract, bilateral: Secondary | ICD-10-CM | POA: Diagnosis not present

## 2022-10-22 LAB — HM DIABETES EYE EXAM

## 2022-10-28 ENCOUNTER — Encounter: Payer: Self-pay | Admitting: Family Medicine

## 2022-10-29 ENCOUNTER — Telehealth: Payer: Self-pay

## 2022-10-29 NOTE — Telephone Encounter (Signed)
4 boxes of Ozempic received for the patient. Called and notified patient that medication was ready to be picked up.

## 2022-10-30 ENCOUNTER — Other Ambulatory Visit: Payer: Self-pay | Admitting: Nurse Practitioner

## 2022-10-30 NOTE — Telephone Encounter (Signed)
Requested Prescriptions  Pending Prescriptions Disp Refills   lisinopril (ZESTRIL) 10 MG tablet [Pharmacy Med Name: Lisinopril 10 MG Oral Tablet] 90 tablet 0    Sig: Take 1 tablet by mouth once daily     Cardiovascular:  ACE Inhibitors Failed - 10/30/2022 12:17 PM      Failed - Cr in normal range and within 180 days    Creatinine, Ser  Date Value Ref Range Status  02/20/2022 1.04 (H) 0.57 - 1.00 mg/dL Final   Creatinine,U  Date Value Ref Range Status  11/20/2013 75.8 mg/dL Final         Failed - K in normal range and within 180 days    Potassium  Date Value Ref Range Status  02/20/2022 4.3 3.5 - 5.2 mmol/L Final         Passed - Patient is not pregnant      Passed - Last BP in normal range    BP Readings from Last 1 Encounters:  06/23/22 108/67         Passed - Valid encounter within last 6 months    Recent Outpatient Visits           4 months ago DM type 2 with diabetic peripheral neuropathy (HCC)   [place] [facility] [place], Megan P, DO   8 months ago Routine general medical examination at a health care facility   [facility], [facility] P, DO   11 months ago Diabetic polyneuropathy associated with type 2 diabetes mellitus [facility])   [place] [facility] [place], Megan P, DO   1 year ago DM type 2 with diabetic peripheral neuropathy [facility])   [place] [facility] [place], Megan P, DO   1 year ago Hypothyroidism, unspecified type    [facility] [place], Megan P, DO

## 2022-10-30 NOTE — Telephone Encounter (Signed)
Medication picked up by the patient.  

## 2023-01-13 ENCOUNTER — Other Ambulatory Visit: Payer: Self-pay | Admitting: Family Medicine

## 2023-01-18 NOTE — Telephone Encounter (Signed)
 Patient needs an appt.

## 2023-01-18 NOTE — Telephone Encounter (Signed)
 Requested Prescriptions  Pending Prescriptions Disp Refills   metFORMIN  (GLUCOPHAGE ) 1000 MG tablet [Pharmacy Med Name: metFORMIN  HCl 1000 MG Oral Tablet] 180 tablet 0    Sig: TAKE 1 TABLET BY MOUTH TWICE DAILY WITH MEALS     Endocrinology:  Diabetes - Biguanides Failed - 01/18/2023  9:50 AM      Failed - Cr in normal range and within 360 days    Creatinine, Ser  Date Value Ref Range Status  02/20/2022 1.04 (H) 0.57 - 1.00 mg/dL Final   Creatinine,U  Date Value Ref Range Status  11/20/2013 75.8 mg/dL Final         Failed - HBA1C is between 0 and 7.9 and within 180 days    Hemoglobin A1C  Date Value Ref Range Status  07/01/2020 7.6  Final   HB A1C (BAYER DCA - WAIVED)  Date Value Ref Range Status  06/23/2022 6.0 (H) 4.8 - 5.6 % Final    Comment:             Prediabetes: 5.7 - 6.4          Diabetes: >6.4          Glycemic control for adults with diabetes: <7.0          Failed - eGFR in normal range and within 360 days    GFR calc Af Amer  Date Value Ref Range Status  10/09/2019 >60 >60 mL/min Final   GFR, Estimated  Date Value Ref Range Status  04/28/2021 >60 >60 mL/min Final    Comment:    (NOTE) Calculated using the CKD-EPI Creatinine Equation (2021)    GFR  Date Value Ref Range Status  11/20/2013 57.41 (L) >60.00 mL/min Final   eGFR  Date Value Ref Range Status  02/20/2022 59 (L) >59 mL/min/1.73 Final         Failed - B12 Level in normal range and within 720 days    Vitamin B-12  Date Value Ref Range Status  02/20/2022 >2000 (H) 232 - 1245 pg/mL Final         Failed - Valid encounter within last 6 months    Recent Outpatient Visits           6 months ago DM type 2 with diabetic peripheral neuropathy (HCC)   [place] [place], Megan P, DO   11 months ago Routine general medical examination at a health care facility   [facility] [place], [place] P, DO   1 year ago Diabetic polyneuropathy associated  with type 2 diabetes mellitus (HCC)   [place] [facility] [place], Megan P, DO   1 year ago DM type 2 with diabetic peripheral neuropathy (HCC)   [place] [facility] [place], Megan P, DO   1 year ago Hypothyroidism, unspecified type   [place] [facility] [place], Megan P, DO              Passed - CBC within normal limits and completed in the last 12 months    WBC  Date Value Ref Range Status  02/20/2022 6.0 3.4 - 10.8 x10E3/uL Final  04/28/2021 7.6 4.0 - 10.5 K/uL Final   RBC  Date Value Ref Range Status  02/20/2022 4.29 3.77 - 5.28 x10E6/uL Final  04/28/2021 4.67 3.87 - 5.11 MIL/uL Final   Hemoglobin  Date Value Ref Range Status  02/20/2022 13.7 11.1 - 15.9 g/dL Final   Hematocrit  Date Value Ref Range  Status  02/20/2022 41.4 34.0 - 46.6 % Final   MCHC  Date Value Ref Range Status  02/20/2022 33.1 31.5 - 35.7 g/dL Final  95/82/7976 64.7 30.0 - 36.0 g/dL Final   [facility]  Date Value Ref Range Status  02/20/2022 31.9 26.6 - 33.0 pg Final  04/28/2021 33.0 26.0 - 34.0 pg Final   MCV  Date Value Ref Range Status  02/20/2022 97 79 - 97 fL Final   No results found for: PLTCOUNTKUC, LABPLAT, POCPLA RDW  Date Value Ref Range Status  02/20/2022 12.7 11.7 - 15.4 % Final          lisinopril  (ZESTRIL ) 10 MG tablet [Pharmacy Med Name: Lisinopril  10 MG Oral Tablet] 90 tablet 0    Sig: Take 1 tablet by mouth once daily     Cardiovascular:  ACE Inhibitors Failed - 01/18/2023  9:50 AM      Failed - Cr in normal range and within 180 days    Creatinine, Ser  Date Value Ref Range Status  02/20/2022 1.04 (H) 0.57 - 1.00 mg/dL Final   Creatinine,U  Date Value Ref Range Status  11/20/2013 75.8 mg/dL Final         Failed - K in normal range and within 180 days    Potassium  Date Value Ref Range Status  02/20/2022 4.3 3.5 - 5.2 mmol/L Final         Failed - Valid encounter within last 6 months    Recent Outpatient  Visits           6 months ago DM type 2 with diabetic peripheral neuropathy (HCC)   [place] [facility] [place], Megan P, DO   11 months ago Routine general medical examination at a health care facility   [facility] [place], [place] P, DO   1 year ago Diabetic polyneuropathy associated with type 2 diabetes mellitus (HCC)   [place] [facility] [place], Megan P, DO   1 year ago DM type 2 with diabetic peripheral neuropathy (HCC)   [place] [facility] [place], Megan P, DO   1 year ago Hypothyroidism, unspecified type   [place] [facility] [place], [place], DO              Passed - Patient is not pregnant      Passed - Last BP in normal range    BP Readings from Last 1 Encounters:  06/23/22 108/67          hydrochlorothiazide  (HYDRODIURIL ) 25 MG tablet [Pharmacy Med Name: hydroCHLOROthiazide  25 MG Oral Tablet] 90 tablet 0    Sig: Take 1 tablet by mouth once daily     Cardiovascular: Diuretics - Thiazide Failed - 01/18/2023  9:50 AM      Failed - Cr in normal range and within 180 days    Creatinine, Ser  Date Value Ref Range Status  02/20/2022 1.04 (H) 0.57 - 1.00 mg/dL Final   Creatinine,U  Date Value Ref Range Status  11/20/2013 75.8 mg/dL Final         Failed - K in normal range and within 180 days    Potassium  Date Value Ref Range Status  02/20/2022 4.3 3.5 - 5.2 mmol/L Final         Failed - Na in normal range and within 180 days    Sodium  Date Value Ref Range Status  02/20/2022 141 134 - 144 mmol/L Final  Failed - Valid encounter within last 6 months    Recent Outpatient Visits           6 months ago DM type 2 with diabetic peripheral neuropathy [facility])   [facility] [place], Megan P, DO   11 months ago Routine general medical examination at a health care facility   [facility] [place], [place] P, DO   1 year  ago Diabetic polyneuropathy associated with type 2 diabetes mellitus [facility])   [place] [facility] [place], Megan P, DO   1 year ago DM type 2 with diabetic peripheral neuropathy [facility])   [place] [facility] [place], Megan P, DO   1 year ago Hypothyroidism, unspecified type    [facility], [facility] P, DO              Passed - Last BP in normal range    BP Readings from Last 1 Encounters:  06/23/22 108/67

## 2023-01-20 ENCOUNTER — Other Ambulatory Visit: Payer: Self-pay | Admitting: Family Medicine

## 2023-01-25 NOTE — Telephone Encounter (Signed)
 Requested medication (s) are due for refill today: yes  Requested medication (s) are on the active medication list: no  Last refill:  08/05/21 expired 11/03/21  Future visit scheduled: for AWV  Notes to clinic:  please review   Requested Prescriptions  Pending Prescriptions Disp Refills   TRESIBA  FLEXTOUCH 200 UNIT/ML FlexTouch Pen [Pharmacy Med Name: Tresiba  FlexTouch 200 UNIT/ML Subcutaneous Solution Pen-injector] 3 mL 0    Sig: INJECT 60 TO 100 UNITS SUBCUTANEOUSLY AT BEDTIME     Endocrinology:  Diabetes - Insulins Failed - 01/25/2023  9:06 AM      Failed - HBA1C is between 0 and 7.9 and within 180 days    Hemoglobin A1C  Date Value Ref Range Status  07/01/2020 7.6  Final   HB A1C (BAYER DCA - WAIVED)  Date Value Ref Range Status  06/23/2022 6.0 (H) 4.8 - 5.6 % Final    Comment:             Prediabetes: 5.7 - 6.4          Diabetes: >6.4          Glycemic control for adults with diabetes: <7.0          Failed - Valid encounter within last 6 months    Recent Outpatient Visits           7 months ago DM type 2 with diabetic peripheral neuropathy (HCC)   [facility] [facility] [place], Megan P, DO   11 months ago Routine general medical examination at a health care facility   [facility] [place], [place] P, DO   1 year ago Diabetic polyneuropathy associated with type 2 diabetes mellitus [facility])   [facility] [facility] [place], Megan P, DO   1 year ago DM type 2 with diabetic peripheral neuropathy [facility])   [place] [facility] [place], Megan P, DO   1 year ago Hypothyroidism, unspecified type   [place] [facility] [place], Megan P, DO

## 2023-01-26 ENCOUNTER — Encounter: Payer: Self-pay | Admitting: Family Medicine

## 2023-01-29 MED ORDER — TRESIBA FLEXTOUCH 200 UNIT/ML ~~LOC~~ SOPN: 60.0000 [IU] | PEN_INJECTOR | Freq: Every day | SUBCUTANEOUS | 6 refills | Status: DC

## 2023-01-29 NOTE — Telephone Encounter (Signed)
All set. I've sent it through for you.

## 2023-02-25 ENCOUNTER — Ambulatory Visit: Payer: PPO | Admitting: Emergency Medicine

## 2023-02-25 VITALS — Ht 69.0 in | Wt 194.0 lb

## 2023-02-25 DIAGNOSIS — Z0000 Encounter for general adult medical examination without abnormal findings: Secondary | ICD-10-CM

## 2023-02-25 DIAGNOSIS — Z1231 Encounter for screening mammogram for malignant neoplasm of breast: Secondary | ICD-10-CM

## 2023-02-25 NOTE — Progress Notes (Signed)
Subjective:   Christie Bryant is a 68 y.o. female who presents for Medicare Annual (Subsequent) preventive examination.  This patient declined Interactive audio and Acupuncturist. Therefore the visit was completed with audio only.   Visit Complete: Virtual I connected with  [name] on 02/25/23 by a audio enabled telemedicine application and verified that I am speaking with the correct person using two identifiers.  Patient Location: Home  Provider Location: Home Office  I discussed the limitations of evaluation and management by telemedicine. The patient expressed understanding and agreed to proceed.  Vital Signs: Because this visit was a virtual/telehealth visit, some criteria may be missing or patient reported. Any vitals not documented were not able to be obtained and vitals that have been documented are patient reported.   Cardiac Risk Factors include: advanced age (>34men, >52 women);hypertension;dyslipidemia;diabetes mellitus     Objective:    Today's Vitals   02/25/23 1559  Weight: 194 lb (88 kg)  Height: 5\' 9"  (1.753 m)   Body mass index is 28.65 kg/m.     02/25/2023    4:15 PM 02/16/2022    3:21 PM 12/03/2020   12:00 PM 10/09/2019    3:44 PM 11/12/2014    7:11 AM 08/13/2014    6:59 AM  Advanced Directives  Does Patient Have a Medical Advance Directive? No No No No No No  Would patient like information on creating a medical advance directive? Yes (MAU/Ambulatory/Procedural Areas - Information given) No - Patient declined No - Patient declined No - Patient declined      Current Medications (verified) Outpatient Encounter Medications as of 02/25/2023  Medication Sig   acetaminophen (TYLENOL) 325 MG tablet Take 2 tablets (650 mg total) by mouth every 6 (six) hours as needed for mild pain (or Fever >/= 101).   aspirin 81 MG chewable tablet Chew 1 tablet (81 mg total) by mouth daily.   atorvastatin (LIPITOR) 80 MG tablet TAKE 1 TABLET BY MOUTH AT BEDTIME    Cholecalciferol (VITAMIN D-3 PO) Take 2,000 Units by mouth daily.    glucose blood (ONETOUCH ULTRA) test strip 1 each by Other route as needed for other. Testing 3-4x a day   hydrochlorothiazide (HYDRODIURIL) 25 MG tablet Take 1 tablet by mouth once daily   Insulin Pen Needle (PEN NEEDLES 3/16") 31G X 5 MM MISC 1 each by Does not apply route once a week.   levothyroxine (SYNTHROID) 125 MCG tablet TAKE 1 TABLET BY MOUTH ONCE DAILY ON  AN  EMPTY  STOMACH  WITH  A  GLASS  OF  WATER  AT  LEAST  30  TO  60  MINUTES  BEFORE  BREAKFAST   lisinopril (ZESTRIL) 10 MG tablet Take 1 tablet by mouth once daily   meclizine (ANTIVERT) 25 MG tablet Take 1 tablet (25 mg total) by mouth 3 (three) times daily as needed for dizziness.   metFORMIN (GLUCOPHAGE) 1000 MG tablet TAKE 1 TABLET BY MOUTH TWICE DAILY WITH MEALS   omeprazole (PRILOSEC) 20 MG capsule Take 20 mg by mouth daily.   propranolol (INDERAL) 20 MG tablet Take 1 tablet (20 mg total) by mouth daily as needed (for heart palpitations).   Semaglutide (OZEMPIC, 2 MG/DOSE, [place]) Inject into the skin once a week.   TRESIBA FLEXTOUCH 200 UNIT/ML FlexTouch Pen Inject 60-100 Units into the skin daily.   benzonatate (TESSALON) 100 MG capsule Take 1 capsule (100 mg total) by mouth 3 (three) times daily as needed. (Patient not taking:  Reported on 02/25/2023)   Dulaglutide (TRULICITY) 4.5 MG/0.5ML SOPN Inject 4.5 mg as directed once a week. (Patient not taking: Reported on 02/25/2023)   No facility-administered encounter medications on file as of 02/25/2023.    Allergies (verified) Patient has no known allergies.   History: Past Medical History:  Diagnosis Date   Chicken pox    Chronic kidney disease    Stones   Diabetes mellitus 2012   Dr. Tedd Sias   Hyperlipidemia    Hypertension    Neuromuscular disorder (HCC)    Recurrent boils    Stroke (HCC) 04/24/2021   Thyroid disease    Past Surgical History:  Procedure Laterality Date   BIOPSY THYROID      normal, Dr. Tedd Sias   BREAST REDUCTION SURGERY     COLONOSCOPY WITH PROPOFOL N/A 08/13/2014   Procedure: COLONOSCOPY WITH PROPOFOL;  Surgeon: Wallace Cullens, MD;  [facility] ENDOSCOPY;  Service: Gastroenterology;  Laterality: N/A;   COLONOSCOPY WITH PROPOFOL N/A 11/12/2014   Procedure: COLONOSCOPY WITH PROPOFOL;  Surgeon: Wallace Cullens, MD;  Location[facility] ENDOSCOPY;  Service: Gastroenterology;  Laterality: N/A;   COSMETIC SURGERY     gluteal abscess drainage  03/17/2013   TOTAL ABDOMINAL HYSTERECTOMY W/ BILATERAL SALPINGOOPHORECTOMY  01/13/1996   complete   TUBAL LIGATION     Family History  Problem Relation Age of Onset   Cancer Mother        ovarian/uterus   Hyperlipidemia Father    Heart disease Father    Hypertension Father    Cancer Father        throat   Hypertension Sister    Hypertension Sister    Hypertension Sister    Liver disease Sister    Heart disease Brother    Hypertension Brother    Stroke Maternal Grandmother    Hypertension Maternal Grandmother    Hypertension Maternal Grandfather    Pancreatic cancer Maternal Grandfather    Stroke Paternal Grandmother    Stroke Paternal Grandfather    Hypertension Other    Heart disease Other    Sudden death Other    Diabetes Other    Social History   Socioeconomic History   Marital status: Married    Spouse name: Charles   Number of children: 2   Years of education: Not on file   Highest education level: Some college, no degree  Occupational History   Occupation: retired  Tobacco Use   Smoking status: Never   Smokeless tobacco: Never  Vaping Use   Vaping status: Never Used  Substance and Sexual Activity   Alcohol use: No   Drug use: No   Sexual activity: Not Currently  Other Topics Concern   Not on file  Social History Narrative   Lives in [place] with husband, from [place].    Work - Dispensing optician - retired   Teacher, early years/pre Strain: Low Risk  (02/25/2023)   Overall  Financial Resource Strain (CARDIA)    Difficulty of Paying Living Expenses: Not hard at all  Food Insecurity: No Food Insecurity (02/25/2023)   Hunger Vital Sign    Worried About Running Out of Food in the Last Year: Never true    Ran Out of Food in the Last Year: Never true  Transportation Needs: No Transportation Needs (02/25/2023)   PRAPARE - Administrator, Civil Service (Medical): No    Lack of Transportation (Non-Medical): No  Physical Activity: Inactive (02/25/2023)   Exercise Vital  Sign    Days of Exercise per Week: 0 days    Minutes of Exercise per Session: 0 min  Stress: Stress Concern Present (02/25/2023)   [employer] of Occupational Health - Occupational Stress Questionnaire    Feeling of Stress : Very much  Social Connections: Moderately Isolated (02/25/2023)   Social Connection and Isolation Panel [NHANES]    Frequency of Communication with Friends and Family: More than three times a week    Frequency of Social Gatherings with Friends and Family: Twice a week    Attends Religious Services: Never    Database administrator or Organizations: No    Attends Engineer, structural: Never    Marital Status: Married    Tobacco Counseling Counseling given: Not Answered   Clinical Intake:  Pre-visit preparation completed: Yes  Pain : No/denies pain     BMI - recorded: 28.65 Nutritional Status: BMI 25 -29 Overweight Nutritional Risks: None Diabetes: Yes CBG done?: No Did pt. bring in CBG monitor from home?: No  How often do you need to have someone help you when you read instructions, pamphlets, or other written materials from your doctor or pharmacy?: 1 - Never  Interpreter Needed?: No  Information entered by :: Tora Kindred, CMA   Activities of Daily Living    02/25/2023    4:03 PM  In your present state of health, do you have any difficulty performing the following activities:  Hearing? 0  Vision? 0  Difficulty concentrating or  making decisions? 0  Walking or climbing stairs? 0  Dressing or bathing? 0  Doing errands, shopping? 0  Preparing Food and eating ? N  Using the Toilet? N  In the past six months, have you accidently leaked urine? N  Do you have problems with loss of bowel control? N  Managing your Medications? N  Managing your Finances? N  Housekeeping or managing your Housekeeping? N    Patient Care Team: Dorcas Carrow, DO as PCP - General (Family Medicine) Kieth Brightly, MD (General Surgery) Shelia Media, MD (Internal Medicine) Tedd Sias Marlana Salvage, MD as Physician Assistant (Endocrinology) Zettie Pho, [facility] (Inactive) (Pharmacist) [place] Crane, MD as Consulting Physician (Ophthalmology)  Indicate any recent Medical Services you may have received from other than [facility] providers in the past year (date may be approximate).     Assessment:   This is a routine wellness examination for [employer].  Hearing/Vision screen Hearing Screening - Comments:: Denies hearing loss Vision Screening - Comments:: Gets eye exams, Dr. Willey Blade [facility]   Goals Addressed             This Visit's Progress    Patient Stated       Try to manage stress while taking care of husband      Depression Screen    02/25/2023    4:10 PM 06/23/2022    2:08 PM 02/20/2022    3:21 PM 02/16/2022    3:20 PM 11/14/2021    3:09 PM 08/05/2021    2:03 PM 06/02/2021    1:47 PM  PHQ 2/9 Scores  PHQ - 2 Score 1 0 0 0 3 0 0  PHQ- 9 Score  3 0 0 9 3 1     Fall Risk    02/25/2023    4:17 PM 06/23/2022    2:08 PM 02/20/2022    3:21 PM 02/16/2022    3:22 PM 05/20/2021   11:19 AM  Fall Risk  Falls in the past year? 0 0 0 0 0  Number falls in past yr: 0 0 0 0   Injury with Fall? 0 0 0 0   Risk for fall due to : No Fall Risks No Fall Risks No Fall Risks No Fall Risks   Follow up Falls prevention discussed;Falls evaluation completed Falls evaluation completed Falls evaluation completed Falls prevention  discussed;Falls evaluation completed     MEDICARE RISK AT HOME: Medicare Risk at Home Any stairs in or around the home?: Yes If so, are there any without handrails?: No Home free of loose throw rugs in walkways, pet beds, electrical cords, etc?: Yes Adequate lighting in your home to reduce risk of falls?: Yes Life alert?: No Use of a cane, walker or w/c?: No Grab bars in the bathroom?: Yes Shower chair or bench in shower?: Yes Elevated toilet seat or a handicapped toilet?: Yes  TIMED UP AND GO:  Was the test performed?  No    Cognitive Function:        02/25/2023    4:18 PM 02/16/2022    3:29 PM  6CIT Screen  What Year? 0 points 0 points  What month? 0 points 0 points  What time? 0 points 0 points  Count back from 20 0 points 0 points  Months in reverse 0 points 0 points  Repeat phrase 2 points 0 points  Total Score 2 points 0 points    Immunizations Immunization History  Administered Date(s) Administered   Fluad Quad(high Dose 65+) 09/30/2020   Influenza, High Dose Seasonal PF 10/24/2021   Influenza-Unspecified 10/26/2012, 10/26/2013, 09/13/2014   PFIZER Comirnaty(Gray Top)Covid-19 Tri-Sucrose Vaccine 02/27/2019, 03/20/2019, 10/16/2019, 10/24/2021   PFIZER(Purple Top)SARS-COV-2 Vaccination 02/27/2019, 03/20/2019, 10/16/2019, 08/23/2020   Pneumococcal Conjugate-13 09/30/2020   Pneumococcal Polysaccharide-23 11/14/2021   Td 02/20/2022    TDAP status: Up to date  Flu Vaccine status: Up to date  Pneumococcal vaccine status: Up to date  Covid-19 vaccine status: Information provided on how to obtain vaccines.   Qualifies for Shingles Vaccine? Yes   Zostavax completed No   Shingrix Completed?: No.    Education has been provided regarding the importance of this vaccine. Patient has been advised to call insurance company to determine out of pocket expense if they have not yet received this vaccine. Advised may also receive vaccine at local pharmacy or Health Dept.  Verbalized acceptance and understanding.  Screening Tests Health Maintenance  Topic Date Due   Zoster Vaccines- Shingrix (1 of 2) Never done   INFLUENZA VACCINE  08/13/2022   COVID-19 Vaccine (9 - 2024-25 season) 09/13/2022   MAMMOGRAM  11/13/2022   FOOT EXAM  11/15/2022   HEMOGLOBIN A1C  12/23/2022   Diabetic kidney evaluation - eGFR measurement  02/21/2023   Diabetic kidney evaluation - Urine ACR  02/21/2023   OPHTHALMOLOGY EXAM  10/22/2023   Medicare Annual Wellness (AWV)  02/25/2024   Colonoscopy  11/11/2024   DTaP/Tdap/Td (2 - Tdap) 02/21/2032   Pneumonia Vaccine 39+ Years old  Completed   DEXA SCAN  Completed   Hepatitis C Screening  Completed   HPV VACCINES  Aged Out    Health Maintenance  Health Maintenance Due  Topic Date Due   Zoster Vaccines- Shingrix (1 of 2) Never done   INFLUENZA VACCINE  08/13/2022   COVID-19 Vaccine (9 - 2024-25 season) 09/13/2022   MAMMOGRAM  11/13/2022   FOOT EXAM  11/15/2022   HEMOGLOBIN A1C  12/23/2022   Diabetic kidney evaluation - eGFR  measurement  02/21/2023   Diabetic kidney evaluation - Urine ACR  02/21/2023    Colorectal cancer screening: Type of screening: Colonoscopy. Completed 11/12/14. Repeat every 10 years  Mammogram status: Ordered 02/25/23. Pt provided with contact info and advised to call to schedule appt.   Bone Density status: Completed 10/18/20. Results reflect: Bone density results: OSTEOPENIA. Repeat every 5 years.  Lung Cancer Screening: (Low Dose CT Chest recommended if Age 53-80 years, 20 pack-year currently smoking OR have quit w/in 15years.) does not qualify.   Lung Cancer Screening Referral: n/a  Additional Screening:  Hepatitis C Screening: does not qualify; Completed 09/30/20  Vision Screening: Recommended annual ophthalmology exams for early detection of glaucoma and other disorders of the eye. Is the patient up to date with their annual eye exam?  Yes  Who is the provider or what is the name of the  office in which the patient attends annual eye exams? Dr Willey Blade If pt is not established with a provider, would they like to be referred to a provider to establish care? No .   Dental Screening: Recommended annual dental exams for proper oral hygiene  Diabetic Foot Exam: Diabetic Foot Exam: Overdue, Pt has been advised about the importance in completing this exam. Pt is scheduled for diabetic foot exam on at next OV on 03/26/23.  Community Resource Referral / Chronic Care Management: CRR required this visit?  No   CCM required this visit?  No     Plan:     I have personally reviewed and noted the following in the patient's chart:   Medical and social history Use of alcohol, tobacco or illicit drugs  Current medications and supplements including opioid prescriptions. Patient is not currently taking opioid prescriptions. Functional ability and status Nutritional status Physical activity Advanced directives List of other physicians Hospitalizations, surgeries, and ER visits in previous 12 months Vitals Screenings to include cognitive, depression, and falls Referrals and appointments  In addition, I have reviewed and discussed with patient certain preventive protocols, quality metrics, and best practice recommendations. A written personalized care plan for preventive services as well as general preventive health recommendations were provided to patient.     Tora Kindred, CMA   02/25/2023   After Visit Summary: (MyChart) Due to this being a telephonic visit, the after visit summary with patients personalized plan was offered to patient via MyChart   Nurse Notes:  6 CIT Score- 2 Declined DM & Nutrition education Placed order for a MMG Needs shingles vaccine Needs DM foot exam at next OV Scheduled OV for 03/26/23 (last seen 06/23/22)

## 2023-02-25 NOTE — Patient Instructions (Signed)
Christie Bryant , Thank you for taking time to come for your Medicare Wellness Visit. I appreciate your ongoing commitment to your health goals. Please review the following plan we discussed and let me know if I can assist you in the future.   Referrals/Orders/Follow-Ups/Clinician Recommendations: I have placed an order for a mammogram. Call [facility] @ [phone] to schedule at your convenience. I have made you an appointment with Dr. Laural Benes for 03/26/23 @ 4:20pm. Discuss with her at that appointment if you need to get the shingles vaccines. You are also due for a diabetic foot exam and that can be done at the appointment also.  This is a list of the screening recommended for you and due dates:  Health Maintenance  Topic Date Due   Zoster (Shingles) Vaccine (1 of 2) Never done   COVID-19 Vaccine (9 - 2024-25 season) 09/13/2022   Mammogram  11/13/2022   Complete foot exam   11/15/2022   Hemoglobin A1C  12/23/2022   Yearly kidney function blood test for diabetes  02/21/2023   Yearly kidney health urinalysis for diabetes  02/21/2023   Eye exam for diabetics  10/22/2023   Medicare Annual Wellness Visit  02/25/2024   Colon Cancer Screening  11/11/2024   DTaP/Tdap/Td vaccine (2 - Tdap) 02/21/2032   Pneumonia Vaccine  Completed   Flu Shot  Completed   DEXA scan (bone density measurement)  Completed   Hepatitis C Screening  Completed   HPV Vaccine  Aged Out    Advanced directives: (ACP Link)Information on Advanced Care Planning can be found at [facility] Advance Health Care Directives Advance Health Care Directives ([email or web address] You may also get these forms at your doctor's office.  Once you have completed the forms, please bring a copy of your health care power of attorney and living will to the office to be added to your chart at your convenience.   Next Medicare Annual Wellness Visit scheduled for next year: Yes

## 2023-03-11 ENCOUNTER — Telehealth: Payer: Self-pay

## 2023-03-11 NOTE — Telephone Encounter (Signed)
 Patient aware her Ozempic through [employer] has arrived and is ready for pickup.

## 2023-03-16 ENCOUNTER — Other Ambulatory Visit: Payer: Self-pay | Admitting: Family Medicine

## 2023-03-17 NOTE — Telephone Encounter (Signed)
 Requested Prescriptions  Pending Prescriptions Disp Refills   levothyroxine (SYNTHROID) 125 MCG tablet [Pharmacy Med Name: Levothyroxine Sodium 125 MCG Oral Tablet] 90 tablet 0    Sig: TAKE 1 TABLET BY MOUTH ONCE DAILY ON  AN  EMPTY  STOMACH  WITH  A  GLASS  OF  WAER  AT  LEAST  30  TO  60  MINUTES  BEFORE  BREAKFAST     Endocrinology:  Hypothyroid Agents Failed - 03/17/2023 12:40 PM      Failed - TSH in normal range and within 360 days    TSH  Date Value Ref Range Status  02/20/2022 0.515 0.450 - 4.500 uIU/mL Final         Passed - Valid encounter within last 12 months    Recent Outpatient Visits           8 months ago DM type 2 with diabetic peripheral neuropathy (HCC)   [facility] [place], Megan P, DO   1 year ago Routine general medical examination at a health care facility   [facility] [place], [place] P, DO   1 year ago Diabetic polyneuropathy associated with type 2 diabetes mellitus (HCC)   [facility] [place], Megan P, DO   1 year ago DM type 2 with diabetic peripheral neuropathy [facility])   [place] [facility] [place], Megan P, DO   1 year ago Hypothyroidism, unspecified type   [facility] Dorcas Carrow, DO       Future Appointments             In 1 week Laural Benes, Oralia Rud, DO [place] [facility]

## 2023-03-26 ENCOUNTER — Ambulatory Visit (INDEPENDENT_AMBULATORY_CARE_PROVIDER_SITE_OTHER): Payer: PPO | Admitting: Family Medicine

## 2023-03-26 VITALS — BP 89/54 | HR 79

## 2023-03-26 DIAGNOSIS — Z7985 Long-term (current) use of injectable non-insulin antidiabetic drugs: Secondary | ICD-10-CM

## 2023-03-26 DIAGNOSIS — E785 Hyperlipidemia, unspecified: Secondary | ICD-10-CM

## 2023-03-26 DIAGNOSIS — I10 Essential (primary) hypertension: Secondary | ICD-10-CM

## 2023-03-26 DIAGNOSIS — M1A9XX0 Chronic gout, unspecified, without tophus (tophi): Secondary | ICD-10-CM | POA: Diagnosis not present

## 2023-03-26 DIAGNOSIS — E1142 Type 2 diabetes mellitus with diabetic polyneuropathy: Secondary | ICD-10-CM | POA: Diagnosis not present

## 2023-03-26 DIAGNOSIS — E039 Hypothyroidism, unspecified: Secondary | ICD-10-CM | POA: Diagnosis not present

## 2023-03-26 DIAGNOSIS — E538 Deficiency of other specified B group vitamins: Secondary | ICD-10-CM

## 2023-03-26 NOTE — Patient Instructions (Signed)
 You have an order for:  []   2D Mammogram  [x]   3D Mammogram  []   Bone Density     Please call for appointment:  [facility]  [address] [place] [address] [phone] [facility] [address] [place], [place] [address] [phone] [facility] [address] Geanie Logan [place], [place] [address] [phone]   Make sure to wear two-piece clothing.  No lotions, powders, or deodorants the day of the appointment. Make sure to bring picture ID and insurance card.  Bring list of medications you are currently taking including any supplements.   Schedule your [place] screening mammogram through MyChart!   Log into your MyChart account.  Go to 'Visit' (or 'Appointments' if on mobile App) --> Schedule an Appointment  Under 'Select a Reason for Visit' choose the Mammogram Screening option.  Complete the pre-visit questions and select the time and place that best fits your schedule.

## 2023-03-26 NOTE — Progress Notes (Signed)
 BP (!) 89/54   Pulse 79    Subjective:    Patient ID: Christie Bryant, female    DOB: [birth year], 68 y.o.   MRN: [id number]  HPI: Christie Bryant is a 68 y.o. female  Chief Complaint  Patient presents with   Diabetes    Not eating well and missing medication due to husband's health.    Hypertension   caretaker stress    Husband has been sick mostly in the hospital x 8 months.    Leg Swelling    Swelling and redness from sitting so much while he was in the hospital. Resolved while he was home.    grief    Husband passed 3 weeks. Son's very helpful and SIL's also and one still local to help her.    DIABETES Hypoglycemic episodes:no Polydipsia/polyuria: no Visual disturbance: no Chest pain: no Paresthesias: yes Glucose Monitoring: no Taking Insulin?: yes  Long acting insulin: every now and then, 60 units about 1x a week Blood Pressure Monitoring: not checking Retinal Examination: Up to Date Foot Exam: Not up to Date Diabetic Education: Completed Pneumovax: Up to Date Influenza: Up to Date Aspirin: yes  HYPERTENSION / HYPERLIPIDEMIA Satisfied with current treatment? yes Duration of hypertension: chronic BP monitoring frequency: not checking BP medication side effects: no Past BP meds: lisinopril, HCTZ Duration of hyperlipidemia: chronic Cholesterol medication side effects: no Cholesterol supplements: none Past cholesterol medications: atorvastatin Medication compliance: excellent compliance Aspirin: yes Recent stressors: no Recurrent headaches: no Visual changes: no Palpitations: no Dyspnea: no Chest pain: no Lower extremity edema: yes Dizzy/lightheaded: no   Relevant past medical, surgical, family and social history reviewed and updated as indicated. Interim medical history since our last visit reviewed. Allergies and medications reviewed and updated.  Review of Systems  Constitutional: Negative.   Respiratory: Negative.    Cardiovascular: Negative.    Gastrointestinal: Negative.   Musculoskeletal: Negative.   Neurological:  Positive for dizziness. Negative for tremors, seizures, syncope, facial asymmetry, speech difficulty, weakness, light-headedness, numbness and headaches.  Hematological: Negative.   Psychiatric/Behavioral:  Positive for dysphoric mood. Negative for agitation, behavioral problems, confusion, decreased concentration, hallucinations, self-injury, sleep disturbance and suicidal ideas. The patient is not nervous/anxious and is not hyperactive.     Per HPI unless specifically indicated above     Objective:    BP (!) 89/54   Pulse 79   Wt Readings from Last 3 Encounters:  02/25/23 194 lb (88 kg)  06/23/22 208 lb 3.2 oz (94.4 kg)  02/20/22 211 lb 6.4 oz (95.9 kg)    Physical Exam Vitals and nursing note reviewed.  Constitutional:      General: She is not in acute distress.    Appearance: Normal appearance. She is not ill-appearing, toxic-appearing or diaphoretic.  HENT:     Head: Normocephalic and atraumatic.     Right Ear: External ear normal.     Left Ear: External ear normal.     Nose: Nose normal.     Mouth/Throat:     Mouth: Mucous membranes are moist.     Pharynx: Oropharynx is clear.  Eyes:     General: No scleral icterus.       Right eye: No discharge.        Left eye: No discharge.     Extraocular Movements: Extraocular movements intact.     Conjunctiva/sclera: Conjunctivae normal.     Pupils: Pupils are equal, round, and reactive to light.  Cardiovascular:     Rate  and Rhythm: Normal rate and regular rhythm.     Pulses: Normal pulses.     Heart sounds: Normal heart sounds. No murmur heard.    No friction rub. No gallop.  Pulmonary:     Effort: Pulmonary effort is normal. No respiratory distress.     Breath sounds: Normal breath sounds. No stridor. No wheezing, rhonchi or rales.  Chest:     Chest wall: No tenderness.  Musculoskeletal:        General: Normal range of motion.     Cervical  back: Normal range of motion and neck supple.  Skin:    General: Skin is warm and dry.     Capillary Refill: Capillary refill takes less than 2 seconds.     Coloration: Skin is not jaundiced or pale.     Findings: No bruising, erythema, lesion or rash.  Neurological:     General: No focal deficit present.     Mental Status: She is alert and oriented to person, place, and time. Mental status is at baseline.  Psychiatric:        Mood and Affect: Mood normal.        Behavior: Behavior normal.        Thought Content: Thought content normal.        Judgment: Judgment normal.     Results for orders placed or performed in visit on 03/26/23  Bayer DCA Hb A1c Waived   Collection Time: 03/26/23  4:20 PM  Result Value Ref Range   HB A1C (BAYER DCA - WAIVED) 7.3 (H) 4.8 - 5.6 %  Microalbumin, Urine Waived   Collection Time: 03/26/23  4:20 PM  Result Value Ref Range   Microalb, Ur Waived 150 (H) 0 - 19 mg/L   Creatinine, Urine Waived 200 10 - 300 mg/dL   Microalb/Creat Ratio 30-300 (H) <30 mg/g  CBC with Differential/Platelet   Collection Time: 03/26/23  4:22 PM  Result Value Ref Range   WBC 6.0 3.4 - 10.8 x10E3/uL   RBC 3.52 (L) 3.77 - 5.28 x10E6/uL   Hemoglobin 11.2 11.1 - 15.9 g/dL   Hematocrit 13.0 86.5 - 46.6 %   MCV 98 (H) 79 - 97 fL   MCH 31.8 26.6 - 33.0 pg   MCHC 32.6 31.5 - 35.7 g/dL   RDW 78.4 69.6 - 29.5 %   Platelets 168 150 - 450 x10E3/uL   Neutrophils 50 Not Estab. %   Lymphs 38 Not Estab. %   Monocytes 8 Not Estab. %   Eos 4 Not Estab. %   Basos 0 Not Estab. %   Neutrophils Absolute 3.0 1.4 - 7.0 x10E3/uL   Lymphocytes Absolute 2.3 0.7 - 3.1 x10E3/uL   Monocytes Absolute 0.5 0.1 - 0.9 x10E3/uL   EOS (ABSOLUTE) 0.2 0.0 - 0.4 x10E3/uL   Basophils Absolute 0.0 0.0 - 0.2 x10E3/uL   Immature Granulocytes 0 Not Estab. %   Immature Grans (Abs) 0.0 0.0 - 0.1 x10E3/uL  Comprehensive metabolic panel   Collection Time: 03/26/23  4:22 PM  Result Value Ref Range   Glucose  147 (H) 70 - 99 mg/dL   BUN 21 8 - 27 mg/dL   Creatinine, Ser 2.84 (H) 0.57 - 1.00 mg/dL   eGFR 41 (L) >13 KG/MWN/0.27   BUN/Creatinine Ratio 15 12 - 28   Sodium 139 134 - 144 mmol/L   Potassium 4.4 3.5 - 5.2 mmol/L   Chloride 105 96 - 106 mmol/L   CO2 19 (L) 20 - 29  mmol/L   Calcium 10.2 8.7 - 10.3 mg/dL   Total Protein 6.6 6.0 - 8.5 g/dL   Albumin 4.1 3.9 - 4.9 g/dL   Globulin, Total 2.5 1.5 - 4.5 g/dL   Bilirubin Total 1.2 0.0 - 1.2 mg/dL   Alkaline Phosphatase 63 44 - 121 IU/L   AST 25 0 - 40 IU/L   ALT 24 0 - 32 IU/L  Uric acid   Collection Time: 03/26/23  4:22 PM  Result Value Ref Range   Uric Acid 9.7 (H) 3.0 - 7.2 mg/dL  Lipid Panel w/o Chol/HDL Ratio   Collection Time: 03/26/23  4:22 PM  Result Value Ref Range   Cholesterol, Total 99 (L) 100 - 199 mg/dL   Triglycerides 88 0 - 149 mg/dL   HDL 31 (L) >56 mg/dL   VLDL Cholesterol Cal 18 5 - 40 mg/dL   LDL Chol Calc (NIH) 50 0 - 99 mg/dL  TSH   Collection Time: 03/26/23  4:22 PM  Result Value Ref Range   TSH 1.720 0.450 - 4.500 uIU/mL  B12   Collection Time: 03/26/23  4:22 PM  Result Value Ref Range   Vitamin B-12 645 232 - 1,245 pg/mL      Assessment & Plan:   Problem List Items Addressed This Visit       Cardiovascular and Mediastinum   Hypertension - Primary (Chronic)   BP running very low. Stop lisinopril. She is anxious about swelling- so will continue hydrochlorothiazide for now, may need to stop net visit. Recheck 2-4 weeks.       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 Will continue ozempic and stop tresiba. Recheck in 2-4 weeks. May need to go back on lower dose of tresiba.       Relevant Orders   Bayer DCA Hb A1c Waived (Completed)    CBC with Differential/Platelet (Completed)   Comprehensive metabolic panel (Completed)   Microalbumin, Urine Waived (Completed)   Neuropathy in diabetes (HCC)   Doing OK. Continue current regimen. Continue to monitor. Call with any concerns.       Relevant Orders   Bayer DCA Hb A1c Waived (Completed)   CBC with Differential/Platelet (Completed)   Comprehensive metabolic panel (Completed)   Acquired hypothyroidism   Rechecking labs today. Await results. Treat as needed.       Relevant Orders   CBC with Differential/Platelet (Completed)   Comprehensive metabolic panel (Completed)   TSH (Completed)     Other   Hyperlipidemia with target low density lipoprotein (LDL) cholesterol less than 70 mg/dL   Under good control on current regimen. Continue current regimen. Continue to monitor. Call with any concerns. Refills given. Labs drawn today.        Relevant Orders   CBC with Differential/Platelet (Completed)   Comprehensive metabolic panel (Completed)   Lipid Panel w/o Chol/HDL Ratio (Completed)   Gout   Rechecking labs today. Await results. Treat as needed.       Relevant Orders   CBC with Differential/Platelet (Completed)   Comprehensive metabolic panel (Completed)   Uric acid (Completed)   B12 deficiency   Rechecking labs today. Await results. Treat as needed.       Relevant Orders   CBC with  Differential/Platelet (Completed)   Comprehensive metabolic panel (Completed)   B12 (Completed)     Follow up plan: Return in about 3 weeks (around 04/16/2023).

## 2023-03-27 LAB — COMPREHENSIVE METABOLIC PANEL
ALT: 24 IU/L (ref 0–32)
AST: 25 IU/L (ref 0–40)
Albumin: 4.1 g/dL (ref 3.9–4.9)
Alkaline Phosphatase: 63 IU/L (ref 44–121)
BUN/Creatinine Ratio: 15 (ref 12–28)
BUN: 21 mg/dL (ref 8–27)
Bilirubin Total: 1.2 mg/dL (ref 0.0–1.2)
CO2: 19 mmol/L — ABNORMAL LOW (ref 20–29)
Calcium: 10.2 mg/dL (ref 8.7–10.3)
Chloride: 105 mmol/L (ref 96–106)
Creatinine, Ser: 1.39 mg/dL — ABNORMAL HIGH (ref 0.57–1.00)
Globulin, Total: 2.5 g/dL (ref 1.5–4.5)
Glucose: 147 mg/dL — ABNORMAL HIGH (ref 70–99)
Potassium: 4.4 mmol/L (ref 3.5–5.2)
Sodium: 139 mmol/L (ref 134–144)
Total Protein: 6.6 g/dL (ref 6.0–8.5)
eGFR: 41 mL/min/{1.73_m2} — ABNORMAL LOW (ref >59)

## 2023-03-27 LAB — CBC WITH DIFFERENTIAL/PLATELET
Basophils Absolute: 0 10*3/uL (ref 0.0–0.2)
Basos: 0 %
EOS (ABSOLUTE): 0.2 10*3/uL (ref 0.0–0.4)
Eos: 4 %
Hematocrit: 34.4 % (ref 34.0–46.6)
Hemoglobin: 11.2 g/dL (ref 11.1–15.9)
Immature Grans (Abs): 0 10*3/uL (ref 0.0–0.1)
Immature Granulocytes: 0 %
Lymphocytes Absolute: 2.3 10*3/uL (ref 0.7–3.1)
Lymphs: 38 %
MCH: 31.8 pg (ref 26.6–33.0)
MCHC: 32.6 g/dL (ref 31.5–35.7)
MCV: 98 fL — ABNORMAL HIGH (ref 79–97)
Monocytes Absolute: 0.5 10*3/uL (ref 0.1–0.9)
Monocytes: 8 %
Neutrophils Absolute: 3 10*3/uL (ref 1.4–7.0)
Neutrophils: 50 %
Platelets: 168 10*3/uL (ref 150–450)
RBC: 3.52 x10E6/uL — ABNORMAL LOW (ref 3.77–5.28)
RDW: 14.1 % (ref 11.7–15.4)
WBC: 6 10*3/uL (ref 3.4–10.8)

## 2023-03-27 LAB — VITAMIN B12: Vitamin B-12: 645 pg/mL (ref 232–1245)

## 2023-03-27 LAB — LIPID PANEL W/O CHOL/HDL RATIO
Cholesterol, Total: 99 mg/dL — ABNORMAL LOW (ref 100–199)
HDL: 31 mg/dL — ABNORMAL LOW (ref >39)
LDL Chol Calc (NIH): 50 mg/dL (ref 0–99)
Triglycerides: 88 mg/dL (ref 0–149)
VLDL Cholesterol Cal: 18 mg/dL (ref 5–40)

## 2023-03-27 LAB — URIC ACID: Uric Acid: 9.7 mg/dL — ABNORMAL HIGH (ref 3.0–7.2)

## 2023-03-27 LAB — MICROALBUMIN, URINE WAIVED
Creatinine, Urine Waived: 200 mg/dL (ref 10–300)
Microalb, Ur Waived: 150 mg/L — ABNORMAL HIGH (ref 0–19)

## 2023-03-27 LAB — TSH: TSH: 1.72 u[IU]/mL (ref 0.450–4.500)

## 2023-03-27 LAB — BAYER DCA HB A1C WAIVED: HB A1C (BAYER DCA - WAIVED): 7.3 % — ABNORMAL HIGH (ref 4.8–5.6)

## 2023-03-28 ENCOUNTER — Encounter: Payer: Self-pay | Admitting: Family Medicine

## 2023-03-28 NOTE — Assessment & Plan Note (Signed)
 Up slightly with A1c of 7.3, but has not been taking her medicine regularly. Will continue ozempic and stop tresiba. Recheck in 2-4 weeks. May need to go back on lower dose of tresiba.

## 2023-03-28 NOTE — Assessment & Plan Note (Signed)
 Rechecking labs today. Await results. Treat as needed.

## 2023-03-28 NOTE — Assessment & Plan Note (Signed)
 Under good control on current regimen. Continue current regimen. Continue to monitor. Call with any concerns. Refills given. Labs drawn today.

## 2023-03-28 NOTE — Assessment & Plan Note (Signed)
 Doing OK. Continue current regimen. Continue to monitor. Call with any concerns.

## 2023-03-28 NOTE — Assessment & Plan Note (Signed)
 BP running very low. Stop lisinopril. She is anxious about swelling- so will continue hydrochlorothiazide for now, may need to stop net visit. Recheck 2-4 weeks.

## 2023-03-29 ENCOUNTER — Encounter: Payer: Self-pay | Admitting: Family Medicine

## 2023-03-30 DIAGNOSIS — Z1231 Encounter for screening mammogram for malignant neoplasm of breast: Secondary | ICD-10-CM | POA: Diagnosis not present

## 2023-03-30 LAB — HM MAMMOGRAPHY

## 2023-04-02 ENCOUNTER — Other Ambulatory Visit: Payer: Self-pay | Admitting: Family Medicine

## 2023-04-02 ENCOUNTER — Encounter: Payer: Self-pay | Admitting: Family Medicine

## 2023-04-02 MED ORDER — ALLOPURINOL 100 MG PO TABS
100.0000 mg | ORAL_TABLET | Freq: Every day | ORAL | 3 refills | Status: DC
Start: 2023-04-02 — End: 2023-07-18

## 2023-04-02 MED ORDER — COLCHICINE 0.6 MG PO TABS: 0.6000 mg | ORAL_TABLET | Freq: Every day | ORAL | 0 refills | Status: DC

## 2023-04-02 NOTE — Telephone Encounter (Signed)
 Copied from CRM [id number]. Topic: Clinical - Medication Refill >> Apr 02, 2023  8:41 AM Elmarie Shiley B wrote: Most Recent Primary Care Visit:  Provider: Olevia Perches P  Department: [facility]  Visit Type: OFFICE VISIT  Date: 03/26/2023  Patient is traveling on Monday and would like request sent in this morning.   Medication: atorvastatin (LIPITOR) 80 MG tablet, meclizine (ANTIVERT) 25 MG tablet, hydrochlorothiazide (HYDRODIURIL) 25 MG tablet  Has the patient contacted their pharmacy? Yes  (Agent: If yes, when and what did the pharmacy advise?)  Is this the correct pharmacy for this prescription? Yes  This is the patient's preferred pharmacy:  [facility] [address] [place] - [address] [place] [address] Phone: [phone] Fax: [phone]   Has the prescription been filled recently? Yes  Is the patient out of the medication? No  Has the patient been seen for an appointment in the last year OR does the patient have an upcoming appointment? Yes  Can we respond through MyChart? No  Agent: Please be advised that Rx refills may take up to 3 business days. We ask that you follow-up with your pharmacy.

## 2023-04-05 NOTE — Telephone Encounter (Signed)
 Requested medications are due for refill today.  yes  Requested medications are on the active medications list.  yes  Last refill. 04/16/2022 #[id number]  Future visit scheduled.   no  Notes to clinic.  Refill not delegated.    Requested Prescriptions  Pending Prescriptions Disp Refills   meclizine (ANTIVERT) 25 MG tablet [Pharmacy Med Name: Meclizine HCl 25 MG Oral Tablet] 90 tablet 0    Sig: TAKE 1 TABLET BY MOUTH THREE TIMES DAILY AS NEEDED FOR DIZZINESS     Not Delegated - Gastroenterology: Antiemetics Failed - 04/05/2023  7:56 AM      Failed - This refill cannot be delegated      Failed - Valid encounter within last 6 months    Recent Outpatient Visits           9 months ago DM type 2 with diabetic peripheral neuropathy (HCC)   [place] [facility] [place], Megan P, DO   1 year ago Routine general medical examination at a health care facility   [facility] [place], [place] P, DO   1 year ago Diabetic polyneuropathy associated with type 2 diabetes mellitus (HCC)   [place] [facility] [place], Megan P, DO   1 year ago DM type 2 with diabetic peripheral neuropathy (HCC)   [place] [facility] [place], Megan P, DO   1 year ago Hypothyroidism, unspecified type   [place] [facility] [place], Megan P, DO              Signed Prescriptions Disp Refills   hydrochlorothiazide (HYDRODIURIL) 25 MG tablet 90 tablet 1    Sig: Take 1 tablet by mouth once daily     Cardiovascular: Diuretics - Thiazide Failed - 04/05/2023  7:56 AM      Failed - Cr in normal range and within 180 days    Creatinine, Ser  Date Value Ref Range Status  03/26/2023 1.39 (H) 0.57 - 1.00 mg/dL Final   Creatinine,U  Date Value Ref Range Status  11/20/2013 75.8 mg/dL Final         Failed - Last BP in normal range    BP Readings from Last 1 Encounters:  03/26/23 (!) 89/54         Failed - Valid encounter within  last 6 months    Recent Outpatient Visits           9 months ago DM type 2 with diabetic peripheral neuropathy (HCC)   [place] [facility] [place], Megan P, DO   1 year ago Routine general medical examination at a health care facility   [facility] [place], [place] P, DO   1 year ago Diabetic polyneuropathy associated with type 2 diabetes mellitus (HCC)   [place] [facility] [place], Megan P, DO   1 year ago DM type 2 with diabetic peripheral neuropathy [facility])   [place] [facility] [place], Megan P, DO   1 year ago Hypothyroidism, unspecified type    [place] [facility] [place], Megan P, DO              Passed - K in normal range and within 180 days    Potassium  Date Value Ref Range Status  03/26/2023 4.4 3.5 - 5.2 mmol/L Final         Passed - Na in normal range and within 180 days    Sodium  Date  Value Ref Range Status  03/26/2023 139 134 - 144 mmol/L Final          atorvastatin (LIPITOR) 80 MG tablet 90 tablet 3    Sig: TAKE 1 TABLET BY MOUTH AT BEDTIME     Cardiovascular:  Antilipid - Statins Failed - 04/05/2023  7:56 AM      Failed - Lipid Panel in normal range within the last 12 months    Cholesterol, Total  Date Value Ref Range Status  03/26/2023 99 (L) 100 - 199 mg/dL Final   LDL Chol Calc (NIH)  Date Value Ref Range Status  03/26/2023 50 0 - 99 mg/dL Final   Direct LDL  Date Value Ref Range Status  04/24/2021 104.5 (H) 0 - 99 mg/dL Final    Comment:    Performed at [facility], [address] [place], [place] [address]   HDL  Date Value Ref Range Status  03/26/2023 31 (L) >39 mg/dL Final   Triglycerides  Date Value Ref Range Status  03/26/2023 88 0 - 149 mg/dL Final         Passed - Patient is not pregnant      Passed - Valid encounter within last 12 months    Recent Outpatient Visits           9 months ago DM type 2 with diabetic peripheral  neuropathy (HCC)   [place] [facility] [place], Megan P, DO   1 year ago Routine general medical examination at a health care facility   [facility], [place] P, DO   1 year ago Diabetic polyneuropathy associated with type 2 diabetes mellitus [facility])   [place] [facility] [place], Megan P, DO   1 year ago DM type 2 with diabetic peripheral neuropathy [facility])   [place] [facility] [place], Megan P, DO   1 year ago Hypothyroidism, unspecified type   [place] [facility] [place], Megan P, DO

## 2023-04-05 NOTE — Telephone Encounter (Signed)
 Can we please move her to next Tuesday- I have appointments available

## 2023-04-05 NOTE — Telephone Encounter (Signed)
 Requested Prescriptions  Pending Prescriptions Disp Refills   hydrochlorothiazide (HYDRODIURIL) 25 MG tablet [Pharmacy Med Name: hydroCHLOROthiazide 25 MG Oral Tablet] 90 tablet 0    Sig: Take 1 tablet by mouth once daily     Cardiovascular: Diuretics - Thiazide Failed - 04/05/2023  7:55 AM      Failed - Cr in normal range and within 180 days    Creatinine, Ser  Date Value Ref Range Status  03/26/2023 1.39 (H) 0.57 - 1.00 mg/dL Final   Creatinine,U  Date Value Ref Range Status  11/20/2013 75.8 mg/dL Final         Failed - Last BP in normal range    BP Readings from Last 1 Encounters:  03/26/23 (!) 89/54         Failed - Valid encounter within last 6 months    Recent Outpatient Visits           9 months ago DM type 2 with diabetic peripheral neuropathy (HCC)   [place] [facility] [place], Megan P, DO   1 year ago Routine general medical examination at a health care facility   [facility] [place], [place] P, DO   1 year ago Diabetic polyneuropathy associated with type 2 diabetes mellitus (HCC)   [place] [facility] [place], Megan P, DO   1 year ago DM type 2 with diabetic peripheral neuropathy [facility])   [place] [facility] [place] [facility] [place], Megan P, DO   1 year ago Hypothyroidism, unspecified type   [place] [facility] [place], Megan P, DO              Passed - K in normal range and within 180 days    Potassium  Date Value Ref Range Status  03/26/2023 4.4 3.5 - 5.2 mmol/L Final         Passed - Na in normal range and within 180 days    Sodium  Date Value Ref Range Status  03/26/2023 139 134 - 144 mmol/L Final          meclizine (ANTIVERT) 25 MG tablet [Pharmacy Med Name: Meclizine HCl 25 MG Oral Tablet] 90 tablet 0    Sig: TAKE 1 TABLET BY MOUTH THREE TIMES DAILY AS NEEDED FOR DIZZINESS     Not Delegated - Gastroenterology: Antiemetics Failed - 04/05/2023  7:55 AM      Failed - This  refill cannot be delegated      Failed - Valid encounter within last 6 months    Recent Outpatient Visits           9 months ago DM type 2 with diabetic peripheral neuropathy (HCC)   [facility] [place], Megan P, DO   1 year ago Routine general medical examination at a health care facility   [facility] [place], [place] P, DO   1 year ago Diabetic polyneuropathy associated with type 2 diabetes mellitus (HCC)   [place] [facility] [place], Megan P, DO   1 year ago DM type 2 with diabetic peripheral neuropathy [facility])   [place] [facility] [place], Megan P, DO   1 year ago Hypothyroidism, unspecified type    [facility], [facility] P, DO               atorvastatin (LIPITOR) 80 MG tablet [Pharmacy Med Name: Atorvastatin Calcium 80 MG Oral Tablet] 90 tablet 0    Sig:  TAKE 1 TABLET BY MOUTH AT BEDTIME     Cardiovascular:  Antilipid - Statins Failed - 04/05/2023  7:55 AM      Failed - Lipid Panel in normal range within the last 12 months    Cholesterol, Total  Date Value Ref Range Status  03/26/2023 99 (L) 100 - 199 mg/dL Final   LDL Chol Calc (NIH)  Date Value Ref Range Status  03/26/2023 50 0 - 99 mg/dL Final   Direct LDL  Date Value Ref Range Status  04/24/2021 104.5 (H) 0 - 99 mg/dL Final    Comment:    Performed at [facility], [address], [place], [place] [address]   HDL  Date Value Ref Range Status  03/26/2023 31 (L) >39 mg/dL Final   Triglycerides  Date Value Ref Range Status  03/26/2023 88 0 - 149 mg/dL Final         Passed - Patient is not pregnant      Passed - Valid encounter within last 12 months    Recent Outpatient Visits           9 months ago DM type 2 with diabetic peripheral neuropathy (HCC)   [place] [facility] [place], Megan P, DO   1 year ago Routine general medical examination at a health care facility    [facility] [place], [place] P, DO   1 year ago Diabetic polyneuropathy associated with type 2 diabetes mellitus [facility])   [place] [facility] [place], Megan P, DO   1 year ago DM type 2 with diabetic peripheral neuropathy [facility])   [place] [facility] [place], Megan P, DO   1 year ago Hypothyroidism, unspecified type   [place] [facility] [place], Megan P, DO

## 2023-04-05 NOTE — Telephone Encounter (Signed)
 Requested medications are due for refill today.  yes  Requested medications are on the active medications list.  yes  Last refill. 04/16/2022 #90 6 rf  Future visit scheduled.   no  Notes to clinic.  Refill not delegated.    Requested Prescriptions  Pending Prescriptions Disp Refills   meclizine (ANTIVERT) 25 MG tablet [Pharmacy Med Name: Meclizine HCl 25 MG Oral Tablet] 90 tablet 0    Sig: TAKE 1 TABLET BY MOUTH THREE TIMES DAILY AS NEEDED FOR DIZZINESS     Not Delegated - Gastroenterology: Antiemetics Failed - 04/05/2023  7:57 AM      Failed - This refill cannot be delegated      Failed - Valid encounter within last 6 months    Recent Outpatient Visits           9 months ago DM type 2 with diabetic peripheral neuropathy (HCC)   [place] [facility] [place], Megan P, DO   1 year ago Routine general medical examination at a health care facility   [facility] [place], [place] P, DO   1 year ago Diabetic polyneuropathy associated with type 2 diabetes mellitus (HCC)   [place] [facility] [place], Megan P, DO   1 year ago DM type 2 with diabetic peripheral neuropathy (HCC)   [place] [facility] [place], Megan P, DO   1 year ago Hypothyroidism, unspecified type   [facility] [place], Megan P, DO              Signed Prescriptions Disp Refills   hydrochlorothiazide (HYDRODIURIL) 25 MG tablet 90 tablet 1    Sig: Take 1 tablet by mouth once daily     Cardiovascular: Diuretics - Thiazide Failed - 04/05/2023  7:57 AM      Failed - Cr in normal range and within 180 days    Creatinine, Ser  Date Value Ref Range Status  03/26/2023 1.39 (H) 0.57 - 1.00 mg/dL Final   Creatinine,U  Date Value Ref Range Status  11/20/2013 75.8 mg/dL Final         Failed - Last BP in normal range    BP Readings from Last 1 Encounters:  03/26/23 (!) 89/54         Failed - Valid encounter within  last 6 months    Recent Outpatient Visits           9 months ago DM type 2 with diabetic peripheral neuropathy (HCC)   [place] [facility] [address], Megan P, DO   1 year ago Routine general medical examination at a health care facility   [facility] [place], [place] P, DO   1 year ago Diabetic polyneuropathy associated with type 2 diabetes mellitus (HCC)   [place] [facility] [place], Megan P, DO   1 year ago DM type 2 with diabetic peripheral neuropathy [facility])   [place] [facility] [place], Megan P, DO   1 year ago Hypothyroidism, unspecified type   [place] [facility] [place], Megan P, DO              Passed - K in normal range and within 180 days    Potassium  Date Value Ref Range Status  03/26/2023 4.4 3.5 - 5.2 mmol/L Final         Passed - Na in normal range and within 180 days    Sodium  Date  Value Ref Range Status  03/26/2023 139 134 - 144 mmol/L Final          atorvastatin (LIPITOR) 80 MG tablet 90 tablet 3    Sig: TAKE 1 TABLET BY MOUTH AT BEDTIME     Cardiovascular:  Antilipid - Statins Failed - 04/05/2023  7:57 AM      Failed - Lipid Panel in normal range within the last 12 months    Cholesterol, Total  Date Value Ref Range Status  03/26/2023 99 (L) 100 - 199 mg/dL Final   LDL Chol Calc (NIH)  Date Value Ref Range Status  03/26/2023 50 0 - 99 mg/dL Final   Direct LDL  Date Value Ref Range Status  04/24/2021 104.5 (H) 0 - 99 mg/dL Final    Comment:    Performed at [facility], [address] [place], [place] [address]   HDL  Date Value Ref Range Status  03/26/2023 31 (L) >39 mg/dL Final   Triglycerides  Date Value Ref Range Status  03/26/2023 88 0 - 149 mg/dL Final         Passed - Patient is not pregnant      Passed - Valid encounter within last 12 months    Recent Outpatient Visits           9 months ago DM type 2 with diabetic peripheral  neuropathy (HCC)   [place] [facility] [place], Megan P, DO   1 year ago Routine general medical examination at a health care facility   [place], [place] P, DO   1 year ago Diabetic polyneuropathy associated with type 2 diabetes mellitus [facility])   [place] [place], Megan P, DO   1 year ago DM type 2 with diabetic peripheral neuropathy [facility])   [place] [facility] [place], Megan P, DO   1 year ago Hypothyroidism, unspecified type    [facility] [place], Megan P, DO

## 2023-04-06 NOTE — Telephone Encounter (Signed)
 Appt scheduled

## 2023-04-12 ENCOUNTER — Other Ambulatory Visit: Payer: Self-pay | Admitting: Family Medicine

## 2023-04-14 NOTE — Telephone Encounter (Signed)
 Requested Prescriptions  Pending Prescriptions Disp Refills   metFORMIN (GLUCOPHAGE) 1000 MG tablet [Pharmacy Med Name: metFORMIN HCl 1000 MG Oral Tablet] 180 tablet 0    Sig: TAKE 1 TABLET BY MOUTH TWICE DAILY WITH MEALS     Endocrinology:  Diabetes - Biguanides Failed - 04/14/2023  8:27 AM      Failed - Cr in normal range and within 360 days    Creatinine, Ser  Date Value Ref Range Status  03/26/2023 1.39 (H) 0.57 - 1.00 mg/dL Final   Creatinine,U  Date Value Ref Range Status  11/20/2013 75.8 mg/dL Final         Failed - eGFR in normal range and within 360 days    GFR calc Af Amer  Date Value Ref Range Status  10/09/2019 >60 >60 mL/min Final   GFR, Estimated  Date Value Ref Range Status  04/28/2021 >60 >60 mL/min Final    Comment:    (NOTE) Calculated using the CKD-EPI Creatinine Equation (2021)    GFR  Date Value Ref Range Status  11/20/2013 57.41 (L) >60.00 mL/min Final   eGFR  Date Value Ref Range Status  03/26/2023 41 (L) >59 mL/min/1.73 Final         Passed - HBA1C is between 0 and 7.9 and within 180 days    Hemoglobin A1C  Date Value Ref Range Status  07/01/2020 7.6  Final   HB A1C (BAYER DCA - WAIVED)  Date Value Ref Range Status  03/26/2023 7.3 (H) 4.8 - 5.6 % Final    Comment:             Prediabetes: 5.7 - 6.4          Diabetes: >6.4          Glycemic control for adults with diabetes: <7.0          Passed - B12 Level in normal range and within 720 days    Vitamin B-12  Date Value Ref Range Status  03/26/2023 645 232 - 1,245 pg/mL Final         Passed - Valid encounter within last 6 months    Recent Outpatient Visits           2 weeks ago Primary hypertension   [place] [facility] [place], Megan P, DO              Passed - CBC within normal limits and completed in the last 12 months    WBC  Date Value Ref Range Status  03/26/2023 6.0 3.4 - 10.8 x10E3/uL Final  04/28/2021 7.6 4.0 - 10.5 K/uL Final   RBC  Date  Value Ref Range Status  03/26/2023 3.52 (L) 3.77 - 5.28 x10E6/uL Final  04/28/2021 4.67 3.87 - 5.11 MIL/uL Final   Hemoglobin  Date Value Ref Range Status  03/26/2023 11.2 11.1 - 15.9 g/dL Final   Hematocrit  Date Value Ref Range Status  03/26/2023 34.4 34.0 - 46.6 % Final   MCHC  Date Value Ref Range Status  03/26/2023 32.6 31.5 - 35.7 g/dL Final  16/10/9602 54.0 30.0 - 36.0 g/dL Final   [facility]  Date Value Ref Range Status  03/26/2023 31.8 26.6 - 33.0 pg Final  04/28/2021 33.0 26.0 - 34.0 pg Final   MCV  Date Value Ref Range Status  03/26/2023 98 (H) 79 - 97 fL Final   No results found for: "PLTCOUNTKUC", "LABPLAT", "POCPLA" RDW  Date Value Ref Range Status  03/26/2023 14.1 11.7 - 15.4 %  Final

## 2023-04-20 ENCOUNTER — Encounter: Payer: Self-pay | Admitting: Family Medicine

## 2023-04-20 ENCOUNTER — Ambulatory Visit (INDEPENDENT_AMBULATORY_CARE_PROVIDER_SITE_OTHER): Admitting: Family Medicine

## 2023-04-20 VITALS — BP 130/81 | HR 89 | Ht 69.0 in | Wt 187.8 lb

## 2023-04-20 DIAGNOSIS — I10 Essential (primary) hypertension: Secondary | ICD-10-CM | POA: Diagnosis not present

## 2023-04-20 DIAGNOSIS — E1142 Type 2 diabetes mellitus with diabetic polyneuropathy: Secondary | ICD-10-CM | POA: Diagnosis not present

## 2023-04-20 DIAGNOSIS — Z7985 Long-term (current) use of injectable non-insulin antidiabetic drugs: Secondary | ICD-10-CM

## 2023-04-20 MED ORDER — TIRZEPATIDE 10 MG/0.5ML ~~LOC~~ SOAJ: 10.0000 mg | SUBCUTANEOUS | 0 refills | Status: DC

## 2023-04-20 NOTE — Progress Notes (Signed)
 BP 130/81 (BP Location: Left Arm, Patient Position: Sitting, Cuff Size: Normal)   Pulse 89   Ht 5\' 9"  (1.753 m)   Wt 187 lb 12.8 oz (85.2 kg)   SpO2 97%   BMI 27.73 kg/m    Subjective:    Patient ID: Christie Bryant, female    DOB: 1955/12/19, 69 y.o.   MRN: [id number]  HPI: Christie Bryant is a 68 y.o. female  Chief Complaint  Patient presents with   Hypertension   HYPERTENSION  Hypertension status: better  Satisfied with current treatment? yes Duration of hypertension: chronic BP monitoring frequency:  a few times a month- in the 120s/70s BP medication side effects:  no Medication compliance: excellent compliance Previous BP meds:HCTZ Aspirin: yes Recurrent headaches: no Visual changes: no Palpitations: no Dyspnea: no Chest pain: no Lower extremity edema: no Dizzy/lightheaded: yes  DIABETES Hypoglycemic episodes:no Polydipsia/polyuria: no Visual disturbance: no Chest pain: no Paresthesias: no Glucose Monitoring: yes  Accucheck frequency: occasionaly Taking Insulin?: no Blood Pressure Monitoring: a few times a month Retinal Examination: Up to Date Foot Exam: Not up to Date Diabetic Education: Completed Pneumovax: Up to Date Influenza: Up to Date Aspirin: yes  Relevant past medical, surgical, family and social history reviewed and updated as indicated. Interim medical history since our last visit reviewed. Allergies and medications reviewed and updated.  Review of Systems  Constitutional: Negative.   Respiratory: Negative.    Cardiovascular: Negative.   Musculoskeletal: Negative.   Psychiatric/Behavioral: Negative.      Per HPI unless specifically indicated above     Objective:    BP 130/81 (BP Location: Left Arm, Patient Position: Sitting, Cuff Size: Normal)   Pulse 89   Ht 5\' 9"  (1.753 m)   Wt 187 lb 12.8 oz (85.2 kg)   SpO2 97%   BMI 27.73 kg/m   Wt Readings from Last 3 Encounters:  04/20/23 187 lb 12.8 oz (85.2 kg)  02/25/23 194 lb (88 kg)   06/23/22 208 lb 3.2 oz (94.4 kg)    Physical Exam Vitals and nursing note reviewed.  Constitutional:      General: She is not in acute distress.    Appearance: Normal appearance. She is not ill-appearing, toxic-appearing or diaphoretic.  HENT:     Head: Normocephalic and atraumatic.     Right Ear: External ear normal.     Left Ear: External ear normal.     Nose: Nose normal.     Mouth/Throat:     Mouth: Mucous membranes are moist.     Pharynx: Oropharynx is clear.  Eyes:     General: No scleral icterus.       Right eye: No discharge.        Left eye: No discharge.     Extraocular Movements: Extraocular movements intact.     Conjunctiva/sclera: Conjunctivae normal.     Pupils: Pupils are equal, round, and reactive to light.  Cardiovascular:     Rate and Rhythm: Normal rate and regular rhythm.     Pulses: Normal pulses.     Heart sounds: Normal heart sounds. No murmur heard.    No friction rub. No gallop.  Pulmonary:     Effort: Pulmonary effort is normal. No respiratory distress.     Breath sounds: Normal breath sounds. No stridor. No wheezing, rhonchi or rales.  Chest:     Chest wall: No tenderness.  Musculoskeletal:        General: Normal range of motion.  Cervical back: Normal range of motion and neck supple.  Skin:    General: Skin is warm and dry.     Capillary Refill: Capillary refill takes less than 2 seconds.     Coloration: Skin is not jaundiced or pale.     Findings: No bruising, erythema, lesion or rash.  Neurological:     General: No focal deficit present.     Mental Status: She is alert and oriented to person, place, and time. Mental status is at baseline.  Psychiatric:        Mood and Affect: Mood normal.        Behavior: Behavior normal.        Thought Content: Thought content normal.        Judgment: Judgment normal.     Results for orders placed or performed in visit on 04/02/23  HM MAMMOGRAPHY   Collection Time: 03/30/23  7:20 AM  Result  Value Ref Range   HM Mammogram 0-4 Bi-Rad 0-4 Bi-Rad, Self Reported Normal      Assessment & Plan:   Problem List Items Addressed This Visit       Cardiovascular and Mediastinum   Hypertension - Primary (Chronic)   Back in the normal range. Still feeling tired. Will continue hydrochlorothiazide and recheck kidney function. Follow up in 2 months for regular visit.       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 Switch to 10mg  mounjaro weekly. Due for A1c in 2 months. Follow up then.       Relevant Medications   tirzepatide [facility]) 10 MG/0.5ML Pen     Follow up plan: Return Around June 14, follow up.

## 2023-04-20 NOTE — Assessment & Plan Note (Signed)
 Would like to change from ozempic to mounjaro. Switch to 10mg  mounjaro weekly. Due for A1c in 2 months. Follow up then.

## 2023-04-20 NOTE — Assessment & Plan Note (Signed)
 Back in the normal range. Still feeling tired. Will continue hydrochlorothiazide and recheck kidney function. Follow up in 2 months for regular visit.

## 2023-04-21 LAB — BASIC METABOLIC PANEL WITH GFR
BUN/Creatinine Ratio: 12 (ref 12–28)
BUN: 15 mg/dL (ref 8–27)
CO2: 19 mmol/L — ABNORMAL LOW (ref 20–29)
Calcium: 10.3 mg/dL (ref 8.7–10.3)
Chloride: 100 mmol/L (ref 96–106)
Creatinine, Ser: 1.23 mg/dL — ABNORMAL HIGH (ref 0.57–1.00)
Glucose: 123 mg/dL — ABNORMAL HIGH (ref 70–99)
Potassium: 3.7 mmol/L (ref 3.5–5.2)
Sodium: 141 mmol/L (ref 134–144)
eGFR: 48 mL/min/{1.73_m2} — ABNORMAL LOW (ref >59)

## 2023-04-22 ENCOUNTER — Encounter: Payer: Self-pay | Admitting: Family Medicine

## 2023-04-27 ENCOUNTER — Telehealth: Payer: Self-pay | Admitting: Family Medicine

## 2023-04-27 NOTE — Telephone Encounter (Signed)
 Copied from CRM [id number]. Topic: General - Other >> Apr 27, 2023  3:45 PM Ary Bitter R wrote: Reason for CRM: [employer] called regarding pa for MOUNJARO. They need a diagnosis and the medical chart notes. Please follow up. [phone] option 2.

## 2023-04-28 NOTE — Telephone Encounter (Signed)
 Chart notes from 3/14/20325 with A1C documented have been faxed to the coverage determination dept [phone] PA ID # [phone]

## 2023-04-30 NOTE — Telephone Encounter (Signed)
 Patient is aware her prescription is ready for pickup with $0 co-pay at this time.   Authorization is in place through [employer] 04/28/23-04/27/24 for 2mL per 28 days. PA # [id number]

## 2023-05-04 NOTE — Telephone Encounter (Signed)
 error

## 2023-05-07 ENCOUNTER — Ambulatory Visit: Admitting: Family Medicine

## 2023-05-10 ENCOUNTER — Ambulatory Visit: Admitting: Family Medicine

## 2023-05-12 ENCOUNTER — Other Ambulatory Visit: Payer: Self-pay | Admitting: Family Medicine

## 2023-05-15 NOTE — Telephone Encounter (Signed)
 Requested medication (s) are due for refill today: Yes  Requested medication (s) are on the active medication list: Yes  Last refill:  04/05/23  Future visit scheduled: Yes  Notes to clinic:  Unable to refill per protocol, cannot delegate.      Requested Prescriptions  Pending Prescriptions Disp Refills   meclizine  (ANTIVERT ) 25 MG tablet [Pharmacy Med Name: Meclizine  HCl 25 MG Oral Tablet] 90 tablet 0    Sig: TAKE 1 TABLET BY MOUTH THREE TIMES DAILY AS NEEDED FOR DIZZINESS     Not Delegated - Gastroenterology: Antiemetics Failed - 05/15/2023  9:13 PM      Failed - This refill cannot be delegated      Passed - Valid encounter within last 6 months    Recent Outpatient Visits           3 weeks ago Primary hypertension   [facility] [facility] [place], Megan P, DO   1 month ago Primary hypertension   [place] [facility] [place], [place], DO

## 2023-06-08 ENCOUNTER — Telehealth: Payer: Self-pay

## 2023-06-08 NOTE — Telephone Encounter (Signed)
 Pt return call explain provider d/c Ozempic , now she is taking Mounjaro.she explain she did not need to submitted a PAP for this med.

## 2023-06-08 NOTE — Telephone Encounter (Signed)
 Gave pt a call, pt is coming up for re-enrollment on 07/08/23 Novo Nordisk (Ozempic ) left a HIPAA VM.

## 2023-06-09 ENCOUNTER — Other Ambulatory Visit: Payer: Self-pay | Admitting: Family Medicine

## 2023-06-11 NOTE — Telephone Encounter (Signed)
 Requested Prescriptions  Pending Prescriptions Disp Refills   levothyroxine  (SYNTHROID ) 125 MCG tablet [Pharmacy Med Name: Levothyroxine  Sodium 125 MCG Oral Tablet] 90 tablet 3    Sig: TAKE 1 TABLET BY MOUTH ON AN EMPTY STOMACH WITH  A  GLASS  OF  WATER  AT  LEAST  30-60  MINUTES  BEFORE  BREAKFAST     Endocrinology:  Hypothyroid Agents Passed - 06/11/2023  2:17 PM      Passed - TSH in normal range and within 360 days    TSH  Date Value Ref Range Status  03/26/2023 1.720 0.450 - 4.500 uIU/mL Final         Passed - Valid encounter within last 12 months    Recent Outpatient Visits           1 month ago Primary hypertension   [place] [facility] [place], [place], DO   2 months ago Primary hypertension    [facility] [place], [place], DO

## 2023-06-28 ENCOUNTER — Ambulatory Visit: Admitting: Family Medicine

## 2023-07-03 ENCOUNTER — Other Ambulatory Visit: Payer: Self-pay | Admitting: Nurse Practitioner

## 2023-07-03 ENCOUNTER — Other Ambulatory Visit: Payer: Self-pay | Admitting: Family Medicine

## 2023-07-06 ENCOUNTER — Other Ambulatory Visit: Payer: Self-pay | Admitting: Family Medicine

## 2023-07-06 NOTE — Telephone Encounter (Signed)
 Requested medications are due for refill today.  yes  Requested medications are on the active medications list.  yes  Last refill. 05/17/2023 #90 0 rf  Future visit scheduled.   yes  Notes to clinic.  Refill not delegated.    Requested Prescriptions  Pending Prescriptions Disp Refills   meclizine  (ANTIVERT ) 25 MG tablet [Pharmacy Med Name: Meclizine  HCl 25 MG Oral Tablet] 90 tablet 0    Sig: TAKE 1 TABLET BY MOUTH THREE TIMES DAILY AS NEEDED FOR DIZZINESS     Not Delegated - Gastroenterology: Antiemetics Failed - 07/06/2023 11:11 AM      Failed - This refill cannot be delegated      Passed - Valid encounter within last 6 months    Recent Outpatient Visits           2 months ago Primary hypertension   [place] [facility] [place], Megan P, DO   3 months ago Primary hypertension   [place] [place], [place], DO

## 2023-07-06 NOTE — Telephone Encounter (Signed)
 Requested Prescriptions  Pending Prescriptions Disp Refills   metFORMIN  (GLUCOPHAGE ) 1000 MG tablet [Pharmacy Med Name: metFORMIN  HCl 1000 MG Oral Tablet] 180 tablet 1    Sig: TAKE 1 TABLET BY MOUTH TWICE DAILY WITH MEALS     Endocrinology:  Diabetes - Biguanides Failed - 07/06/2023 11:13 AM      Failed - Cr in normal range and within 360 days    Creatinine, Ser  Date Value Ref Range Status  04/20/2023 1.23 (H) 0.57 - 1.00 mg/dL Final   Creatinine,U  Date Value Ref Range Status  11/20/2013 75.8 mg/dL Final         Failed - eGFR in normal range and within 360 days    GFR calc Af Amer  Date Value Ref Range Status  10/09/2019 >60 >60 mL/min Final   GFR, Estimated  Date Value Ref Range Status  04/28/2021 >60 >60 mL/min Final    Comment:    (NOTE) Calculated using the CKD-EPI Creatinine Equation (2021)    GFR  Date Value Ref Range Status  11/20/2013 57.41 (L) >60.00 mL/min Final   eGFR  Date Value Ref Range Status  04/20/2023 48 (L) >59 mL/min/1.73 Final         Passed - HBA1C is between 0 and 7.9 and within 180 days    Hemoglobin A1C  Date Value Ref Range Status  07/01/2020 7.6  Final   HB A1C (BAYER DCA - WAIVED)  Date Value Ref Range Status  03/26/2023 7.3 (H) 4.8 - 5.6 % Final    Comment:             Prediabetes: 5.7 - 6.4          Diabetes: >6.4          Glycemic control for adults with diabetes: <7.0          Passed - B12 Level in normal range and within 720 days    Vitamin B-12  Date Value Ref Range Status  03/26/2023 645 232 - 1,245 pg/mL Final         Passed - Valid encounter within last 6 months    Recent Outpatient Visits           2 months ago Primary hypertension   [place] [facility] [place], Megan P, DO   3 months ago Primary hypertension   [place] [facility] [place], Megan P, DO              Passed - CBC within normal limits and completed in the last 12 months    WBC  Date Value Ref Range Status   03/26/2023 6.0 3.4 - 10.8 x10E3/uL Final  04/28/2021 7.6 4.0 - 10.5 K/uL Final   RBC  Date Value Ref Range Status  03/26/2023 3.52 (L) 3.77 - 5.28 x10E6/uL Final  04/28/2021 4.67 3.87 - 5.11 MIL/uL Final   Hemoglobin  Date Value Ref Range Status  03/26/2023 11.2 11.1 - 15.9 g/dL Final   Hematocrit  Date Value Ref Range Status  03/26/2023 34.4 34.0 - 46.6 % Final   MCHC  Date Value Ref Range Status  03/26/2023 32.6 31.5 - 35.7 g/dL Final  95/82/7976 64.7 30.0 - 36.0 g/dL Final   [facility]  Date Value Ref Range Status  03/26/2023 31.8 26.6 - 33.0 pg Final  04/28/2021 33.0 26.0 - 34.0 pg Final   MCV  Date Value Ref Range Status  03/26/2023 98 (H) 79 - 97 fL Final   No results found  for: PLTCOUNTKUC, LABPLAT, POCPLA RDW  Date Value Ref Range Status  03/26/2023 14.1 11.7 - 15.4 % Final

## 2023-07-08 NOTE — Telephone Encounter (Signed)
 Requested medication (s) are due for refill today: ye  Requested medication (s) are on the active medication list: yes  Last refill:  05/07/23  Future visit scheduled: yes  Notes to clinic:  Medication not assigned to a protocol, review manually.      Requested Prescriptions  Pending Prescriptions Disp Refills   MOUNJARO  10 MG/0.5ML Pen [Pharmacy Med Name: Mounjaro  10 MG/0.5ML Subcutaneous Solution Pen-injector] 12 mL 0    Sig: INJECT ONE SYRINGEFUL (10MG ) INTO THE SKIN ONCE WEEKLY     Off-Protocol Failed - 07/08/2023 11:17 AM      Failed - Medication not assigned to a protocol, review manually.      Passed - Valid encounter within last 12 months    Recent Outpatient Visits           2 months ago Primary hypertension   [place] [facility] [place], [place], DO   3 months ago Primary hypertension   [place] [facility] [place], [place]

## 2023-07-14 ENCOUNTER — Ambulatory Visit: Admitting: Family Medicine

## 2023-07-14 ENCOUNTER — Encounter: Payer: Self-pay | Admitting: Family Medicine

## 2023-07-14 VITALS — BP 115/72 | HR 81 | Temp 97.8°F | Ht 69.0 in | Wt 155.0 lb

## 2023-07-14 DIAGNOSIS — E1142 Type 2 diabetes mellitus with diabetic polyneuropathy: Secondary | ICD-10-CM

## 2023-07-14 DIAGNOSIS — I10 Essential (primary) hypertension: Secondary | ICD-10-CM | POA: Diagnosis not present

## 2023-07-14 DIAGNOSIS — E538 Deficiency of other specified B group vitamins: Secondary | ICD-10-CM

## 2023-07-14 DIAGNOSIS — E039 Hypothyroidism, unspecified: Secondary | ICD-10-CM | POA: Diagnosis not present

## 2023-07-14 DIAGNOSIS — M1A9XX0 Chronic gout, unspecified, without tophus (tophi): Secondary | ICD-10-CM

## 2023-07-14 DIAGNOSIS — B351 Tinea unguium: Secondary | ICD-10-CM | POA: Diagnosis not present

## 2023-07-14 DIAGNOSIS — Z0000 Encounter for general adult medical examination without abnormal findings: Secondary | ICD-10-CM

## 2023-07-14 DIAGNOSIS — E785 Hyperlipidemia, unspecified: Secondary | ICD-10-CM | POA: Diagnosis not present

## 2023-07-14 DIAGNOSIS — Z8673 Personal history of transient ischemic attack (TIA), and cerebral infarction without residual deficits: Secondary | ICD-10-CM

## 2023-07-14 DIAGNOSIS — Z23 Encounter for immunization: Secondary | ICD-10-CM | POA: Diagnosis not present

## 2023-07-14 LAB — MICROALBUMIN, URINE WAIVED
Creatinine, Urine Waived: 50 mg/dL (ref 10–300)
Microalb, Ur Waived: 150 mg/L — ABNORMAL HIGH (ref 0–19)
Microalb/Creat Ratio: >300 mg/g — ABNORMAL HIGH (ref <30)

## 2023-07-14 LAB — BAYER DCA HB A1C WAIVED: HB A1C (BAYER DCA - WAIVED): 4.8 % (ref 4.8–5.6)

## 2023-07-14 MED ORDER — PROPRANOLOL HCL 20 MG PO TABS: 20.0000 mg | ORAL_TABLET | Freq: Every day | ORAL | 1 refills | Status: AC | PRN

## 2023-07-14 MED ORDER — MECLIZINE HCL 25 MG PO TABS: 25.0000 mg | ORAL_TABLET | Freq: Three times a day (TID) | ORAL | 1 refills | Status: DC | PRN

## 2023-07-14 MED ORDER — MOUNJARO 10 MG/0.5ML ~~LOC~~ SOAJ: 10.0000 mg | SUBCUTANEOUS | 1 refills | Status: DC

## 2023-07-14 MED ORDER — HYDROCHLOROTHIAZIDE 25 MG PO TABS: 25.0000 mg | ORAL_TABLET | Freq: Every day | ORAL | 1 refills | Status: AC

## 2023-07-14 NOTE — Progress Notes (Signed)
 BP 115/72 (BP Location: Left Arm, Patient Position: Sitting, Cuff Size: Normal)   Pulse 81   Temp 97.8 F (36.6 C) (Oral)   Ht 5' 9 (1.753 m)   Wt 155 lb (70.3 kg)   SpO2 97%   BMI 22.89 kg/m    Subjective:    Patient ID: Christie Bryant, female    DOB: Sep 16, 1955, 68 y.o.   MRN: [id number]  HPI: Christie Bryant is a 68 y.o. female presenting on 07/14/2023 for comprehensive medical examination. Current medical complaints include:  DIABETES Hypoglycemic episodes:no Polydipsia/polyuria: no Visual disturbance: no Chest pain: no Paresthesias: yes Glucose Monitoring: yes Taking Insulin ?: no Blood Pressure Monitoring: rarely Retinal Examination: Up to Date Foot Exam: Up to Date Diabetic Education: Completed Pneumovax: Up to Date Influenza: Up to Date Aspirin : yes  HYPERTENSION / HYPERLIPIDEMIA Satisfied with current treatment? yes Duration of hypertension: chronic BP monitoring frequency: rarely BP medication side effects: no Past BP meds: HCTZ Duration of hyperlipidemia: chronic Cholesterol medication side effects: no Cholesterol supplements: none Past cholesterol medications: atorvastatin  Medication compliance: excellent compliance Aspirin : no Recent stressors: no Recurrent headaches: no Visual changes: no Palpitations: no Dyspnea: no Chest pain: no Lower extremity edema: no Dizzy/lightheaded: no  No gout flares. Tolerating medicine well  HYPOTHYROIDISM Thyroid  control status:controlled Satisfied with current treatment? yes Medication side effects: no Medication compliance: excellent compliance Recent dose adjustment:no Fatigue: no Cold intolerance: no Heat intolerance: no Weight gain: no Weight loss: no Constipation: no Diarrhea/loose stools: no Palpitations: no Lower extremity edema: no Anxiety/depressed mood: no   She currently lives with: alone- moving in with her son and his family at the end of the month Menopausal Symptoms: no  Depression  Screen done today and results listed below:     04/20/2023    3:20 PM 02/25/2023    4:10 PM 06/23/2022    2:08 PM 02/20/2022    3:21 PM 02/16/2022    3:20 PM  Depression screen PHQ 2/9  Decreased Interest 1 0 0 0 0  Down, Depressed, Hopeless 1 1 0 0 0  PHQ - 2 Score 2 1 0 0 0  Altered sleeping 1  3 0 0  Tired, decreased energy 1  0 0 0  Change in appetite 2  0 0 0  Feeling bad or failure about yourself  0  0 0 0  Trouble concentrating 0  0 0 0  Moving slowly or fidgety/restless 0  0 0 0  Suicidal thoughts 0  0 0 0  PHQ-9 Score 6  3 0 0  Difficult doing work/chores Not difficult at all  Not difficult at all Not difficult at all Not difficult at all    Past Medical History:  Past Medical History:  Diagnosis Date   Chicken pox    Chronic kidney disease    Stones   Diabetes mellitus 2012   Dr. Damian   GERD (gastroesophageal reflux disease)    Hyperlipidemia    Hypertension    Neuromuscular disorder (HCC) 2018   Recurrent boils    Stroke (HCC) 04/24/2021   Thyroid  disease     Surgical History:  Past Surgical History:  Procedure Laterality Date   ABDOMINAL HYSTERECTOMY  1998   BIOPSY THYROID      normal, Dr. Damian   BREAST REDUCTION SURGERY     BREAST SURGERY  1981   COLONOSCOPY WITH PROPOFOL  N/A 08/13/2014   Procedure: COLONOSCOPY WITH PROPOFOL ;  Surgeon: Deward CINDERELLA Piedmont, MD;  Location: [facility] ENDOSCOPY;  Service:  Gastroenterology;  Laterality: N/A;   COLONOSCOPY WITH PROPOFOL  N/A 11/12/2014   Procedure: COLONOSCOPY WITH PROPOFOL ;  Surgeon: Deward CINDERELLA Piedmont, MD;  Location: [facility] ENDOSCOPY;  Service: Gastroenterology;  Laterality: N/A;   COSMETIC SURGERY     EYE SURGERY  1990s   gluteal abscess drainage  03/17/2013   TOTAL ABDOMINAL HYSTERECTOMY W/ BILATERAL SALPINGOOPHORECTOMY  01/13/1996   complete   TUBAL LIGATION  1996    Medications:  Current Outpatient Medications on File Prior to Visit  Medication Sig   acetaminophen  (TYLENOL ) 325 MG tablet Take 2 tablets (650 mg total) by  mouth every 6 (six) hours as needed for mild pain (or Fever >/= 101).   allopurinol  (ZYLOPRIM ) 100 MG tablet Take 1 tablet (100 mg total) by mouth daily.   aspirin  81 MG chewable tablet Chew 1 tablet (81 mg total) by mouth daily.   atorvastatin  (LIPITOR) 80 MG tablet TAKE 1 TABLET BY MOUTH AT BEDTIME   Cholecalciferol (VITAMIN D -3 PO) Take 2,000 Units by mouth daily.    glucose blood (ONETOUCH ULTRA) test strip 1 each by Other route as needed for other. Testing 3-4x a day   Insulin  Pen Needle (PEN NEEDLES 3/16) 31G X 5 MM MISC 1 each by Does not apply route once a week.   levothyroxine  (SYNTHROID ) 125 MCG tablet TAKE 1 TABLET BY MOUTH ON AN EMPTY STOMACH WITH  A  GLASS  OF  WATER  AT  LEAST  30-60  MINUTES  BEFORE  BREAKFAST   omeprazole (PRILOSEC) 20 MG capsule Take 20 mg by mouth daily. (Patient taking differently: Take 20 mg by mouth daily. PRN)   No current facility-administered medications on file prior to visit.    Allergies:  No Known Allergies  Social History:  Social History   Socioeconomic History   Marital status: Married    Spouse name: Charles   Number of children: 2   Years of education: Not on file   Highest education level: Some college, no degree  Occupational History   Occupation: retired  Tobacco Use   Smoking status: Never   Smokeless tobacco: Never  Vaping Use   Vaping status: Never Used  Substance and Sexual Activity   Alcohol use: No   Drug use: No   Sexual activity: Not Currently  Other Topics Concern   Not on file  Social History Narrative   Lives in [place] with husband, from [place].    Work - Dispensing optician - retired   Teacher, early years/pre Strain: Low Risk  (07/10/2023)   Overall Financial Resource Strain (CARDIA)    Difficulty of Paying Living Expenses: Not very hard  Food Insecurity: No Food Insecurity (07/10/2023)   Hunger Vital Sign    Worried About Running Out of Food in the Last Year: Never true    Ran Out  of Food in the Last Year: Never true  Transportation Needs: No Transportation Needs (07/10/2023)   PRAPARE - Administrator, Civil Service (Medical): No    Lack of Transportation (Non-Medical): No  Physical Activity: Insufficiently Active (07/10/2023)   Exercise Vital Sign    Days of Exercise per Week: 1 day    Minutes of Exercise per Session: 10 min  Stress: Stress Concern Present (07/10/2023)   [employer] of Occupational Health - Occupational Stress Questionnaire    Feeling of Stress: To some extent  Social Connections: Moderately Integrated (07/10/2023)   Social Connection and Isolation Panel    Frequency of  Communication with Friends and Family: Three times a week    Frequency of Social Gatherings with Friends and Family: Once a week    Attends Religious Services: More than 4 times per year    Active Member of [employer] or Organizations: Yes    Attends Banker Meetings: More than 4 times per year    Marital Status: Widowed  Intimate Partner Violence: Not At Risk (02/25/2023)   Humiliation, Afraid, Rape, and Kick questionnaire    Fear of Current or Ex-Partner: No    Emotionally Abused: No    Physically Abused: No    Sexually Abused: No   Social History   Tobacco Use  Smoking Status Never  Smokeless Tobacco Never   Social History   Substance and Sexual Activity  Alcohol Use No    Family History:  Family History  Problem Relation Age of Onset   Cancer Mother        ovarian/uterus   Hyperlipidemia Father    Heart disease Father    Hypertension Father    Cancer Father        throat   Vision loss Father    Hypertension Sister    Early death Sister    Hypertension Sister    Hypertension Sister    Liver disease Sister    Heart disease Brother    Hypertension Brother    Stroke Maternal Grandmother    Hypertension Maternal Grandmother    Hypertension Maternal Grandfather    Pancreatic cancer Maternal Grandfather    Stroke Maternal  Grandfather    Stroke Paternal Grandmother    Stroke Paternal Grandfather    Hypertension Other    Heart disease Other    Sudden death Other    Diabetes Other     Past medical history, surgical history, medications, allergies, family history and social history reviewed with patient today and changes made to appropriate areas of the chart.   Review of Systems  Constitutional: Negative.   HENT: Negative.    Eyes: Negative.   Respiratory: Negative.    Cardiovascular:  Positive for leg swelling. Negative for chest pain, palpitations, orthopnea, claudication and PND.  Gastrointestinal:  Positive for heartburn (x1 after eating pizza). Negative for abdominal pain, blood in stool, constipation, diarrhea, melena, nausea and vomiting.  Genitourinary: Negative.   Musculoskeletal: Negative.   Skin: Negative.   Neurological:  Positive for dizziness. Negative for tingling, tremors, sensory change, speech change, focal weakness, seizures, loss of consciousness, weakness and headaches.  Endo/Heme/Allergies: Negative.   Psychiatric/Behavioral: Negative.     All other ROS negative except what is listed above and in the HPI.      Objective:    BP 115/72 (BP Location: Left Arm, Patient Position: Sitting, Cuff Size: Normal)   Pulse 81   Temp 97.8 F (36.6 C) (Oral)   Ht 5' 9 (1.753 m)   Wt 155 lb (70.3 kg)   SpO2 97%   BMI 22.89 kg/m   Wt Readings from Last 3 Encounters:  07/14/23 155 lb (70.3 kg)  04/20/23 187 lb 12.8 oz (85.2 kg)  02/25/23 194 lb (88 kg)    Physical Exam Vitals and nursing note reviewed.  Constitutional:      General: She is not in acute distress.    Appearance: Normal appearance. She is not ill-appearing, toxic-appearing or diaphoretic.  HENT:     Head: Normocephalic and atraumatic.     Right Ear: Tympanic membrane, ear canal and external ear normal. There is no impacted  cerumen.     Left Ear: Tympanic membrane, ear canal and external ear normal. There is no  impacted cerumen.     Nose: Nose normal. No congestion or rhinorrhea.     Mouth/Throat:     Mouth: Mucous membranes are moist.     Pharynx: Oropharynx is clear. No oropharyngeal exudate or posterior oropharyngeal erythema.  Eyes:     General: No scleral icterus.       Right eye: No discharge.        Left eye: No discharge.     Extraocular Movements: Extraocular movements intact.     Conjunctiva/sclera: Conjunctivae normal.     Pupils: Pupils are equal, round, and reactive to light.  Neck:     Vascular: No carotid bruit.  Cardiovascular:     Rate and Rhythm: Normal rate and regular rhythm.     Pulses: Normal pulses.     Heart sounds: No murmur heard.    No friction rub. No gallop.  Pulmonary:     Effort: Pulmonary effort is normal. No respiratory distress.     Breath sounds: Normal breath sounds. No stridor. No wheezing, rhonchi or rales.  Chest:     Chest wall: No tenderness.  Abdominal:     General: Abdomen is flat. Bowel sounds are normal. There is no distension.     Palpations: Abdomen is soft. There is no mass.     Tenderness: There is no abdominal tenderness. There is no right CVA tenderness, left CVA tenderness, guarding or rebound.     Hernia: No hernia is present.  Genitourinary:    Comments: Breast and pelvic exams deferred with shared decision making Musculoskeletal:        General: No swelling, tenderness, deformity or signs of injury.     Cervical back: Normal range of motion and neck supple. No rigidity. No muscular tenderness.     Right lower leg: No edema.     Left lower leg: No edema.  Lymphadenopathy:     Cervical: No cervical adenopathy.  Skin:    General: Skin is warm and dry.     Capillary Refill: Capillary refill takes less than 2 seconds.     Coloration: Skin is not jaundiced or pale.     Findings: No bruising, erythema, lesion or rash.  Neurological:     General: No focal deficit present.     Mental Status: She is alert and oriented to person,  place, and time. Mental status is at baseline.     Cranial Nerves: No cranial nerve deficit.     Sensory: No sensory deficit.     Motor: No weakness.     Coordination: Coordination normal.     Gait: Gait normal.     Deep Tendon Reflexes: Reflexes normal.  Psychiatric:        Mood and Affect: Mood normal.        Behavior: Behavior normal.        Thought Content: Thought content normal.        Judgment: Judgment normal.     Results for orders placed or performed in visit on 04/20/23  Basic metabolic panel with GFR   Collection Time: 04/20/23  3:30 PM  Result Value Ref Range   Glucose 123 (H) 70 - 99 mg/dL   BUN 15 8 - 27 mg/dL   Creatinine, Ser 8.76 (H) 0.57 - 1.00 mg/dL   eGFR 48 (L) >40 fO/fpw/8.26   BUN/Creatinine Ratio 12 12 - 28   Sodium 141  134 - 144 mmol/L   Potassium 3.7 3.5 - 5.2 mmol/L   Chloride 100 96 - 106 mmol/L   CO2 19 (L) 20 - 29 mmol/L   Calcium  10.3 8.7 - 10.3 mg/dL      Assessment & Plan:   Problem List Items Addressed This Visit       Cardiovascular and Mediastinum   Hypertension (Chronic)   Under good control on current regimen. Continue current regimen. Continue to monitor. Call with any concerns. Refills given. Labs drawn today.        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 Tolerating mounjaro  well. Will stop her metformin  and recheck in 3 months. Call with any concerns.       Relevant Medications   tirzepatide  (MOUNJARO ) 10 MG/0.5ML Pen   Other Relevant Orders   Bayer DCA Hb A1c Waived   CBC with Differential/Platelet   Comprehensive metabolic panel with GFR   Microalbumin, Urine Waived   Neuropathy in diabetes (HCC)   Will continue  to watch her feet. Call with any concerns.       Relevant Medications   tirzepatide  (MOUNJARO ) 10 MG/0.5ML Pen   Other Relevant Orders   CBC with Differential/Platelet   Comprehensive metabolic panel with GFR   Acquired hypothyroidism   Rechecking labs today. Await results. Treat as needed.       Relevant Medications   propranolol  (INDERAL ) 20 MG tablet   Other Relevant Orders   CBC with Differential/Platelet   Comprehensive metabolic panel with GFR   TSH     Other   Hyperlipidemia with target low density lipoprotein (LDL) cholesterol less than 70 mg/dL   Under good control on current regimen. Continue current regimen. Continue to monitor. Call with any concerns. Refills given. Labs drawn today.       Relevant Medications   hydrochlorothiazide  (HYDRODIURIL ) 25 MG tablet   propranolol  (INDERAL ) 20 MG tablet   Other Relevant Orders   CBC with Differential/Platelet   Comprehensive metabolic panel with GFR   Lipid Panel w/o Chol/HDL Ratio   Gout   Under good control on current regimen. Continue current regimen. Continue to monitor. Call with any concerns. Refills given. Labs drawn today.       Relevant Orders   CBC with Differential/Platelet   Comprehensive metabolic panel with GFR   Uric acid   History of cerebellar stroke   Continues with issues with balance. Will refer to physical therapy. Call with any concerns.       Relevant Orders   Ambulatory referral to Physical Therapy   B12 deficiency   Rechecking labs today. Await results. Treat as needed.       Relevant Orders   CBC with Differential/Platelet   Comprehensive metabolic panel with GFR   B12   Other Visit Diagnoses       Routine general medical examination at a health care facility    -  Primary   Vaccines up to date. Screening labs checked today. Mammo, colonoscopy and DEXA up to date. Continue diet and exercise. Call with any concerns.     Onychomycosis of great toe       Referral to podiatry placed  today.   Relevant Orders   Ambulatory referral to Podiatry     Need for COVID-19 vaccine       Covid shot given today.   Relevant Orders   Pfizer Comirnaty Covid -19 Vaccine 27yrs and older        Follow up plan: Return in about 3 months (around 10/14/2023).   LABORATORY TESTING:  - Pap smear: not applicable  IMMUNIZATIONS:   - Tdap: Tetanus vaccination status reviewed: last tetanus booster within 10 years. - Influenza: Up to date - Pneumovax: Up to date - Prevnar: Up to date - COVID: Administered today - HPV: Not applicable - Shingrix vaccine: Refused  SCREENING: -Mammogram: Up to date  - Colonoscopy: Up to date  - Bone Density: Up to date   PATIENT COUNSELING:   Advised to take 1 mg of folate supplement per day if capable of pregnancy.   Sexuality: Discussed sexually transmitted diseases, partner selection, use of condoms, avoidance of unintended pregnancy  and contraceptive alternatives.   Advised to avoid cigarette smoking.  I discussed with the patient that most people either abstain from alcohol or drink within safe limits (<=14/week and <=4 drinks/occasion for males, <=7/weeks and <= 3 drinks/occasion for females) and that the risk for alcohol disorders and other health effects rises proportionally with the number of drinks per week and how often a drinker exceeds daily limits.  Discussed cessation/primary prevention of drug use and availability of treatment for abuse.   Diet: Encouraged to adjust caloric intake to maintain  or achieve ideal body weight, to reduce intake of dietary saturated fat and total fat, to limit sodium intake by avoiding high sodium foods and not adding table salt, and to maintain adequate dietary potassium and calcium  preferably from fresh fruits, vegetables, and low-fat dairy products.    stressed the importance of regular exercise  Injury prevention: Discussed safety belts, safety helmets, smoke detector, smoking near bedding or  upholstery.   Dental health: Discussed importance of regular tooth brushing, flossing, and dental visits.    NEXT PREVENTATIVE PHYSICAL DUE IN 1 YEAR. Return in about 3 months (around 10/14/2023).

## 2023-07-14 NOTE — Assessment & Plan Note (Signed)
 Under good control on current regimen. Continue current regimen. Continue to monitor. Call with any concerns. Refills given. Labs drawn today.

## 2023-07-14 NOTE — Assessment & Plan Note (Signed)
 Rechecking labs today. Await results. Treat as needed.

## 2023-07-14 NOTE — Assessment & Plan Note (Signed)
 Will continue to watch her feet. Call with any concerns.

## 2023-07-14 NOTE — Assessment & Plan Note (Signed)
 Sugars down to 4.8 from 7.3! Tolerating mounjaro  well. Will stop her metformin  and recheck in 3 months. Call with any concerns.

## 2023-07-14 NOTE — Assessment & Plan Note (Signed)
 Continues with issues with balance. Will refer to physical therapy. Call with any concerns.

## 2023-07-18 ENCOUNTER — Other Ambulatory Visit: Payer: Self-pay | Admitting: Family Medicine

## 2023-07-18 ENCOUNTER — Ambulatory Visit: Payer: Self-pay | Admitting: Family Medicine

## 2023-07-18 MED ORDER — ALLOPURINOL 200 MG PO TABS: 200.0000 mg | ORAL_TABLET | Freq: Every day | ORAL | 3 refills | Status: DC

## 2023-07-20 NOTE — Telephone Encounter (Signed)
 Requested medication (s) are due for refill today: Yes  Requested medication (s) are on the active medication list: Yes  Last refill:  07/18/23  Future visit scheduled: Yes  Notes to clinic:  Unable to refill per protocol due to pharmacy request for an alternative medication.      Requested Prescriptions  Pending Prescriptions Disp Refills   Allopurinol  200 MG TABS [Pharmacy Med Name: ALLOPURINOL  200MG    TAB] 30 tablet 3    Sig: TAKE 1 TABLET BY MOUTH ONCE DAILY     Endocrinology:  Gout Agents - allopurinol  Failed - 07/20/2023  3:42 PM      Failed - Uric Acid in normal range and within 360 days    Uric Acid  Date Value Ref Range Status  07/14/2023 7.6 (H) 3.0 - 7.2 mg/dL Final    Comment:               Therapeutic target for gout patients: <6.0         Failed - Cr in normal range and within 360 days    Creatinine, Ser  Date Value Ref Range Status  07/14/2023 1.20 (H) 0.57 - 1.00 mg/dL Final         Failed - CBC within normal limits and completed in the last 12 months    WBC  Date Value Ref Range Status  07/14/2023 4.5 3.4 - 10.8 x10E3/uL Final  04/28/2021 7.6 4.0 - 10.5 K/uL Final   RBC  Date Value Ref Range Status  07/14/2023 3.40 (L) 3.77 - 5.28 x10E6/uL Final  04/28/2021 4.67 3.87 - 5.11 MIL/uL Final   Hemoglobin  Date Value Ref Range Status  07/14/2023 11.5 11.1 - 15.9 g/dL Final   Hematocrit  Date Value Ref Range Status  07/14/2023 34.5 34.0 - 46.6 % Final   MCHC  Date Value Ref Range Status  07/14/2023 33.3 31.5 - 35.7 g/dL Final  95/82/7976 64.7 30.0 - 36.0 g/dL Final   [facility]  Date Value Ref Range Status  07/14/2023 33.8 (H) 26.6 - 33.0 pg Final  04/28/2021 33.0 26.0 - 34.0 pg Final   MCV  Date Value Ref Range Status  07/14/2023 102 (H) 79 - 97 fL Final   No results found for: PLTCOUNTKUC, LABPLAT, POCPLA RDW  Date Value Ref Range Status  07/14/2023 13.1 11.7 - 15.4 % Final         Passed - Valid encounter within last 12 months     Recent Outpatient Visits           6 days ago Routine general medical examination at a health care facility   [facility] [place], Megan P, DO   3 months ago Primary hypertension   [place] [facility] [place], [place], DO   3 months ago Primary hypertension   [place] [facility] [place], [place], DO

## 2023-07-21 ENCOUNTER — Telehealth: Payer: Self-pay

## 2023-07-21 ENCOUNTER — Other Ambulatory Visit: Payer: Self-pay

## 2023-07-21 ENCOUNTER — Other Ambulatory Visit (HOSPITAL_COMMUNITY): Payer: Self-pay

## 2023-07-21 NOTE — Telephone Encounter (Signed)
-----   Message from [facility] [place] [facility] sent at 07/21/2023  8:14 AM EDT ----- Hi team,  Do you mind looking to see if a PA for allopurinol  200 MG TABS once daily is possible versus patient needing to take 2 100 mg tabs.   If you don't mind letting me know, Thank you!

## 2023-07-21 NOTE — Telephone Encounter (Signed)
 Pharmacy Patient Advocate Encounter   Received notification from Physician's Office that prior authorization for Allopurinol  200mg  tabs is required/requested.   Insurance verification completed.   The patient is insured through [facility] ADVANTAGE/RX ADVANCE .   Per test claim:  2 tablets of 100mg  allopurinol  is preferred by the insurance.  If suggested medication is appropriate, Please send in a new RX and discontinue this one. If not, please advise as to why it's not appropriate so that we may request a Prior Authorization. Please note, some preferred medications may still require a PA.  If the suggested medications have not been trialed and there are no contraindications to their use, the PA will not be submitted, as it will not be approved.

## 2023-07-22 MED ORDER — ALLOPURINOL 100 MG PO TABS: 200.0000 mg | ORAL_TABLET | Freq: Every day | ORAL | 0 refills | Status: AC

## 2023-07-23 LAB — COMPREHENSIVE METABOLIC PANEL WITH GFR
ALT: 18 IU/L (ref 0–32)
AST: 27 IU/L (ref 0–40)
Albumin: 4.2 g/dL (ref 3.9–4.9)
Alkaline Phosphatase: 63 IU/L (ref 44–121)
BUN/Creatinine Ratio: 20 (ref 12–28)
BUN: 24 mg/dL (ref 8–27)
Bilirubin Total: 1.7 mg/dL — ABNORMAL HIGH (ref 0.0–1.2)
CO2: 19 mmol/L — ABNORMAL LOW (ref 20–29)
Calcium: 10.2 mg/dL (ref 8.7–10.3)
Chloride: 100 mmol/L (ref 96–106)
Creatinine, Ser: 1.2 mg/dL — ABNORMAL HIGH (ref 0.57–1.00)
Globulin, Total: 2.8 g/dL (ref 1.5–4.5)
Glucose: 98 mg/dL (ref 70–99)
Potassium: 3.3 mmol/L — ABNORMAL LOW (ref 3.5–5.2)
Sodium: 138 mmol/L (ref 134–144)
Total Protein: 7 g/dL (ref 6.0–8.5)
eGFR: 49 mL/min/1.73 — ABNORMAL LOW (ref >59)

## 2023-07-23 LAB — CBC WITH DIFFERENTIAL/PLATELET
Basophils Absolute: 0 x10E3/uL (ref 0.0–0.2)
Basos: 0 %
EOS (ABSOLUTE): 0.1 x10E3/uL (ref 0.0–0.4)
Eos: 3 %
Hematocrit: 34.5 % (ref 34.0–46.6)
Hemoglobin: 11.5 g/dL (ref 11.1–15.9)
Immature Grans (Abs): 0 x10E3/uL (ref 0.0–0.1)
Immature Granulocytes: 0 %
Lymphocytes Absolute: 1.4 x10E3/uL (ref 0.7–3.1)
Lymphs: 32 %
MCH: 33.8 pg — ABNORMAL HIGH (ref 26.6–33.0)
MCHC: 33.3 g/dL (ref 31.5–35.7)
MCV: 102 fL — ABNORMAL HIGH (ref 79–97)
Monocytes Absolute: 0.4 x10E3/uL (ref 0.1–0.9)
Monocytes: 10 %
Neutrophils Absolute: 2.5 x10E3/uL (ref 1.4–7.0)
Neutrophils: 55 %
Platelets: 175 x10E3/uL (ref 150–450)
RBC: 3.4 x10E6/uL — ABNORMAL LOW (ref 3.77–5.28)
RDW: 13.1 % (ref 11.7–15.4)
WBC: 4.5 x10E3/uL (ref 3.4–10.8)

## 2023-07-23 LAB — LIPID PANEL W/O CHOL/HDL RATIO
Cholesterol, Total: 84 mg/dL — ABNORMAL LOW (ref 100–199)
HDL: 25 mg/dL — ABNORMAL LOW (ref >39)
LDL Chol Calc (NIH): 42 mg/dL (ref 0–99)
Triglycerides: 85 mg/dL (ref 0–149)
VLDL Cholesterol Cal: 17 mg/dL (ref 5–40)

## 2023-07-23 LAB — URIC ACID: Uric Acid: 7.6 mg/dL — ABNORMAL HIGH (ref 3.0–7.2)

## 2023-07-23 LAB — VITAMIN B12: Vitamin B-12: 558 pg/mL (ref 232–1245)

## 2023-07-23 LAB — TSH: TSH: 0.063 u[IU]/mL — ABNORMAL LOW (ref 0.450–4.500)

## 2023-08-03 ENCOUNTER — Ambulatory Visit: Admitting: Family Medicine

## 2023-08-04 MED ORDER — LEVOTHYROXINE SODIUM 112 MCG PO TABS: 112.0000 ug | ORAL_TABLET | Freq: Every day | ORAL | 0 refills | Status: DC

## 2023-08-26 ENCOUNTER — Encounter: Payer: Self-pay | Admitting: Family Medicine

## 2023-08-27 ENCOUNTER — Other Ambulatory Visit: Payer: Self-pay | Admitting: Family Medicine

## 2023-08-27 ENCOUNTER — Other Ambulatory Visit

## 2023-08-27 DIAGNOSIS — M1A9XX0 Chronic gout, unspecified, without tophus (tophi): Secondary | ICD-10-CM

## 2023-08-27 DIAGNOSIS — E039 Hypothyroidism, unspecified: Secondary | ICD-10-CM

## 2023-08-28 LAB — TSH: TSH: 0.087 u[IU]/mL — ABNORMAL LOW (ref 0.450–4.500)

## 2023-08-28 LAB — URIC ACID: Uric Acid: 4.6 mg/dL (ref 3.0–7.2)

## 2023-08-29 ENCOUNTER — Ambulatory Visit: Payer: Self-pay | Admitting: Family Medicine

## 2023-08-29 DIAGNOSIS — E039 Hypothyroidism, unspecified: Secondary | ICD-10-CM

## 2023-08-29 MED ORDER — LEVOTHYROXINE SODIUM 100 MCG PO TABS: 100.0000 ug | ORAL_TABLET | Freq: Every day | ORAL | 0 refills | Status: AC

## 2023-09-02 MED ORDER — MOUNJARO 10 MG/0.5ML ~~LOC~~ SOAJ: 10.0000 mg | SUBCUTANEOUS | 1 refills | Status: AC

## 2023-09-15 ENCOUNTER — Other Ambulatory Visit

## 2023-09-15 ENCOUNTER — Encounter: Payer: Self-pay | Admitting: Family Medicine

## 2023-09-22 ENCOUNTER — Other Ambulatory Visit (HOSPITAL_COMMUNITY): Payer: Self-pay

## 2023-09-27 ENCOUNTER — Other Ambulatory Visit (HOSPITAL_COMMUNITY): Payer: Self-pay

## 2023-10-14 ENCOUNTER — Ambulatory Visit: Admitting: Family Medicine

## 2023-11-03 ENCOUNTER — Other Ambulatory Visit: Payer: Self-pay | Admitting: Family Medicine

## 2023-11-04 NOTE — Telephone Encounter (Signed)
 Requested medications are due for refill today.  yes  Requested medications are on the active medications list.  yes  Last refill. 07/14/2023 #90 1 rf  Future visit scheduled.   no  Notes to clinic.  Refill not delegated.    Requested Prescriptions  Pending Prescriptions Disp Refills   meclizine  (ANTIVERT ) 25 MG tablet [Pharmacy Med Name: MECLIZINE  25 MG TABLET] 90 tablet 1    Sig: TAKE 1 TABLET BY MOUTH 3 TIMES DAILY AS NEEDED FOR DIZZINESS     Not Delegated - Gastroenterology: Antiemetics Failed - 11/04/2023  4:42 PM      Failed - This refill cannot be delegated      Passed - Valid encounter within last 6 months    Recent Outpatient Visits           3 months ago Routine general medical examination at a health care facility   [facility], Megan P, DO   6 months ago Primary hypertension   [place] [facility] [place], Megan P, DO   7 months ago Primary hypertension   [place] [facility] [place], [place], DO

## 2023-11-23 ENCOUNTER — Telehealth: Payer: Self-pay

## 2023-11-23 NOTE — Progress Notes (Signed)
   11/23/2023  Patient ID: Christie Bryant, female   DOB: January 28, 1955, 68 y.o.   MRN: [id number]  This patient is appearing on a report for being at risk of failing the adherence measure for cholesterol (statin) medications this calendar year.   Medication: atorvastatin  80mg    Last fill date: 11/17/2023 for 90 day supply  Insurance report was not up to date. No action needed at this time.   Channing DELENA Mealing, PharmD, DPLA

## 2023-11-29 ENCOUNTER — Other Ambulatory Visit (HOSPITAL_COMMUNITY): Payer: Self-pay
